# Patient Record
Sex: Female | Born: 1940 | Race: White | Hispanic: No | Smoking: Never smoker
Health system: Southern US, Community
[De-identification: ages and names within clinical notes are randomized; demographics above are authoritative.]

## PROBLEM LIST (undated history)

## (undated) DIAGNOSIS — E538 Deficiency of other specified B group vitamins: Secondary | ICD-10-CM

## (undated) DIAGNOSIS — C801 Malignant (primary) neoplasm, unspecified: Secondary | ICD-10-CM

## (undated) DIAGNOSIS — C50919 Malignant neoplasm of unspecified site of unspecified female breast: Principal | ICD-10-CM

## (undated) DIAGNOSIS — C7951 Secondary malignant neoplasm of bone: Secondary | ICD-10-CM

## (undated) HISTORY — DX: Malignant neoplasm of unspecified site of unspecified female breast: C50.919

## (undated) HISTORY — DX: Secondary malignant neoplasm of bone: C79.51

## (undated) HISTORY — DX: Deficiency of other specified B group vitamins: E53.8

---

## 2005-06-16 ENCOUNTER — Encounter: Admission: RE | Admit: 2005-06-16 | Discharge: 2005-06-16 | Payer: Self-pay | Admitting: Hematology and Oncology

## 2005-07-18 ENCOUNTER — Ambulatory Visit: Admission: RE | Admit: 2005-07-18 | Discharge: 2005-08-15 | Payer: Self-pay | Admitting: Radiation Oncology

## 2005-11-11 ENCOUNTER — Ambulatory Visit: Admission: RE | Admit: 2005-11-11 | Discharge: 2006-01-03 | Payer: Self-pay | Admitting: Radiation Oncology

## 2011-01-06 ENCOUNTER — Other Ambulatory Visit (HOSPITAL_COMMUNITY): Payer: Self-pay | Admitting: Hematology and Oncology

## 2011-01-06 DIAGNOSIS — C50919 Malignant neoplasm of unspecified site of unspecified female breast: Secondary | ICD-10-CM

## 2011-01-20 ENCOUNTER — Encounter (HOSPITAL_COMMUNITY): Payer: Self-pay

## 2011-01-20 ENCOUNTER — Other Ambulatory Visit (HOSPITAL_COMMUNITY): Payer: Self-pay | Admitting: Hematology and Oncology

## 2011-01-20 ENCOUNTER — Encounter (HOSPITAL_COMMUNITY)
Admission: RE | Admit: 2011-01-20 | Discharge: 2011-01-20 | Disposition: A | Discharge status: Home / Self Care | Payer: Medicare Other | Source: Ambulatory Visit | Attending: Hematology and Oncology | Admitting: Hematology and Oncology

## 2011-01-20 DIAGNOSIS — Z9221 Personal history of antineoplastic chemotherapy: Secondary | ICD-10-CM | POA: Insufficient documentation

## 2011-01-20 DIAGNOSIS — C7951 Secondary malignant neoplasm of bone: Secondary | ICD-10-CM | POA: Insufficient documentation

## 2011-01-20 DIAGNOSIS — Z923 Personal history of irradiation: Secondary | ICD-10-CM | POA: Insufficient documentation

## 2011-01-20 DIAGNOSIS — C7952 Secondary malignant neoplasm of bone marrow: Secondary | ICD-10-CM | POA: Insufficient documentation

## 2011-01-20 DIAGNOSIS — C50919 Malignant neoplasm of unspecified site of unspecified female breast: Secondary | ICD-10-CM

## 2011-01-20 HISTORY — DX: Malignant (primary) neoplasm, unspecified: C80.1

## 2011-01-20 MED ORDER — FLUDEOXYGLUCOSE F - 18 (FDG) INJECTION
13.0000 | Freq: Once | INTRAVENOUS | Status: AC | PRN
  Administered 2011-01-20: 13 via INTRAVENOUS

## 2011-03-16 ENCOUNTER — Other Ambulatory Visit (HOSPITAL_COMMUNITY): Payer: Self-pay | Admitting: Hematology and Oncology

## 2011-03-16 DIAGNOSIS — C50919 Malignant neoplasm of unspecified site of unspecified female breast: Secondary | ICD-10-CM

## 2011-03-18 ENCOUNTER — Ambulatory Visit (HOSPITAL_COMMUNITY)
Admission: RE | Admit: 2011-03-18 | Discharge: 2011-03-18 | Disposition: A | Discharge status: Home / Self Care | Payer: Medicare Other | Source: Ambulatory Visit | Attending: Hematology and Oncology | Admitting: Hematology and Oncology

## 2011-03-18 ENCOUNTER — Other Ambulatory Visit (HOSPITAL_COMMUNITY): Payer: Self-pay | Admitting: Hematology and Oncology

## 2011-03-18 DIAGNOSIS — C50919 Malignant neoplasm of unspecified site of unspecified female breast: Secondary | ICD-10-CM

## 2011-10-26 ENCOUNTER — Other Ambulatory Visit (HOSPITAL_COMMUNITY): Payer: Self-pay | Admitting: Hematology and Oncology

## 2011-10-26 DIAGNOSIS — IMO0002 Reserved for concepts with insufficient information to code with codable children: Secondary | ICD-10-CM

## 2011-11-01 ENCOUNTER — Ambulatory Visit (HOSPITAL_COMMUNITY)
Admission: RE | Admit: 2011-11-01 | Discharge: 2011-11-01 | Disposition: A | Discharge status: Home / Self Care | Payer: Medicare Other | Source: Ambulatory Visit | Attending: Hematology and Oncology | Admitting: Hematology and Oncology

## 2011-11-01 ENCOUNTER — Telehealth (HOSPITAL_COMMUNITY): Payer: Self-pay

## 2011-11-01 DIAGNOSIS — IMO0002 Reserved for concepts with insufficient information to code with codable children: Secondary | ICD-10-CM

## 2011-11-01 NOTE — Telephone Encounter (Signed)
Per Dr Corliss Skains have an MRI Tspine w/o contrast ordered at [facility] asap.  Pt is scheduled 11-03-11 at 1pm.  I am calling to let Debbie Reyes know.

## 2011-11-09 ENCOUNTER — Other Ambulatory Visit: Payer: Self-pay | Admitting: Physician Assistant

## 2011-11-09 ENCOUNTER — Other Ambulatory Visit (HOSPITAL_COMMUNITY): Payer: Self-pay | Admitting: Interventional Radiology

## 2011-11-09 DIAGNOSIS — IMO0002 Reserved for concepts with insufficient information to code with codable children: Secondary | ICD-10-CM

## 2011-11-11 ENCOUNTER — Ambulatory Visit (HOSPITAL_COMMUNITY)
Admission: RE | Admit: 2011-11-11 | Discharge: 2011-11-11 | Disposition: A | Discharge status: Home / Self Care | Payer: Medicare Other | Source: Ambulatory Visit | Attending: Interventional Radiology | Admitting: Interventional Radiology

## 2011-11-11 ENCOUNTER — Other Ambulatory Visit (HOSPITAL_COMMUNITY): Payer: Self-pay | Admitting: Interventional Radiology

## 2011-11-11 ENCOUNTER — Encounter (HOSPITAL_COMMUNITY): Payer: Self-pay | Admitting: Pharmacy Technician

## 2011-11-11 DIAGNOSIS — C7951 Secondary malignant neoplasm of bone: Secondary | ICD-10-CM | POA: Insufficient documentation

## 2011-11-11 DIAGNOSIS — I2510 Atherosclerotic heart disease of native coronary artery without angina pectoris: Secondary | ICD-10-CM | POA: Insufficient documentation

## 2011-11-11 DIAGNOSIS — C7952 Secondary malignant neoplasm of bone marrow: Secondary | ICD-10-CM | POA: Insufficient documentation

## 2011-11-11 DIAGNOSIS — M545 Low back pain, unspecified: Secondary | ICD-10-CM | POA: Insufficient documentation

## 2011-11-11 DIAGNOSIS — M8448XA Pathological fracture, other site, initial encounter for fracture: Secondary | ICD-10-CM | POA: Insufficient documentation

## 2011-11-11 DIAGNOSIS — E119 Type 2 diabetes mellitus without complications: Secondary | ICD-10-CM | POA: Insufficient documentation

## 2011-11-11 DIAGNOSIS — Z8673 Personal history of transient ischemic attack (TIA), and cerebral infarction without residual deficits: Secondary | ICD-10-CM | POA: Insufficient documentation

## 2011-11-11 DIAGNOSIS — J449 Chronic obstructive pulmonary disease, unspecified: Secondary | ICD-10-CM | POA: Insufficient documentation

## 2011-11-11 DIAGNOSIS — K219 Gastro-esophageal reflux disease without esophagitis: Secondary | ICD-10-CM | POA: Insufficient documentation

## 2011-11-11 DIAGNOSIS — Z853 Personal history of malignant neoplasm of breast: Secondary | ICD-10-CM | POA: Insufficient documentation

## 2011-11-11 DIAGNOSIS — IMO0002 Reserved for concepts with insufficient information to code with codable children: Secondary | ICD-10-CM

## 2011-11-11 DIAGNOSIS — J4489 Other specified chronic obstructive pulmonary disease: Secondary | ICD-10-CM | POA: Insufficient documentation

## 2011-11-11 LAB — CBC
MCH: 29.3 pg (ref 26.0–34.0)
MCV: 88.5 fL (ref 78.0–100.0)
Platelets: 273 10*3/uL (ref 150–400)
RBC: 3.82 MIL/uL — ABNORMAL LOW (ref 3.87–5.11)

## 2011-11-11 LAB — BASIC METABOLIC PANEL
CO2: 25 mEq/L (ref 19–32)
Calcium: 9.5 mg/dL (ref 8.4–10.5)
Glucose, Bld: 92 mg/dL (ref 70–99)
Sodium: 135 mEq/L (ref 135–145)

## 2011-11-11 MED ORDER — FENTANYL CITRATE 0.05 MG/ML IJ SOLN
INTRAMUSCULAR | Status: AC
  Filled 2011-11-11: qty 4

## 2011-11-11 MED ORDER — CEFAZOLIN SODIUM 1-5 GM-% IV SOLN
1.0000 g | Freq: Once | INTRAVENOUS | Status: AC
  Administered 2011-11-11: 1 g via INTRAVENOUS
  Filled 2011-11-11: qty 50

## 2011-11-11 MED ORDER — FLUMAZENIL 0.5 MG/5ML IV SOLN
INTRAVENOUS | Status: AC | PRN
  Administered 2011-11-11: .1 mg via INTRAVENOUS
  Administered 2011-11-11: 0.2 mg via INTRAVENOUS

## 2011-11-11 MED ORDER — SODIUM CHLORIDE 0.9 % IV SOLN
INTRAVENOUS | Status: DC
  Administered 2011-11-11: 07:00:00 via INTRAVENOUS

## 2011-11-11 MED ORDER — FLUMAZENIL 0.5 MG/5ML IV SOLN
INTRAVENOUS | Status: AC
  Filled 2011-11-11: qty 5

## 2011-11-11 MED ORDER — TOBRAMYCIN SULFATE 1.2 G IJ SOLR
INTRAMUSCULAR | Status: AC
  Filled 2011-11-11: qty 1.2

## 2011-11-11 MED ORDER — MIDAZOLAM HCL 2 MG/2ML IJ SOLN
INTRAMUSCULAR | Status: AC
  Filled 2011-11-11: qty 6

## 2011-11-11 MED ORDER — IOHEXOL 300 MG/ML  SOLN
50.0000 mL | Freq: Once | INTRAMUSCULAR | Status: AC | PRN
  Administered 2011-11-11: 5 mL

## 2011-11-11 MED ORDER — NALOXONE HCL 0.4 MG/ML IJ SOLN
INTRAMUSCULAR | Status: AC | PRN
  Administered 2011-11-11 (×2): 0.2 mg via INTRAVENOUS

## 2011-11-11 MED ORDER — SODIUM CHLORIDE 0.9 % IV SOLN: INTRAVENOUS | Status: AC

## 2011-11-11 MED ORDER — ONDANSETRON HCL 4 MG/2ML IJ SOLN
INTRAMUSCULAR | Status: AC
  Filled 2011-11-11: qty 2

## 2011-11-11 MED ORDER — HYDROMORPHONE HCL PF 1 MG/ML IJ SOLN
INTRAMUSCULAR | Status: AC
  Filled 2011-11-11: qty 2

## 2011-11-11 MED ORDER — ONDANSETRON HCL 4 MG/2ML IJ SOLN
INTRAMUSCULAR | Status: AC | PRN
  Administered 2011-11-11: 4 mg via INTRAVENOUS

## 2011-11-11 MED ORDER — MIDAZOLAM HCL 5 MG/5ML IJ SOLN
INTRAMUSCULAR | Status: AC | PRN
  Administered 2011-11-11 (×5): 1 mg via INTRAVENOUS

## 2011-11-11 MED ORDER — FENTANYL CITRATE 0.05 MG/ML IJ SOLN
INTRAMUSCULAR | Status: AC | PRN
  Administered 2011-11-11 (×4): 25 ug via INTRAVENOUS

## 2011-11-11 MED ORDER — HYDROMORPHONE HCL PF 1 MG/ML IJ SOLN
INTRAMUSCULAR | Status: AC | PRN
  Administered 2011-11-11 (×2): 1 mg

## 2011-11-11 MED ORDER — NALOXONE HCL 1 MG/ML IJ SOLN
INTRAMUSCULAR | Status: AC
  Filled 2011-11-11: qty 2

## 2011-11-11 NOTE — Discharge Instructions (Signed)
1. Use walker for 2 weeks 2.No stooping ,bending lifting more than 10 lbs for 2 weeks. 3. No driving for 2 weeks. 4. RTC in 2 weeks    Vertebroplasty Care After A vertebroplasty is procedure that helps relieve pain caused by breaks (fractures) in the spine. A special kind of "bone cement" is put into the spine. The cement hardens quickly. It also helps the break stay in a steady state (stabilizes).  HOME CARE  Rest in your bed (bed rest) as told by your doctor.   Do not lift things over 8 pounds (3.6 kilograms) for the next few days.   Return to your normal routine as told by your doctor.   Only take medicine as told by your doctor.   Change bandages (dressings) as told by your doctor.  GET HELP RIGHT AWAY IF:   You are bleeding from the needle site.   You have puffiness (swelling) or redness at the needle site.   You are very sore near the needle site.   Yellowish white fluid (pus) is coming out of the needle site.   You have a temperature by mouth above 102 F (38.9 C), not controlled by medicine.   You have trouble breathing or you feel short of breath.   You throw up (vomit).   You feel sick to your stomach (nauseous).   You have pain or cramping in your belly (abdomen), and it will not stop.  If you go to the Emergency Room, tell the nurse that you had this procedure done. MAKE SURE YOU:  Understand these instructions.   Will watch your condition.   Will get help right away if you are not doing well or get worse.  Document Released: 09/21/2009 Document Revised: 06/16/2011 Document Reviewed: 12/01/2010 [facility] Patient Information 2012 [employer] [place], [place].

## 2011-11-11 NOTE — H&P (Signed)
Debbie Reyes is an 71 y.o. female.   Chief Complaint: mid to low back pain HPI: Patient with remote history of breast cancer and recent finding of multiple symptomatic thoracic/lumbar compression fractures presents today for vertebroplasty/kyphoplasty/ biopsy .  Past Medical History  Diagnosis Date  . Cancer   HTN; denies DM, CAD,COPD, CVA,GERD  PSH: right breast implant, cholecystectomy, left axillary node dissection, knee surg, cataract surg, port a cath Social History: retired Film/video editor, married , lives in [place], denies alcohol/tobacco use Family History:mother died at 44 from leukemia, father died at 59 of CAD, 2 healthy siblings Allergies: No Known Allergies  Current outpatient prescriptions:aspirin EC 81 MG tablet, Take 81 mg by mouth daily., Disp: , Rfl: ;  denosumab (XGEVA) 120 MG/1.7ML SOLN, Inject 120 mg into the skin once. Pt took on 10/13/11, Disp: , Rfl: ;  HYDROcodone-acetaminophen (NORCO) 5-325 MG per tablet, Take 1 tablet by mouth every 6 (six) hours as needed. For pain, Disp: , Rfl: ;  LORazepam (ATIVAN) 1 MG tablet, Take 1 mg by mouth at bedtime as needed. For sleep, Disp: , Rfl:  megestrol (MEGACE) 40 MG tablet, Take 40 mg by mouth daily., Disp: , Rfl: ;  nadolol (CORGARD) 40 MG tablet, Take 40 mg by mouth daily., Disp: , Rfl: ;  omega-3 acid ethyl esters (LOVAZA) 1 G capsule, Take 2 g by mouth daily., Disp: , Rfl: ;  OVER THE COUNTER MEDICATION, Take 1 tablet by mouth daily. Glucosamine over the counter., Disp: , Rfl:  paclitaxel (TAXOL) 30 MG/5ML injection, Inject 30 mg into the vein once. Pt took on 10/13/11, Disp: , Rfl: ;  venlafaxine XR (EFFEXOR-XR) 75 MG 24 hr capsule, Take 75 mg by mouth daily., Disp: , Rfl:  Current facility-administered medications:0.9 %  sodium chloride infusion, , Intravenous, Continuous, Oneal Grout, MD, Last Rate: 50 mL/hr at 11/11/11 0707;  ceFAZolin (ANCEF) IVPB 1 g/50 mL premix, 1 g, Intravenous, Once, Oneal Grout, MD   Results  for orders placed during the hospital encounter of 11/11/11 (from the past 48 hour(s))  APTT     Status: Normal   Collection Time   11/11/11  7:08 AM      Component Value Range Comment   aPTT 30  24 - 37 (seconds)   BASIC METABOLIC PANEL     Status: Abnormal   Collection Time   11/11/11  7:08 AM      Component Value Range Comment   Sodium 135  135 - 145 (mEq/L)    Potassium 4.4  3.5 - 5.1 (mEq/L)    Chloride 99  96 - 112 (mEq/L)    CO2 25  19 - 32 (mEq/L)    Glucose, Bld 92  70 - 99 (mg/dL)    BUN 17  6 - 23 (mg/dL)    Creatinine, Ser 9.60  0.50 - 1.10 (mg/dL)    Calcium 9.5  8.4 - 10.5 (mg/dL)    GFR calc non Af Amer 58 (*) >90 (mL/min)    GFR calc Af Amer 67 (*) >90 (mL/min)   CBC     Status: Abnormal   Collection Time   11/11/11  7:08 AM      Component Value Range Comment   WBC 2.9 (*) 4.0 - 10.5 (K/uL)    RBC 3.82 (*) 3.87 - 5.11 (MIL/uL)    Hemoglobin 11.2 (*) 12.0 - 15.0 (g/dL)    HCT 45.4 (*) 09.8 - 46.0 (%)    MCV 88.5  78.0 -  100.0 (fL)    MCH 29.3  26.0 - 34.0 (pg)    MCHC 33.1  30.0 - 36.0 (g/dL)    RDW 15.4  00.8 - 67.6 (%)    Platelets 273  150 - 400 (K/uL)   PROTIME-INR     Status: Normal   Collection Time   11/11/11  7:08 AM      Component Value Range Comment   Prothrombin Time 14.5  11.6 - 15.2 (seconds)    INR 1.11  0.00 - 1.49     No results found.  Review of Systems  Constitutional: Negative for fever and chills.  HENT: Positive for hearing loss.   Respiratory: Positive for cough and shortness of breath.   Cardiovascular: Negative for chest pain.  Gastrointestinal: Negative for nausea, vomiting and abdominal pain.  Musculoskeletal: Positive for back pain.  Neurological: Negative for headaches.  Endo/Heme/Allergies: Does not bruise/bleed easily.    Blood pressure 123/72, pulse 83, temperature 97.4 F (36.3 C), temperature source Oral, resp. rate 18, height 5\' 11"  (1.803 m), weight 124 lb (56.246 kg), SpO2 96.00%. Physical Exam  Constitutional: She is  oriented to person, place, and time. She appears well-developed and well-nourished.  Cardiovascular: Normal rate and regular rhythm.   Respiratory: Effort normal and breath sounds normal.  GI: Soft. Bowel sounds are normal.  Musculoskeletal: Normal range of motion. She exhibits no edema.       Mild to mod paravertebral tenderness mid/low back region  Neurological: She is alert and oriented to person, place, and time.     Assessment/Plan Patient with remote history of breast cancer and multiple symptomatic thoracic/lumbar compression fractures; plan is for vertebroplasty/kyphoplasty/biopsy of most amenable fractures. Details/risks of procedure d/w pt/husband with their understanding and consent.  Paxton Kanaan,D KEVIN 11/11/2011, 8:15 AM

## 2011-11-11 NOTE — Procedures (Signed)
S/P 4 vessel arteriogram RT CFA approach. Preliminary findings 1. No aneurysms,AVM,DAVF or dissections seen 2. No stenosis . 3. Venous outflow WNLs

## 2011-11-11 NOTE — Procedures (Signed)
S/P VP at T8 with biopsy,and KP with biopsy at T11 and L1 for painful pathological fracttures.

## 2011-11-14 ENCOUNTER — Telehealth (HOSPITAL_COMMUNITY): Payer: Self-pay

## 2011-11-14 NOTE — Telephone Encounter (Signed)
Spoke with Mrs. Dunagan to let her know of her f/u appt

## 2011-11-18 ENCOUNTER — Other Ambulatory Visit (HOSPITAL_COMMUNITY): Payer: Self-pay | Admitting: Interventional Radiology

## 2011-11-18 DIAGNOSIS — IMO0002 Reserved for concepts with insufficient information to code with codable children: Secondary | ICD-10-CM

## 2011-11-21 ENCOUNTER — Encounter: Payer: Medicare Other | Admitting: Hematology and Oncology

## 2011-11-21 DIAGNOSIS — C50919 Malignant neoplasm of unspecified site of unspecified female breast: Secondary | ICD-10-CM

## 2011-11-21 DIAGNOSIS — C7951 Secondary malignant neoplasm of bone: Secondary | ICD-10-CM

## 2011-11-21 DIAGNOSIS — E785 Hyperlipidemia, unspecified: Secondary | ICD-10-CM

## 2011-11-21 DIAGNOSIS — C7952 Secondary malignant neoplasm of bone marrow: Secondary | ICD-10-CM

## 2011-11-21 DIAGNOSIS — I10 Essential (primary) hypertension: Secondary | ICD-10-CM

## 2011-11-25 ENCOUNTER — Ambulatory Visit (HOSPITAL_COMMUNITY)
Admission: RE | Admit: 2011-11-25 | Discharge: 2011-11-25 | Disposition: A | Discharge status: Home / Self Care | Payer: Medicare Other | Source: Ambulatory Visit | Attending: Interventional Radiology | Admitting: Interventional Radiology

## 2011-11-25 DIAGNOSIS — IMO0002 Reserved for concepts with insufficient information to code with codable children: Secondary | ICD-10-CM

## 2011-12-29 ENCOUNTER — Encounter: Payer: Medicare Other | Admitting: Hematology and Oncology

## 2011-12-29 DIAGNOSIS — C7951 Secondary malignant neoplasm of bone: Secondary | ICD-10-CM

## 2011-12-29 DIAGNOSIS — C7952 Secondary malignant neoplasm of bone marrow: Secondary | ICD-10-CM

## 2011-12-29 DIAGNOSIS — C50919 Malignant neoplasm of unspecified site of unspecified female breast: Secondary | ICD-10-CM

## 2012-01-02 ENCOUNTER — Encounter: Payer: Medicare Other | Admitting: Hematology and Oncology

## 2012-01-02 DIAGNOSIS — I10 Essential (primary) hypertension: Secondary | ICD-10-CM

## 2012-01-02 DIAGNOSIS — C7952 Secondary malignant neoplasm of bone marrow: Secondary | ICD-10-CM

## 2012-01-02 DIAGNOSIS — K1230 Oral mucositis (ulcerative), unspecified: Secondary | ICD-10-CM

## 2012-01-02 DIAGNOSIS — C50919 Malignant neoplasm of unspecified site of unspecified female breast: Secondary | ICD-10-CM

## 2012-01-02 DIAGNOSIS — C7951 Secondary malignant neoplasm of bone: Secondary | ICD-10-CM

## 2012-01-02 DIAGNOSIS — K121 Other forms of stomatitis: Secondary | ICD-10-CM

## 2012-01-09 ENCOUNTER — Encounter: Payer: Medicare Other | Admitting: Hematology and Oncology

## 2012-01-09 DIAGNOSIS — C50919 Malignant neoplasm of unspecified site of unspecified female breast: Secondary | ICD-10-CM

## 2012-01-09 DIAGNOSIS — Z170 Estrogen receptor positive status [ER+]: Secondary | ICD-10-CM

## 2012-01-09 DIAGNOSIS — E785 Hyperlipidemia, unspecified: Secondary | ICD-10-CM

## 2012-01-09 DIAGNOSIS — I10 Essential (primary) hypertension: Secondary | ICD-10-CM

## 2012-02-06 DIAGNOSIS — C7952 Secondary malignant neoplasm of bone marrow: Secondary | ICD-10-CM

## 2012-02-06 DIAGNOSIS — C50919 Malignant neoplasm of unspecified site of unspecified female breast: Secondary | ICD-10-CM

## 2012-02-06 DIAGNOSIS — Z452 Encounter for adjustment and management of vascular access device: Secondary | ICD-10-CM

## 2012-02-06 DIAGNOSIS — C7951 Secondary malignant neoplasm of bone: Secondary | ICD-10-CM

## 2012-02-09 ENCOUNTER — Encounter: Payer: Medicare Other | Admitting: Hematology and Oncology

## 2012-02-09 DIAGNOSIS — C50919 Malignant neoplasm of unspecified site of unspecified female breast: Secondary | ICD-10-CM

## 2012-02-09 DIAGNOSIS — C7951 Secondary malignant neoplasm of bone: Secondary | ICD-10-CM

## 2012-02-09 DIAGNOSIS — C7952 Secondary malignant neoplasm of bone marrow: Secondary | ICD-10-CM

## 2012-03-08 ENCOUNTER — Encounter: Payer: Medicare Other | Admitting: Hematology and Oncology

## 2012-03-08 DIAGNOSIS — C7952 Secondary malignant neoplasm of bone marrow: Secondary | ICD-10-CM

## 2012-03-08 DIAGNOSIS — C7951 Secondary malignant neoplasm of bone: Secondary | ICD-10-CM

## 2012-03-08 DIAGNOSIS — C50919 Malignant neoplasm of unspecified site of unspecified female breast: Secondary | ICD-10-CM

## 2012-03-13 ENCOUNTER — Encounter: Payer: Medicare Other | Admitting: Hematology and Oncology

## 2012-03-13 DIAGNOSIS — C50919 Malignant neoplasm of unspecified site of unspecified female breast: Secondary | ICD-10-CM

## 2012-04-05 ENCOUNTER — Encounter: Payer: Medicare Other | Admitting: Hematology and Oncology

## 2012-04-05 DIAGNOSIS — C7952 Secondary malignant neoplasm of bone marrow: Secondary | ICD-10-CM

## 2012-04-05 DIAGNOSIS — C50919 Malignant neoplasm of unspecified site of unspecified female breast: Secondary | ICD-10-CM

## 2012-04-05 DIAGNOSIS — C7951 Secondary malignant neoplasm of bone: Secondary | ICD-10-CM

## 2012-04-05 DIAGNOSIS — I10 Essential (primary) hypertension: Secondary | ICD-10-CM

## 2012-04-27 DIAGNOSIS — C7952 Secondary malignant neoplasm of bone marrow: Secondary | ICD-10-CM

## 2012-04-27 DIAGNOSIS — C50919 Malignant neoplasm of unspecified site of unspecified female breast: Secondary | ICD-10-CM

## 2012-04-27 DIAGNOSIS — C7951 Secondary malignant neoplasm of bone: Secondary | ICD-10-CM

## 2012-04-27 DIAGNOSIS — I10 Essential (primary) hypertension: Secondary | ICD-10-CM

## 2012-05-11 DIAGNOSIS — C7951 Secondary malignant neoplasm of bone: Secondary | ICD-10-CM

## 2012-05-11 DIAGNOSIS — C7952 Secondary malignant neoplasm of bone marrow: Secondary | ICD-10-CM

## 2012-05-11 DIAGNOSIS — C50919 Malignant neoplasm of unspecified site of unspecified female breast: Secondary | ICD-10-CM

## 2012-06-27 DIAGNOSIS — C7952 Secondary malignant neoplasm of bone marrow: Secondary | ICD-10-CM

## 2012-06-27 DIAGNOSIS — C7951 Secondary malignant neoplasm of bone: Secondary | ICD-10-CM

## 2012-06-27 DIAGNOSIS — C50919 Malignant neoplasm of unspecified site of unspecified female breast: Secondary | ICD-10-CM

## 2012-08-09 DIAGNOSIS — C7951 Secondary malignant neoplasm of bone: Secondary | ICD-10-CM

## 2012-08-09 DIAGNOSIS — C7952 Secondary malignant neoplasm of bone marrow: Secondary | ICD-10-CM

## 2012-08-09 DIAGNOSIS — C50919 Malignant neoplasm of unspecified site of unspecified female breast: Secondary | ICD-10-CM

## 2012-08-09 DIAGNOSIS — I10 Essential (primary) hypertension: Secondary | ICD-10-CM

## 2012-09-06 ENCOUNTER — Encounter: Payer: Medicare Other | Admitting: Internal Medicine

## 2012-09-06 DIAGNOSIS — D509 Iron deficiency anemia, unspecified: Secondary | ICD-10-CM

## 2012-09-06 DIAGNOSIS — C7951 Secondary malignant neoplasm of bone: Secondary | ICD-10-CM

## 2012-09-06 DIAGNOSIS — F411 Generalized anxiety disorder: Secondary | ICD-10-CM

## 2012-09-06 DIAGNOSIS — C50919 Malignant neoplasm of unspecified site of unspecified female breast: Secondary | ICD-10-CM

## 2012-09-06 DIAGNOSIS — C7952 Secondary malignant neoplasm of bone marrow: Secondary | ICD-10-CM

## 2012-10-04 DIAGNOSIS — C7952 Secondary malignant neoplasm of bone marrow: Secondary | ICD-10-CM

## 2012-10-04 DIAGNOSIS — C50919 Malignant neoplasm of unspecified site of unspecified female breast: Secondary | ICD-10-CM

## 2012-10-04 DIAGNOSIS — C7951 Secondary malignant neoplasm of bone: Secondary | ICD-10-CM

## 2012-10-19 DIAGNOSIS — C7951 Secondary malignant neoplasm of bone: Secondary | ICD-10-CM

## 2012-10-19 DIAGNOSIS — C7952 Secondary malignant neoplasm of bone marrow: Secondary | ICD-10-CM

## 2012-10-19 DIAGNOSIS — C50919 Malignant neoplasm of unspecified site of unspecified female breast: Secondary | ICD-10-CM

## 2012-12-06 DIAGNOSIS — C7951 Secondary malignant neoplasm of bone: Secondary | ICD-10-CM

## 2012-12-06 DIAGNOSIS — C50919 Malignant neoplasm of unspecified site of unspecified female breast: Secondary | ICD-10-CM

## 2012-12-06 DIAGNOSIS — C7952 Secondary malignant neoplasm of bone marrow: Secondary | ICD-10-CM

## 2012-12-06 DIAGNOSIS — C787 Secondary malignant neoplasm of liver and intrahepatic bile duct: Secondary | ICD-10-CM

## 2012-12-26 DIAGNOSIS — Z5111 Encounter for antineoplastic chemotherapy: Secondary | ICD-10-CM

## 2012-12-27 DIAGNOSIS — C50919 Malignant neoplasm of unspecified site of unspecified female breast: Secondary | ICD-10-CM

## 2012-12-27 DIAGNOSIS — C787 Secondary malignant neoplasm of liver and intrahepatic bile duct: Secondary | ICD-10-CM

## 2013-01-02 DIAGNOSIS — C7951 Secondary malignant neoplasm of bone: Secondary | ICD-10-CM

## 2013-01-02 DIAGNOSIS — C7952 Secondary malignant neoplasm of bone marrow: Secondary | ICD-10-CM

## 2013-01-02 DIAGNOSIS — C50919 Malignant neoplasm of unspecified site of unspecified female breast: Secondary | ICD-10-CM

## 2013-02-01 DIAGNOSIS — Z5111 Encounter for antineoplastic chemotherapy: Secondary | ICD-10-CM

## 2013-02-04 DIAGNOSIS — C7951 Secondary malignant neoplasm of bone: Secondary | ICD-10-CM

## 2013-02-04 DIAGNOSIS — C787 Secondary malignant neoplasm of liver and intrahepatic bile duct: Secondary | ICD-10-CM

## 2013-02-04 DIAGNOSIS — C7952 Secondary malignant neoplasm of bone marrow: Secondary | ICD-10-CM

## 2013-02-04 DIAGNOSIS — C50919 Malignant neoplasm of unspecified site of unspecified female breast: Secondary | ICD-10-CM

## 2013-03-07 DIAGNOSIS — C7951 Secondary malignant neoplasm of bone: Secondary | ICD-10-CM

## 2013-03-07 DIAGNOSIS — C50919 Malignant neoplasm of unspecified site of unspecified female breast: Secondary | ICD-10-CM

## 2013-03-07 DIAGNOSIS — C7952 Secondary malignant neoplasm of bone marrow: Secondary | ICD-10-CM

## 2013-04-08 ENCOUNTER — Other Ambulatory Visit (HOSPITAL_COMMUNITY): Payer: Self-pay | Admitting: Hematology and Oncology

## 2013-04-08 DIAGNOSIS — C7952 Secondary malignant neoplasm of bone marrow: Secondary | ICD-10-CM

## 2013-04-08 DIAGNOSIS — C50912 Malignant neoplasm of unspecified site of left female breast: Secondary | ICD-10-CM

## 2013-04-08 DIAGNOSIS — C7951 Secondary malignant neoplasm of bone: Secondary | ICD-10-CM

## 2013-04-08 DIAGNOSIS — C50919 Malignant neoplasm of unspecified site of unspecified female breast: Secondary | ICD-10-CM

## 2013-04-08 DIAGNOSIS — J90 Pleural effusion, not elsewhere classified: Secondary | ICD-10-CM

## 2013-04-22 ENCOUNTER — Encounter (HOSPITAL_COMMUNITY)
Admission: RE | Admit: 2013-04-22 | Discharge: 2013-04-22 | Disposition: A | Discharge status: Home / Self Care | Payer: Medicare Other | Source: Ambulatory Visit | Attending: Hematology and Oncology | Admitting: Hematology and Oncology

## 2013-04-22 ENCOUNTER — Other Ambulatory Visit: Payer: Self-pay | Admitting: Anesthesiology

## 2013-04-22 DIAGNOSIS — C50919 Malignant neoplasm of unspecified site of unspecified female breast: Secondary | ICD-10-CM | POA: Insufficient documentation

## 2013-04-22 DIAGNOSIS — I2510 Atherosclerotic heart disease of native coronary artery without angina pectoris: Secondary | ICD-10-CM | POA: Insufficient documentation

## 2013-04-22 DIAGNOSIS — C50912 Malignant neoplasm of unspecified site of left female breast: Secondary | ICD-10-CM

## 2013-04-22 DIAGNOSIS — R911 Solitary pulmonary nodule: Secondary | ICD-10-CM | POA: Insufficient documentation

## 2013-04-22 DIAGNOSIS — C787 Secondary malignant neoplasm of liver and intrahepatic bile duct: Secondary | ICD-10-CM | POA: Insufficient documentation

## 2013-04-22 DIAGNOSIS — J90 Pleural effusion, not elsewhere classified: Secondary | ICD-10-CM | POA: Insufficient documentation

## 2013-04-22 DIAGNOSIS — C7951 Secondary malignant neoplasm of bone: Secondary | ICD-10-CM | POA: Insufficient documentation

## 2013-04-22 LAB — GLUCOSE, CAPILLARY: Glucose-Capillary: 90 mg/dL (ref 70–99)

## 2013-04-22 MED ORDER — FLUDEOXYGLUCOSE F - 18 (FDG) INJECTION
19.5000 | Freq: Once | INTRAVENOUS | Status: AC | PRN
  Administered 2013-04-22: 19.5 via INTRAVENOUS

## 2013-05-08 DIAGNOSIS — C7952 Secondary malignant neoplasm of bone marrow: Secondary | ICD-10-CM

## 2013-05-08 DIAGNOSIS — C50919 Malignant neoplasm of unspecified site of unspecified female breast: Secondary | ICD-10-CM

## 2013-05-08 DIAGNOSIS — C7951 Secondary malignant neoplasm of bone: Secondary | ICD-10-CM

## 2013-05-22 DIAGNOSIS — C787 Secondary malignant neoplasm of liver and intrahepatic bile duct: Secondary | ICD-10-CM

## 2013-05-22 DIAGNOSIS — C7951 Secondary malignant neoplasm of bone: Secondary | ICD-10-CM

## 2013-05-22 DIAGNOSIS — C50919 Malignant neoplasm of unspecified site of unspecified female breast: Secondary | ICD-10-CM

## 2013-05-22 DIAGNOSIS — C7952 Secondary malignant neoplasm of bone marrow: Secondary | ICD-10-CM

## 2013-05-22 DIAGNOSIS — E559 Vitamin D deficiency, unspecified: Secondary | ICD-10-CM

## 2013-05-22 DIAGNOSIS — C7800 Secondary malignant neoplasm of unspecified lung: Secondary | ICD-10-CM

## 2013-06-11 DIAGNOSIS — Z5111 Encounter for antineoplastic chemotherapy: Secondary | ICD-10-CM

## 2013-06-11 DIAGNOSIS — C7952 Secondary malignant neoplasm of bone marrow: Secondary | ICD-10-CM

## 2013-06-11 DIAGNOSIS — C50919 Malignant neoplasm of unspecified site of unspecified female breast: Secondary | ICD-10-CM

## 2013-06-11 DIAGNOSIS — C7951 Secondary malignant neoplasm of bone: Secondary | ICD-10-CM

## 2013-06-17 DIAGNOSIS — C7952 Secondary malignant neoplasm of bone marrow: Secondary | ICD-10-CM

## 2013-06-17 DIAGNOSIS — C787 Secondary malignant neoplasm of liver and intrahepatic bile duct: Secondary | ICD-10-CM

## 2013-06-17 DIAGNOSIS — C50919 Malignant neoplasm of unspecified site of unspecified female breast: Secondary | ICD-10-CM

## 2013-06-17 DIAGNOSIS — C7800 Secondary malignant neoplasm of unspecified lung: Secondary | ICD-10-CM

## 2013-06-17 DIAGNOSIS — Z5111 Encounter for antineoplastic chemotherapy: Secondary | ICD-10-CM

## 2013-06-17 DIAGNOSIS — R5383 Other fatigue: Secondary | ICD-10-CM

## 2013-06-17 DIAGNOSIS — R5381 Other malaise: Secondary | ICD-10-CM

## 2013-06-17 DIAGNOSIS — C7951 Secondary malignant neoplasm of bone: Secondary | ICD-10-CM

## 2014-01-23 ENCOUNTER — Other Ambulatory Visit (HOSPITAL_COMMUNITY): Payer: Self-pay | Admitting: Oncology

## 2014-01-23 DIAGNOSIS — C7951 Secondary malignant neoplasm of bone: Secondary | ICD-10-CM

## 2014-01-23 DIAGNOSIS — C7952 Secondary malignant neoplasm of bone marrow: Secondary | ICD-10-CM

## 2014-01-23 DIAGNOSIS — C50919 Malignant neoplasm of unspecified site of unspecified female breast: Secondary | ICD-10-CM

## 2014-01-31 ENCOUNTER — Encounter (HOSPITAL_COMMUNITY)
Admission: RE | Admit: 2014-01-31 | Discharge: 2014-01-31 | Disposition: A | Discharge status: Home / Self Care | Payer: Medicare Other | Source: Ambulatory Visit | Attending: Oncology | Admitting: Oncology

## 2014-01-31 ENCOUNTER — Encounter (HOSPITAL_COMMUNITY): Payer: Self-pay

## 2014-01-31 DIAGNOSIS — C50919 Malignant neoplasm of unspecified site of unspecified female breast: Secondary | ICD-10-CM | POA: Diagnosis present

## 2014-01-31 DIAGNOSIS — C7952 Secondary malignant neoplasm of bone marrow: Secondary | ICD-10-CM | POA: Diagnosis present

## 2014-01-31 DIAGNOSIS — C7951 Secondary malignant neoplasm of bone: Secondary | ICD-10-CM | POA: Insufficient documentation

## 2014-01-31 LAB — GLUCOSE, CAPILLARY: GLUCOSE-CAPILLARY: 107 mg/dL — AB (ref 70–99)

## 2014-01-31 MED ORDER — FLUDEOXYGLUCOSE F - 18 (FDG) INJECTION
5.8000 | Freq: Once | INTRAVENOUS | Status: AC | PRN
  Administered 2014-01-31: 5.8 via INTRAVENOUS

## 2014-01-31 MED ORDER — FLUDEOXYGLUCOSE F - 18 (FDG) INJECTION: 5.8000 | Freq: Once | INTRAVENOUS | Status: AC | PRN

## 2014-05-28 ENCOUNTER — Other Ambulatory Visit (HOSPITAL_COMMUNITY): Payer: Self-pay | Admitting: Oncology

## 2014-05-28 DIAGNOSIS — C50919 Malignant neoplasm of unspecified site of unspecified female breast: Secondary | ICD-10-CM

## 2014-06-11 ENCOUNTER — Ambulatory Visit (HOSPITAL_COMMUNITY)
Admission: RE | Admit: 2014-06-11 | Discharge: 2014-06-11 | Disposition: A | Discharge status: Home / Self Care | Payer: Medicare Other | Source: Ambulatory Visit | Attending: Oncology | Admitting: Oncology

## 2014-06-11 DIAGNOSIS — J90 Pleural effusion, not elsewhere classified: Secondary | ICD-10-CM | POA: Insufficient documentation

## 2014-06-11 DIAGNOSIS — I2510 Atherosclerotic heart disease of native coronary artery without angina pectoris: Secondary | ICD-10-CM | POA: Diagnosis not present

## 2014-06-11 DIAGNOSIS — Z923 Personal history of irradiation: Secondary | ICD-10-CM | POA: Insufficient documentation

## 2014-06-11 DIAGNOSIS — I700 Atherosclerosis of aorta: Secondary | ICD-10-CM | POA: Insufficient documentation

## 2014-06-11 DIAGNOSIS — R599 Enlarged lymph nodes, unspecified: Secondary | ICD-10-CM | POA: Diagnosis not present

## 2014-06-11 DIAGNOSIS — Z9049 Acquired absence of other specified parts of digestive tract: Secondary | ICD-10-CM | POA: Diagnosis not present

## 2014-06-11 DIAGNOSIS — C50919 Malignant neoplasm of unspecified site of unspecified female breast: Secondary | ICD-10-CM | POA: Insufficient documentation

## 2014-06-11 DIAGNOSIS — C7951 Secondary malignant neoplasm of bone: Secondary | ICD-10-CM | POA: Insufficient documentation

## 2014-06-11 DIAGNOSIS — C787 Secondary malignant neoplasm of liver and intrahepatic bile duct: Secondary | ICD-10-CM | POA: Diagnosis not present

## 2014-06-11 DIAGNOSIS — K5710 Diverticulosis of small intestine without perforation or abscess without bleeding: Secondary | ICD-10-CM | POA: Insufficient documentation

## 2014-06-11 DIAGNOSIS — Z9011 Acquired absence of right breast and nipple: Secondary | ICD-10-CM | POA: Diagnosis not present

## 2014-06-11 DIAGNOSIS — C7972 Secondary malignant neoplasm of left adrenal gland: Secondary | ICD-10-CM | POA: Insufficient documentation

## 2014-06-11 LAB — GLUCOSE, CAPILLARY: GLUCOSE-CAPILLARY: 98 mg/dL (ref 70–99)

## 2014-06-11 MED ORDER — FLUDEOXYGLUCOSE F - 18 (FDG) INJECTION
5.7300 | Freq: Once | INTRAVENOUS | Status: AC | PRN
  Administered 2014-06-11: 5.73 via INTRAVENOUS

## 2014-07-23 DIAGNOSIS — C50919 Malignant neoplasm of unspecified site of unspecified female breast: Secondary | ICD-10-CM | POA: Diagnosis not present

## 2014-07-23 DIAGNOSIS — C7951 Secondary malignant neoplasm of bone: Secondary | ICD-10-CM | POA: Diagnosis not present

## 2014-08-20 DIAGNOSIS — C7951 Secondary malignant neoplasm of bone: Secondary | ICD-10-CM | POA: Diagnosis not present

## 2014-08-20 DIAGNOSIS — C50919 Malignant neoplasm of unspecified site of unspecified female breast: Secondary | ICD-10-CM | POA: Diagnosis not present

## 2014-08-20 DIAGNOSIS — Z95828 Presence of other vascular implants and grafts: Secondary | ICD-10-CM | POA: Diagnosis not present

## 2014-09-09 DIAGNOSIS — C50919 Malignant neoplasm of unspecified site of unspecified female breast: Secondary | ICD-10-CM | POA: Diagnosis not present

## 2014-09-09 DIAGNOSIS — Z95828 Presence of other vascular implants and grafts: Secondary | ICD-10-CM | POA: Diagnosis not present

## 2014-09-09 DIAGNOSIS — F329 Major depressive disorder, single episode, unspecified: Secondary | ICD-10-CM | POA: Diagnosis not present

## 2014-09-17 DIAGNOSIS — C50919 Malignant neoplasm of unspecified site of unspecified female breast: Secondary | ICD-10-CM | POA: Diagnosis not present

## 2014-09-17 DIAGNOSIS — C50912 Malignant neoplasm of unspecified site of left female breast: Secondary | ICD-10-CM | POA: Diagnosis not present

## 2014-09-17 DIAGNOSIS — C799 Secondary malignant neoplasm of unspecified site: Secondary | ICD-10-CM | POA: Diagnosis not present

## 2014-09-17 DIAGNOSIS — M7989 Other specified soft tissue disorders: Secondary | ICD-10-CM | POA: Diagnosis not present

## 2014-09-22 DIAGNOSIS — I890 Lymphedema, not elsewhere classified: Secondary | ICD-10-CM | POA: Diagnosis not present

## 2014-09-22 DIAGNOSIS — C7951 Secondary malignant neoplasm of bone: Secondary | ICD-10-CM | POA: Diagnosis not present

## 2014-09-22 DIAGNOSIS — C50912 Malignant neoplasm of unspecified site of left female breast: Secondary | ICD-10-CM | POA: Diagnosis not present

## 2014-09-22 DIAGNOSIS — M7989 Other specified soft tissue disorders: Secondary | ICD-10-CM | POA: Diagnosis not present

## 2014-09-22 DIAGNOSIS — F329 Major depressive disorder, single episode, unspecified: Secondary | ICD-10-CM | POA: Diagnosis not present

## 2014-09-23 DIAGNOSIS — I890 Lymphedema, not elsewhere classified: Secondary | ICD-10-CM | POA: Diagnosis not present

## 2014-09-24 DIAGNOSIS — C50912 Malignant neoplasm of unspecified site of left female breast: Secondary | ICD-10-CM | POA: Diagnosis not present

## 2014-09-24 DIAGNOSIS — M7989 Other specified soft tissue disorders: Secondary | ICD-10-CM | POA: Diagnosis not present

## 2014-09-24 DIAGNOSIS — I890 Lymphedema, not elsewhere classified: Secondary | ICD-10-CM | POA: Diagnosis not present

## 2014-09-26 DIAGNOSIS — I890 Lymphedema, not elsewhere classified: Secondary | ICD-10-CM | POA: Diagnosis not present

## 2014-09-29 DIAGNOSIS — I890 Lymphedema, not elsewhere classified: Secondary | ICD-10-CM | POA: Diagnosis not present

## 2014-10-01 DIAGNOSIS — I890 Lymphedema, not elsewhere classified: Secondary | ICD-10-CM | POA: Diagnosis not present

## 2014-10-02 DIAGNOSIS — I890 Lymphedema, not elsewhere classified: Secondary | ICD-10-CM | POA: Diagnosis not present

## 2014-10-06 DIAGNOSIS — I890 Lymphedema, not elsewhere classified: Secondary | ICD-10-CM | POA: Diagnosis not present

## 2014-10-08 DIAGNOSIS — I890 Lymphedema, not elsewhere classified: Secondary | ICD-10-CM | POA: Diagnosis not present

## 2014-10-10 DIAGNOSIS — I890 Lymphedema, not elsewhere classified: Secondary | ICD-10-CM | POA: Diagnosis not present

## 2014-10-14 DIAGNOSIS — I890 Lymphedema, not elsewhere classified: Secondary | ICD-10-CM | POA: Diagnosis not present

## 2014-10-15 DIAGNOSIS — I890 Lymphedema, not elsewhere classified: Secondary | ICD-10-CM | POA: Diagnosis not present

## 2014-10-20 DIAGNOSIS — I890 Lymphedema, not elsewhere classified: Secondary | ICD-10-CM | POA: Diagnosis not present

## 2014-10-22 DIAGNOSIS — I890 Lymphedema, not elsewhere classified: Secondary | ICD-10-CM | POA: Diagnosis not present

## 2014-10-23 DIAGNOSIS — I890 Lymphedema, not elsewhere classified: Secondary | ICD-10-CM | POA: Diagnosis not present

## 2014-10-27 DIAGNOSIS — I890 Lymphedema, not elsewhere classified: Secondary | ICD-10-CM | POA: Diagnosis not present

## 2014-10-29 DIAGNOSIS — I890 Lymphedema, not elsewhere classified: Secondary | ICD-10-CM | POA: Diagnosis not present

## 2014-10-31 DIAGNOSIS — I890 Lymphedema, not elsewhere classified: Secondary | ICD-10-CM | POA: Diagnosis not present

## 2014-11-03 DIAGNOSIS — C7951 Secondary malignant neoplasm of bone: Secondary | ICD-10-CM | POA: Diagnosis not present

## 2014-11-03 DIAGNOSIS — C50912 Malignant neoplasm of unspecified site of left female breast: Secondary | ICD-10-CM | POA: Diagnosis not present

## 2014-11-03 DIAGNOSIS — I890 Lymphedema, not elsewhere classified: Secondary | ICD-10-CM | POA: Diagnosis not present

## 2014-11-03 DIAGNOSIS — C50919 Malignant neoplasm of unspecified site of unspecified female breast: Secondary | ICD-10-CM | POA: Diagnosis not present

## 2014-11-05 DIAGNOSIS — I890 Lymphedema, not elsewhere classified: Secondary | ICD-10-CM | POA: Diagnosis not present

## 2014-11-07 DIAGNOSIS — I890 Lymphedema, not elsewhere classified: Secondary | ICD-10-CM | POA: Diagnosis not present

## 2014-11-11 DIAGNOSIS — I890 Lymphedema, not elsewhere classified: Secondary | ICD-10-CM | POA: Diagnosis not present

## 2014-11-17 DIAGNOSIS — C50912 Malignant neoplasm of unspecified site of left female breast: Secondary | ICD-10-CM | POA: Diagnosis not present

## 2014-11-17 DIAGNOSIS — Z95828 Presence of other vascular implants and grafts: Secondary | ICD-10-CM | POA: Diagnosis not present

## 2014-11-17 DIAGNOSIS — M7989 Other specified soft tissue disorders: Secondary | ICD-10-CM | POA: Diagnosis not present

## 2014-11-17 DIAGNOSIS — F329 Major depressive disorder, single episode, unspecified: Secondary | ICD-10-CM | POA: Diagnosis not present

## 2014-12-15 DIAGNOSIS — C7951 Secondary malignant neoplasm of bone: Secondary | ICD-10-CM | POA: Diagnosis not present

## 2014-12-15 DIAGNOSIS — C50919 Malignant neoplasm of unspecified site of unspecified female breast: Secondary | ICD-10-CM | POA: Diagnosis not present

## 2014-12-26 DIAGNOSIS — R5381 Other malaise: Secondary | ICD-10-CM | POA: Diagnosis not present

## 2014-12-26 DIAGNOSIS — W57XXXA Bitten or stung by nonvenomous insect and other nonvenomous arthropods, initial encounter: Secondary | ICD-10-CM | POA: Diagnosis not present

## 2014-12-26 DIAGNOSIS — R5383 Other fatigue: Secondary | ICD-10-CM | POA: Diagnosis not present

## 2015-01-14 DIAGNOSIS — J209 Acute bronchitis, unspecified: Secondary | ICD-10-CM | POA: Diagnosis not present

## 2015-01-14 DIAGNOSIS — C50919 Malignant neoplasm of unspecified site of unspecified female breast: Secondary | ICD-10-CM | POA: Diagnosis not present

## 2015-01-14 DIAGNOSIS — C7951 Secondary malignant neoplasm of bone: Secondary | ICD-10-CM | POA: Diagnosis not present

## 2015-01-14 DIAGNOSIS — C799 Secondary malignant neoplasm of unspecified site: Secondary | ICD-10-CM | POA: Diagnosis not present

## 2015-02-11 DIAGNOSIS — C50919 Malignant neoplasm of unspecified site of unspecified female breast: Secondary | ICD-10-CM | POA: Diagnosis not present

## 2015-02-11 DIAGNOSIS — C799 Secondary malignant neoplasm of unspecified site: Secondary | ICD-10-CM | POA: Diagnosis not present

## 2015-02-11 DIAGNOSIS — R159 Full incontinence of feces: Secondary | ICD-10-CM | POA: Diagnosis not present

## 2015-02-11 DIAGNOSIS — C7951 Secondary malignant neoplasm of bone: Secondary | ICD-10-CM | POA: Diagnosis not present

## 2015-02-11 DIAGNOSIS — R05 Cough: Secondary | ICD-10-CM | POA: Diagnosis not present

## 2015-02-12 DIAGNOSIS — R05 Cough: Secondary | ICD-10-CM | POA: Diagnosis not present

## 2015-03-11 DIAGNOSIS — C7951 Secondary malignant neoplasm of bone: Secondary | ICD-10-CM | POA: Diagnosis not present

## 2015-03-11 DIAGNOSIS — C50919 Malignant neoplasm of unspecified site of unspecified female breast: Secondary | ICD-10-CM | POA: Diagnosis not present

## 2015-03-17 DIAGNOSIS — R918 Other nonspecific abnormal finding of lung field: Secondary | ICD-10-CM | POA: Diagnosis not present

## 2015-03-17 DIAGNOSIS — Z95828 Presence of other vascular implants and grafts: Secondary | ICD-10-CM | POA: Diagnosis not present

## 2015-03-17 DIAGNOSIS — C50919 Malignant neoplasm of unspecified site of unspecified female breast: Secondary | ICD-10-CM | POA: Diagnosis not present

## 2015-03-17 DIAGNOSIS — J90 Pleural effusion, not elsewhere classified: Secondary | ICD-10-CM | POA: Diagnosis not present

## 2015-03-17 DIAGNOSIS — R0602 Shortness of breath: Secondary | ICD-10-CM | POA: Diagnosis not present

## 2015-03-20 ENCOUNTER — Encounter: Payer: Self-pay | Admitting: Oncology

## 2015-03-20 DIAGNOSIS — R05 Cough: Secondary | ICD-10-CM | POA: Diagnosis not present

## 2015-03-20 DIAGNOSIS — J90 Pleural effusion, not elsewhere classified: Secondary | ICD-10-CM | POA: Diagnosis not present

## 2015-03-20 DIAGNOSIS — R0602 Shortness of breath: Secondary | ICD-10-CM | POA: Diagnosis not present

## 2015-03-25 DIAGNOSIS — C50919 Malignant neoplasm of unspecified site of unspecified female breast: Secondary | ICD-10-CM | POA: Diagnosis not present

## 2015-03-25 DIAGNOSIS — J90 Pleural effusion, not elsewhere classified: Secondary | ICD-10-CM | POA: Diagnosis not present

## 2015-03-25 DIAGNOSIS — R0602 Shortness of breath: Secondary | ICD-10-CM | POA: Diagnosis not present

## 2015-04-08 DIAGNOSIS — C50919 Malignant neoplasm of unspecified site of unspecified female breast: Secondary | ICD-10-CM | POA: Diagnosis not present

## 2015-04-08 DIAGNOSIS — Z95828 Presence of other vascular implants and grafts: Secondary | ICD-10-CM | POA: Diagnosis not present

## 2015-04-08 DIAGNOSIS — C7951 Secondary malignant neoplasm of bone: Secondary | ICD-10-CM | POA: Diagnosis not present

## 2015-04-15 DIAGNOSIS — C50919 Malignant neoplasm of unspecified site of unspecified female breast: Secondary | ICD-10-CM | POA: Diagnosis not present

## 2015-04-15 DIAGNOSIS — Z95828 Presence of other vascular implants and grafts: Secondary | ICD-10-CM | POA: Diagnosis not present

## 2015-04-15 DIAGNOSIS — R0602 Shortness of breath: Secondary | ICD-10-CM | POA: Diagnosis not present

## 2015-04-15 DIAGNOSIS — J90 Pleural effusion, not elsewhere classified: Secondary | ICD-10-CM | POA: Diagnosis not present

## 2015-04-23 ENCOUNTER — Encounter: Payer: Self-pay | Admitting: Oncology

## 2015-04-23 DIAGNOSIS — J910 Malignant pleural effusion: Secondary | ICD-10-CM | POA: Diagnosis not present

## 2015-04-23 DIAGNOSIS — Z853 Personal history of malignant neoplasm of breast: Secondary | ICD-10-CM | POA: Diagnosis not present

## 2015-04-23 DIAGNOSIS — R0602 Shortness of breath: Secondary | ICD-10-CM | POA: Diagnosis not present

## 2015-04-23 DIAGNOSIS — Z95828 Presence of other vascular implants and grafts: Secondary | ICD-10-CM | POA: Diagnosis not present

## 2015-04-23 DIAGNOSIS — C50919 Malignant neoplasm of unspecified site of unspecified female breast: Secondary | ICD-10-CM | POA: Diagnosis not present

## 2015-04-23 DIAGNOSIS — J90 Pleural effusion, not elsewhere classified: Secondary | ICD-10-CM | POA: Diagnosis not present

## 2015-04-27 ENCOUNTER — Other Ambulatory Visit (HOSPITAL_COMMUNITY): Payer: Self-pay | Admitting: Oncology

## 2015-04-27 DIAGNOSIS — Z95828 Presence of other vascular implants and grafts: Secondary | ICD-10-CM | POA: Diagnosis not present

## 2015-04-27 DIAGNOSIS — C50919 Malignant neoplasm of unspecified site of unspecified female breast: Secondary | ICD-10-CM | POA: Diagnosis not present

## 2015-04-27 DIAGNOSIS — R948 Abnormal results of function studies of other organs and systems: Secondary | ICD-10-CM | POA: Diagnosis not present

## 2015-05-04 ENCOUNTER — Ambulatory Visit (HOSPITAL_COMMUNITY)
Admission: RE | Admit: 2015-05-04 | Discharge: 2015-05-04 | Disposition: A | Discharge status: Home / Self Care | Payer: Medicare Other | Source: Ambulatory Visit | Attending: Oncology | Admitting: Oncology

## 2015-05-04 DIAGNOSIS — J90 Pleural effusion, not elsewhere classified: Secondary | ICD-10-CM | POA: Diagnosis not present

## 2015-05-04 DIAGNOSIS — C7951 Secondary malignant neoplasm of bone: Secondary | ICD-10-CM | POA: Diagnosis not present

## 2015-05-04 DIAGNOSIS — R590 Localized enlarged lymph nodes: Secondary | ICD-10-CM | POA: Insufficient documentation

## 2015-05-04 DIAGNOSIS — C50919 Malignant neoplasm of unspecified site of unspecified female breast: Secondary | ICD-10-CM | POA: Diagnosis not present

## 2015-05-04 DIAGNOSIS — Z79899 Other long term (current) drug therapy: Secondary | ICD-10-CM | POA: Diagnosis not present

## 2015-05-04 LAB — GLUCOSE, CAPILLARY: GLUCOSE-CAPILLARY: 97 mg/dL (ref 65–99)

## 2015-05-04 MED ORDER — FLUDEOXYGLUCOSE F - 18 (FDG) INJECTION
5.8000 | Freq: Once | INTRAVENOUS | Status: DC | PRN
  Administered 2015-05-04: 5.8 via INTRAVENOUS
  Filled 2015-05-04: qty 5.8

## 2015-05-05 DIAGNOSIS — C50919 Malignant neoplasm of unspecified site of unspecified female breast: Secondary | ICD-10-CM | POA: Diagnosis not present

## 2015-05-05 DIAGNOSIS — C7951 Secondary malignant neoplasm of bone: Secondary | ICD-10-CM | POA: Diagnosis not present

## 2015-05-06 DIAGNOSIS — J90 Pleural effusion, not elsewhere classified: Secondary | ICD-10-CM | POA: Diagnosis not present

## 2015-05-06 DIAGNOSIS — C7951 Secondary malignant neoplasm of bone: Secondary | ICD-10-CM | POA: Diagnosis not present

## 2015-05-06 DIAGNOSIS — L989 Disorder of the skin and subcutaneous tissue, unspecified: Secondary | ICD-10-CM | POA: Diagnosis not present

## 2015-05-06 DIAGNOSIS — Z95828 Presence of other vascular implants and grafts: Secondary | ICD-10-CM | POA: Diagnosis not present

## 2015-05-06 DIAGNOSIS — R937 Abnormal findings on diagnostic imaging of other parts of musculoskeletal system: Secondary | ICD-10-CM | POA: Diagnosis not present

## 2015-05-06 DIAGNOSIS — Z0389 Encounter for observation for other suspected diseases and conditions ruled out: Secondary | ICD-10-CM | POA: Diagnosis not present

## 2015-05-06 DIAGNOSIS — C50919 Malignant neoplasm of unspecified site of unspecified female breast: Secondary | ICD-10-CM | POA: Diagnosis not present

## 2015-05-07 DIAGNOSIS — C50919 Malignant neoplasm of unspecified site of unspecified female breast: Secondary | ICD-10-CM | POA: Diagnosis not present

## 2015-05-07 DIAGNOSIS — C7951 Secondary malignant neoplasm of bone: Secondary | ICD-10-CM | POA: Diagnosis not present

## 2015-05-07 DIAGNOSIS — Z23 Encounter for immunization: Secondary | ICD-10-CM | POA: Diagnosis not present

## 2015-05-11 DIAGNOSIS — L821 Other seborrheic keratosis: Secondary | ICD-10-CM | POA: Diagnosis not present

## 2015-05-11 DIAGNOSIS — L820 Inflamed seborrheic keratosis: Secondary | ICD-10-CM | POA: Diagnosis not present

## 2015-05-11 DIAGNOSIS — L814 Other melanin hyperpigmentation: Secondary | ICD-10-CM | POA: Diagnosis not present

## 2015-05-11 DIAGNOSIS — D485 Neoplasm of uncertain behavior of skin: Secondary | ICD-10-CM | POA: Diagnosis not present

## 2015-05-13 DIAGNOSIS — Z95828 Presence of other vascular implants and grafts: Secondary | ICD-10-CM | POA: Diagnosis not present

## 2015-05-13 DIAGNOSIS — C50919 Malignant neoplasm of unspecified site of unspecified female breast: Secondary | ICD-10-CM | POA: Diagnosis not present

## 2015-05-13 DIAGNOSIS — Z9882 Breast implant status: Secondary | ICD-10-CM | POA: Diagnosis not present

## 2015-05-13 DIAGNOSIS — J90 Pleural effusion, not elsewhere classified: Secondary | ICD-10-CM | POA: Diagnosis not present

## 2015-06-03 DIAGNOSIS — C7981 Secondary malignant neoplasm of breast: Secondary | ICD-10-CM | POA: Diagnosis not present

## 2015-06-03 DIAGNOSIS — C7951 Secondary malignant neoplasm of bone: Secondary | ICD-10-CM | POA: Diagnosis not present

## 2015-06-03 DIAGNOSIS — J90 Pleural effusion, not elsewhere classified: Secondary | ICD-10-CM | POA: Diagnosis not present

## 2015-06-03 DIAGNOSIS — C50919 Malignant neoplasm of unspecified site of unspecified female breast: Secondary | ICD-10-CM | POA: Diagnosis not present

## 2015-06-03 DIAGNOSIS — Z95828 Presence of other vascular implants and grafts: Secondary | ICD-10-CM | POA: Diagnosis not present

## 2015-06-03 DIAGNOSIS — F329 Major depressive disorder, single episode, unspecified: Secondary | ICD-10-CM | POA: Diagnosis not present

## 2015-06-29 DIAGNOSIS — Z95828 Presence of other vascular implants and grafts: Secondary | ICD-10-CM | POA: Diagnosis not present

## 2015-06-29 DIAGNOSIS — C7951 Secondary malignant neoplasm of bone: Secondary | ICD-10-CM | POA: Diagnosis not present

## 2015-06-29 DIAGNOSIS — C50919 Malignant neoplasm of unspecified site of unspecified female breast: Secondary | ICD-10-CM | POA: Diagnosis not present

## 2015-06-29 DIAGNOSIS — C50912 Malignant neoplasm of unspecified site of left female breast: Secondary | ICD-10-CM | POA: Diagnosis not present

## 2015-06-29 DIAGNOSIS — R948 Abnormal results of function studies of other organs and systems: Secondary | ICD-10-CM | POA: Diagnosis not present

## 2015-07-22 DIAGNOSIS — Z95828 Presence of other vascular implants and grafts: Secondary | ICD-10-CM | POA: Diagnosis not present

## 2015-07-22 DIAGNOSIS — C50919 Malignant neoplasm of unspecified site of unspecified female breast: Secondary | ICD-10-CM | POA: Diagnosis not present

## 2015-07-27 ENCOUNTER — Other Ambulatory Visit (HOSPITAL_COMMUNITY): Payer: Self-pay | Admitting: Oncology

## 2015-07-27 DIAGNOSIS — C50912 Malignant neoplasm of unspecified site of left female breast: Secondary | ICD-10-CM | POA: Diagnosis not present

## 2015-07-27 DIAGNOSIS — C7951 Secondary malignant neoplasm of bone: Secondary | ICD-10-CM | POA: Diagnosis not present

## 2015-07-27 DIAGNOSIS — C50919 Malignant neoplasm of unspecified site of unspecified female breast: Secondary | ICD-10-CM

## 2015-08-14 ENCOUNTER — Encounter (HOSPITAL_COMMUNITY)
Admission: RE | Admit: 2015-08-14 | Discharge: 2015-08-14 | Disposition: A | Discharge status: Home / Self Care | Payer: Medicare Other | Source: Ambulatory Visit | Attending: Oncology | Admitting: Oncology

## 2015-08-14 DIAGNOSIS — C50919 Malignant neoplasm of unspecified site of unspecified female breast: Secondary | ICD-10-CM | POA: Insufficient documentation

## 2015-08-14 LAB — GLUCOSE, CAPILLARY: Glucose-Capillary: 49 mg/dL — ABNORMAL LOW (ref 65–99)

## 2015-08-14 MED ORDER — FLUDEOXYGLUCOSE F - 18 (FDG) INJECTION
5.8300 | Freq: Once | INTRAVENOUS | Status: AC | PRN
  Administered 2015-08-14: 5.83 via INTRAVENOUS

## 2015-08-17 DIAGNOSIS — R948 Abnormal results of function studies of other organs and systems: Secondary | ICD-10-CM | POA: Diagnosis not present

## 2015-08-17 DIAGNOSIS — J90 Pleural effusion, not elsewhere classified: Secondary | ICD-10-CM | POA: Diagnosis not present

## 2015-08-17 DIAGNOSIS — C50912 Malignant neoplasm of unspecified site of left female breast: Secondary | ICD-10-CM | POA: Diagnosis not present

## 2015-08-17 DIAGNOSIS — C7951 Secondary malignant neoplasm of bone: Secondary | ICD-10-CM | POA: Diagnosis not present

## 2015-08-21 DIAGNOSIS — R948 Abnormal results of function studies of other organs and systems: Secondary | ICD-10-CM | POA: Diagnosis not present

## 2015-08-21 DIAGNOSIS — C7951 Secondary malignant neoplasm of bone: Secondary | ICD-10-CM | POA: Diagnosis not present

## 2015-08-21 DIAGNOSIS — C50912 Malignant neoplasm of unspecified site of left female breast: Secondary | ICD-10-CM | POA: Diagnosis not present

## 2015-08-21 DIAGNOSIS — R590 Localized enlarged lymph nodes: Secondary | ICD-10-CM | POA: Diagnosis not present

## 2015-08-21 DIAGNOSIS — J910 Malignant pleural effusion: Secondary | ICD-10-CM | POA: Diagnosis not present

## 2015-08-21 DIAGNOSIS — K769 Liver disease, unspecified: Secondary | ICD-10-CM | POA: Diagnosis not present

## 2015-08-21 DIAGNOSIS — Z853 Personal history of malignant neoplasm of breast: Secondary | ICD-10-CM | POA: Diagnosis not present

## 2015-08-26 DIAGNOSIS — C50912 Malignant neoplasm of unspecified site of left female breast: Secondary | ICD-10-CM | POA: Diagnosis not present

## 2015-08-26 DIAGNOSIS — C50919 Malignant neoplasm of unspecified site of unspecified female breast: Secondary | ICD-10-CM | POA: Diagnosis not present

## 2015-08-26 DIAGNOSIS — C7951 Secondary malignant neoplasm of bone: Secondary | ICD-10-CM | POA: Diagnosis not present

## 2015-09-01 DIAGNOSIS — C50919 Malignant neoplasm of unspecified site of unspecified female breast: Secondary | ICD-10-CM | POA: Diagnosis not present

## 2015-09-01 DIAGNOSIS — C7951 Secondary malignant neoplasm of bone: Secondary | ICD-10-CM | POA: Diagnosis not present

## 2015-09-01 DIAGNOSIS — Z95828 Presence of other vascular implants and grafts: Secondary | ICD-10-CM | POA: Diagnosis not present

## 2015-09-08 DIAGNOSIS — C50919 Malignant neoplasm of unspecified site of unspecified female breast: Secondary | ICD-10-CM | POA: Diagnosis not present

## 2015-09-08 DIAGNOSIS — Z95828 Presence of other vascular implants and grafts: Secondary | ICD-10-CM | POA: Diagnosis not present

## 2015-09-14 DIAGNOSIS — Z5111 Encounter for antineoplastic chemotherapy: Secondary | ICD-10-CM | POA: Diagnosis not present

## 2015-09-14 DIAGNOSIS — C50919 Malignant neoplasm of unspecified site of unspecified female breast: Secondary | ICD-10-CM | POA: Diagnosis not present

## 2015-09-14 DIAGNOSIS — C7951 Secondary malignant neoplasm of bone: Secondary | ICD-10-CM | POA: Diagnosis not present

## 2015-09-22 DIAGNOSIS — C7951 Secondary malignant neoplasm of bone: Secondary | ICD-10-CM | POA: Diagnosis not present

## 2015-09-22 DIAGNOSIS — C50919 Malignant neoplasm of unspecified site of unspecified female breast: Secondary | ICD-10-CM | POA: Diagnosis not present

## 2015-09-23 DIAGNOSIS — C50919 Malignant neoplasm of unspecified site of unspecified female breast: Secondary | ICD-10-CM | POA: Diagnosis not present

## 2015-09-23 DIAGNOSIS — C7951 Secondary malignant neoplasm of bone: Secondary | ICD-10-CM | POA: Diagnosis not present

## 2015-09-29 DIAGNOSIS — C7951 Secondary malignant neoplasm of bone: Secondary | ICD-10-CM | POA: Diagnosis not present

## 2015-09-29 DIAGNOSIS — C50019 Malignant neoplasm of nipple and areola, unspecified female breast: Secondary | ICD-10-CM | POA: Diagnosis not present

## 2015-09-29 DIAGNOSIS — C50919 Malignant neoplasm of unspecified site of unspecified female breast: Secondary | ICD-10-CM | POA: Diagnosis not present

## 2015-09-30 DIAGNOSIS — C7951 Secondary malignant neoplasm of bone: Secondary | ICD-10-CM | POA: Diagnosis not present

## 2015-09-30 DIAGNOSIS — I10 Essential (primary) hypertension: Secondary | ICD-10-CM | POA: Diagnosis not present

## 2015-09-30 DIAGNOSIS — C50919 Malignant neoplasm of unspecified site of unspecified female breast: Secondary | ICD-10-CM | POA: Diagnosis not present

## 2015-09-30 DIAGNOSIS — F4321 Adjustment disorder with depressed mood: Secondary | ICD-10-CM | POA: Diagnosis not present

## 2015-10-06 DIAGNOSIS — C50919 Malignant neoplasm of unspecified site of unspecified female breast: Secondary | ICD-10-CM | POA: Diagnosis not present

## 2015-10-06 DIAGNOSIS — C7951 Secondary malignant neoplasm of bone: Secondary | ICD-10-CM | POA: Diagnosis not present

## 2015-10-06 DIAGNOSIS — Z95828 Presence of other vascular implants and grafts: Secondary | ICD-10-CM | POA: Diagnosis not present

## 2015-10-21 DIAGNOSIS — R5383 Other fatigue: Secondary | ICD-10-CM | POA: Diagnosis not present

## 2015-10-21 DIAGNOSIS — F329 Major depressive disorder, single episode, unspecified: Secondary | ICD-10-CM | POA: Diagnosis not present

## 2015-10-21 DIAGNOSIS — C50919 Malignant neoplasm of unspecified site of unspecified female breast: Secondary | ICD-10-CM | POA: Diagnosis not present

## 2015-10-21 DIAGNOSIS — C7951 Secondary malignant neoplasm of bone: Secondary | ICD-10-CM | POA: Diagnosis not present

## 2015-10-27 DIAGNOSIS — C50919 Malignant neoplasm of unspecified site of unspecified female breast: Secondary | ICD-10-CM | POA: Diagnosis not present

## 2015-10-27 DIAGNOSIS — Z5111 Encounter for antineoplastic chemotherapy: Secondary | ICD-10-CM | POA: Diagnosis not present

## 2015-10-27 DIAGNOSIS — C7951 Secondary malignant neoplasm of bone: Secondary | ICD-10-CM | POA: Diagnosis not present

## 2015-11-02 DIAGNOSIS — C7951 Secondary malignant neoplasm of bone: Secondary | ICD-10-CM | POA: Diagnosis not present

## 2015-11-02 DIAGNOSIS — C50919 Malignant neoplasm of unspecified site of unspecified female breast: Secondary | ICD-10-CM | POA: Diagnosis not present

## 2015-11-10 DIAGNOSIS — R0602 Shortness of breath: Secondary | ICD-10-CM | POA: Diagnosis not present

## 2015-11-10 DIAGNOSIS — D6481 Anemia due to antineoplastic chemotherapy: Secondary | ICD-10-CM | POA: Diagnosis not present

## 2015-11-10 DIAGNOSIS — Z170 Estrogen receptor positive status [ER+]: Secondary | ICD-10-CM | POA: Diagnosis not present

## 2015-11-10 DIAGNOSIS — C7951 Secondary malignant neoplasm of bone: Secondary | ICD-10-CM | POA: Diagnosis not present

## 2015-11-10 DIAGNOSIS — D649 Anemia, unspecified: Secondary | ICD-10-CM | POA: Diagnosis not present

## 2015-11-10 DIAGNOSIS — C50919 Malignant neoplasm of unspecified site of unspecified female breast: Secondary | ICD-10-CM | POA: Diagnosis not present

## 2015-11-10 DIAGNOSIS — Z95828 Presence of other vascular implants and grafts: Secondary | ICD-10-CM | POA: Diagnosis not present

## 2015-11-10 DIAGNOSIS — D701 Agranulocytosis secondary to cancer chemotherapy: Secondary | ICD-10-CM | POA: Diagnosis not present

## 2015-11-10 DIAGNOSIS — C50912 Malignant neoplasm of unspecified site of left female breast: Secondary | ICD-10-CM | POA: Diagnosis not present

## 2015-11-10 DIAGNOSIS — R531 Weakness: Secondary | ICD-10-CM | POA: Diagnosis not present

## 2015-11-10 DIAGNOSIS — T451X5A Adverse effect of antineoplastic and immunosuppressive drugs, initial encounter: Secondary | ICD-10-CM | POA: Diagnosis not present

## 2015-11-11 DIAGNOSIS — R0602 Shortness of breath: Secondary | ICD-10-CM | POA: Diagnosis not present

## 2015-11-11 DIAGNOSIS — C50919 Malignant neoplasm of unspecified site of unspecified female breast: Secondary | ICD-10-CM | POA: Diagnosis not present

## 2015-11-11 DIAGNOSIS — R531 Weakness: Secondary | ICD-10-CM | POA: Diagnosis not present

## 2015-11-11 DIAGNOSIS — D649 Anemia, unspecified: Secondary | ICD-10-CM | POA: Diagnosis not present

## 2015-11-16 DIAGNOSIS — T451X5A Adverse effect of antineoplastic and immunosuppressive drugs, initial encounter: Secondary | ICD-10-CM | POA: Diagnosis not present

## 2015-11-16 DIAGNOSIS — Z95828 Presence of other vascular implants and grafts: Secondary | ICD-10-CM | POA: Diagnosis not present

## 2015-11-16 DIAGNOSIS — D6481 Anemia due to antineoplastic chemotherapy: Secondary | ICD-10-CM | POA: Diagnosis not present

## 2015-11-16 DIAGNOSIS — C50919 Malignant neoplasm of unspecified site of unspecified female breast: Secondary | ICD-10-CM | POA: Diagnosis not present

## 2015-11-17 DIAGNOSIS — L03032 Cellulitis of left toe: Secondary | ICD-10-CM | POA: Diagnosis not present

## 2015-11-17 DIAGNOSIS — L600 Ingrowing nail: Secondary | ICD-10-CM | POA: Diagnosis not present

## 2015-11-17 DIAGNOSIS — L03031 Cellulitis of right toe: Secondary | ICD-10-CM | POA: Diagnosis not present

## 2015-11-17 DIAGNOSIS — B351 Tinea unguium: Secondary | ICD-10-CM | POA: Diagnosis not present

## 2015-11-23 ENCOUNTER — Other Ambulatory Visit: Payer: Self-pay | Admitting: Oncology

## 2015-11-23 ENCOUNTER — Other Ambulatory Visit (HOSPITAL_COMMUNITY): Payer: Self-pay | Admitting: Oncology

## 2015-11-23 DIAGNOSIS — C50919 Malignant neoplasm of unspecified site of unspecified female breast: Secondary | ICD-10-CM

## 2015-11-23 DIAGNOSIS — C7951 Secondary malignant neoplasm of bone: Secondary | ICD-10-CM

## 2015-11-24 DIAGNOSIS — C7951 Secondary malignant neoplasm of bone: Secondary | ICD-10-CM | POA: Diagnosis not present

## 2015-11-24 DIAGNOSIS — C50919 Malignant neoplasm of unspecified site of unspecified female breast: Secondary | ICD-10-CM | POA: Diagnosis not present

## 2015-11-24 DIAGNOSIS — C50912 Malignant neoplasm of unspecified site of left female breast: Secondary | ICD-10-CM | POA: Diagnosis not present

## 2015-11-24 DIAGNOSIS — Z5111 Encounter for antineoplastic chemotherapy: Secondary | ICD-10-CM | POA: Diagnosis not present

## 2015-12-01 DIAGNOSIS — C7951 Secondary malignant neoplasm of bone: Secondary | ICD-10-CM | POA: Diagnosis not present

## 2015-12-01 DIAGNOSIS — C50919 Malignant neoplasm of unspecified site of unspecified female breast: Secondary | ICD-10-CM | POA: Diagnosis not present

## 2015-12-08 DIAGNOSIS — C7951 Secondary malignant neoplasm of bone: Secondary | ICD-10-CM | POA: Diagnosis not present

## 2015-12-08 DIAGNOSIS — C50919 Malignant neoplasm of unspecified site of unspecified female breast: Secondary | ICD-10-CM | POA: Diagnosis not present

## 2015-12-15 ENCOUNTER — Encounter (HOSPITAL_COMMUNITY)
Admission: RE | Admit: 2015-12-15 | Discharge: 2015-12-15 | Disposition: A | Discharge status: Home / Self Care | Payer: Medicare Other | Source: Ambulatory Visit | Attending: Oncology | Admitting: Oncology

## 2015-12-15 DIAGNOSIS — C7951 Secondary malignant neoplasm of bone: Secondary | ICD-10-CM

## 2015-12-15 DIAGNOSIS — C50919 Malignant neoplasm of unspecified site of unspecified female breast: Secondary | ICD-10-CM | POA: Diagnosis not present

## 2015-12-15 LAB — GLUCOSE, CAPILLARY: GLUCOSE-CAPILLARY: 106 mg/dL — AB (ref 65–99)

## 2015-12-16 DIAGNOSIS — C7951 Secondary malignant neoplasm of bone: Secondary | ICD-10-CM | POA: Diagnosis not present

## 2015-12-16 DIAGNOSIS — T451X5A Adverse effect of antineoplastic and immunosuppressive drugs, initial encounter: Secondary | ICD-10-CM | POA: Diagnosis not present

## 2015-12-16 DIAGNOSIS — D701 Agranulocytosis secondary to cancer chemotherapy: Secondary | ICD-10-CM | POA: Diagnosis not present

## 2015-12-16 DIAGNOSIS — C50919 Malignant neoplasm of unspecified site of unspecified female breast: Secondary | ICD-10-CM | POA: Diagnosis not present

## 2015-12-16 DIAGNOSIS — D6481 Anemia due to antineoplastic chemotherapy: Secondary | ICD-10-CM | POA: Diagnosis not present

## 2015-12-16 DIAGNOSIS — I890 Lymphedema, not elsewhere classified: Secondary | ICD-10-CM | POA: Diagnosis not present

## 2015-12-16 DIAGNOSIS — R948 Abnormal results of function studies of other organs and systems: Secondary | ICD-10-CM | POA: Diagnosis not present

## 2015-12-22 DIAGNOSIS — C7951 Secondary malignant neoplasm of bone: Secondary | ICD-10-CM | POA: Diagnosis not present

## 2015-12-22 DIAGNOSIS — Z5111 Encounter for antineoplastic chemotherapy: Secondary | ICD-10-CM | POA: Diagnosis not present

## 2015-12-22 DIAGNOSIS — C50919 Malignant neoplasm of unspecified site of unspecified female breast: Secondary | ICD-10-CM | POA: Diagnosis not present

## 2015-12-24 DIAGNOSIS — H538 Other visual disturbances: Secondary | ICD-10-CM | POA: Diagnosis not present

## 2015-12-24 DIAGNOSIS — Z961 Presence of intraocular lens: Secondary | ICD-10-CM | POA: Diagnosis not present

## 2015-12-29 ENCOUNTER — Other Ambulatory Visit (HOSPITAL_COMMUNITY): Payer: Self-pay | Admitting: Oncology

## 2015-12-29 ENCOUNTER — Encounter (HOSPITAL_COMMUNITY): Payer: Self-pay | Admitting: Oncology

## 2015-12-29 DIAGNOSIS — C50919 Malignant neoplasm of unspecified site of unspecified female breast: Secondary | ICD-10-CM

## 2015-12-29 DIAGNOSIS — C7951 Secondary malignant neoplasm of bone: Secondary | ICD-10-CM

## 2015-12-29 DIAGNOSIS — C50912 Malignant neoplasm of unspecified site of left female breast: Secondary | ICD-10-CM | POA: Diagnosis not present

## 2015-12-29 HISTORY — DX: Secondary malignant neoplasm of bone: C79.51

## 2015-12-29 HISTORY — DX: Malignant neoplasm of unspecified site of unspecified female breast: C50.919

## 2015-12-29 MED ORDER — PALBOCICLIB 100 MG PO CAPS: 100.0000 mg | ORAL_CAPSULE | Freq: Every day | ORAL | Status: DC

## 2015-12-31 ENCOUNTER — Other Ambulatory Visit (HOSPITAL_COMMUNITY): Payer: Self-pay | Admitting: Oncology

## 2015-12-31 DIAGNOSIS — C50919 Malignant neoplasm of unspecified site of unspecified female breast: Secondary | ICD-10-CM

## 2016-01-04 ENCOUNTER — Telehealth (HOSPITAL_COMMUNITY): Payer: Self-pay | Admitting: Oncology

## 2016-01-04 NOTE — Telephone Encounter (Signed)
PC TO WHAT SP RX PT USED FOR [employer]. SHE STATED SHE USES BIOLOGICS AND SHE ONLY HAS 2 PILLS LEFT SO SHE CALLED [facility] AND  REQUESTED REFILL FROM [facility] [phone]

## 2016-01-18 ENCOUNTER — Encounter (HOSPITAL_COMMUNITY): Payer: Medicare Other | Attending: Hematology & Oncology

## 2016-01-18 ENCOUNTER — Other Ambulatory Visit (HOSPITAL_COMMUNITY): Payer: Medicare Other

## 2016-01-18 VITALS — BP 152/87 | HR 86 | Temp 97.8°F | Resp 18

## 2016-01-18 DIAGNOSIS — C50919 Malignant neoplasm of unspecified site of unspecified female breast: Secondary | ICD-10-CM | POA: Insufficient documentation

## 2016-01-18 DIAGNOSIS — C799 Secondary malignant neoplasm of unspecified site: Secondary | ICD-10-CM | POA: Insufficient documentation

## 2016-01-18 DIAGNOSIS — Z452 Encounter for adjustment and management of vascular access device: Secondary | ICD-10-CM

## 2016-01-18 DIAGNOSIS — Z95828 Presence of other vascular implants and grafts: Secondary | ICD-10-CM

## 2016-01-18 LAB — COMPREHENSIVE METABOLIC PANEL
ALBUMIN: 3.8 g/dL (ref 3.5–5.0)
ALT: 10 U/L — AB (ref 14–54)
ANION GAP: 8 (ref 5–15)
AST: 23 U/L (ref 15–41)
Alkaline Phosphatase: 52 U/L (ref 38–126)
BUN: 16 mg/dL (ref 6–20)
CHLORIDE: 100 mmol/L — AB (ref 101–111)
CO2: 26 mmol/L (ref 22–32)
Calcium: 8.8 mg/dL — ABNORMAL LOW (ref 8.9–10.3)
Creatinine, Ser: 1.06 mg/dL — ABNORMAL HIGH (ref 0.44–1.00)
GFR calc non Af Amer: 50 mL/min — ABNORMAL LOW (ref >60)
GFR, EST AFRICAN AMERICAN: 58 mL/min — AB (ref >60)
GLUCOSE: 106 mg/dL — AB (ref 65–99)
Potassium: 4.2 mmol/L (ref 3.5–5.1)
SODIUM: 134 mmol/L — AB (ref 135–145)
Total Bilirubin: 0.5 mg/dL (ref 0.3–1.2)
Total Protein: 6.9 g/dL (ref 6.5–8.1)

## 2016-01-18 LAB — CBC WITH DIFFERENTIAL/PLATELET
Basophils Absolute: 0.1 10*3/uL (ref 0.0–0.1)
Basophils Relative: 3 %
Eosinophils Absolute: 0 10*3/uL (ref 0.0–0.7)
Eosinophils Relative: 1 %
HEMATOCRIT: 28.6 % — AB (ref 36.0–46.0)
HEMOGLOBIN: 9.5 g/dL — AB (ref 12.0–15.0)
LYMPHS ABS: 0.6 10*3/uL — AB (ref 0.7–4.0)
Lymphocytes Relative: 19 %
MCH: 34.8 pg — AB (ref 26.0–34.0)
MCHC: 33.2 g/dL (ref 30.0–36.0)
MCV: 104.8 fL — AB (ref 78.0–100.0)
MONO ABS: 0.6 10*3/uL (ref 0.1–1.0)
MONOS PCT: 20 %
NEUTROS ABS: 1.8 10*3/uL (ref 1.7–7.7)
NEUTROS PCT: 57 %
Platelets: 206 10*3/uL (ref 150–400)
RBC: 2.73 MIL/uL — ABNORMAL LOW (ref 3.87–5.11)
RDW: 19.7 % — AB (ref 11.5–15.5)
WBC: 3.2 10*3/uL — ABNORMAL LOW (ref 4.0–10.5)

## 2016-01-18 MED ORDER — SODIUM CHLORIDE 0.9% FLUSH
10.0000 mL | Freq: Once | INTRAVENOUS | Status: AC
  Administered 2016-01-18: 10 mL via INTRAVENOUS

## 2016-01-18 MED ORDER — HEPARIN SOD (PORK) LOCK FLUSH 100 UNIT/ML IV SOLN
500.0000 [IU] | Freq: Once | INTRAVENOUS | Status: AC
  Administered 2016-01-18: 500 [IU] via INTRAVENOUS
  Filled 2016-01-18: qty 5

## 2016-01-18 NOTE — Patient Instructions (Signed)
[place] at [facility] Discharge Instructions  RECOMMENDATIONS MADE BY THE CONSULTANT AND ANY TEST RESULTS WILL BE SENT TO YOUR REFERRING PHYSICIAN.  Port flush with lab work today as scheduled. Return as scheduled.  Thank you for choosing [place] at [facility] to provide your oncology and hematology care.  To afford each patient quality time with our provider, please arrive at least 15 minutes before your scheduled appointment time.   Beginning January 23rd 2017 lab work for the [facility] will be done in the  Main lab at [facility] on 1st floor. If you have a lab appointment with the [facility] please come in thru the  Main Entrance and check in at the main information desk  You need to re-schedule your appointment should you arrive 10 or more minutes late.  We strive to give you quality time with our providers, and arriving late affects you and other patients whose appointments are after yours.  Also, if you no show three or more times for appointments you may be dismissed from the clinic at the providers discretion.     Again, thank you for choosing [facility].  Our hope is that these requests will decrease the amount of time that you wait before being seen by our physicians.       _____________________________________________________________  Should you have questions after your visit to [facility], please contact our office at [phone] between the hours of 8:30 a.m. and 4:30 p.m.  Voicemails left after 4:30 p.m. will not be returned until the following business day.  For prescription refill requests, have your pharmacy contact our office.         Resources For Cancer Patients and their Caregivers ? American Cancer Society: Can assist with transportation, wigs, general needs, runs Look Good Feel Better.        925-031-8208 ? Cancer Care: Provides financial assistance, online support  groups, medication/co-pay assistance.  1-800-813-HOPE (607) 195-4189) ? [place] Assists [place] cancer patients and their families through emotional , educational and financial support.  ([phone] 179-0486 ? [place] DSS Where to apply for food stamps, Medicaid and utility assistance. (229)089-6415 ? RCATS: Transportation to medical appointments. (914)661-7018 ? Social Security Administration: May apply for disability if have a Stage IV cancer. 934-217-5513 321-851-6133 ? [employer], Disability and Transit Services: Assists with nutrition, care and transit needs. [place] Support Programs: @10RELATIVEDAYS @ > Cancer Support Group  2nd Tuesday of the month 1pm-2pm, Journey Room  > Creative Journey  3rd Tuesday of the month 1130am-1pm, Journey Room  > Look Good Feel Better  1st Wednesday of the month 10am-12 noon, Journey Room (Call [place] to register [phone])

## 2016-01-18 NOTE — Progress Notes (Signed)
Debbie Reyes presented for Portacath access and flush. Proper placement of portacath confirmed by CXR. Portacath located right chest wall accessed with  H 20 needle. Good blood return present. Portacath flushed with 70ml NS and 500U/45ml Heparin and needle removed intact. Procedure without incident. Patient tolerated procedure well.

## 2016-01-19 ENCOUNTER — Other Ambulatory Visit (HOSPITAL_COMMUNITY): Payer: Medicare Other

## 2016-01-19 ENCOUNTER — Ambulatory Visit (HOSPITAL_COMMUNITY): Payer: Medicare Other

## 2016-01-19 ENCOUNTER — Encounter (HOSPITAL_BASED_OUTPATIENT_CLINIC_OR_DEPARTMENT_OTHER): Payer: Medicare Other

## 2016-01-19 VITALS — BP 135/75 | HR 87 | Temp 97.6°F | Resp 16

## 2016-01-19 DIAGNOSIS — Z5111 Encounter for antineoplastic chemotherapy: Secondary | ICD-10-CM

## 2016-01-19 DIAGNOSIS — C50919 Malignant neoplasm of unspecified site of unspecified female breast: Secondary | ICD-10-CM | POA: Diagnosis not present

## 2016-01-19 DIAGNOSIS — C7951 Secondary malignant neoplasm of bone: Secondary | ICD-10-CM

## 2016-01-19 LAB — CANCER ANTIGEN 27.29: CA 27.29: 392.4 U/mL — AB (ref 0.0–38.6)

## 2016-01-19 LAB — CANCER ANTIGEN 15-3: CAN 15 3: 451.9 U/mL — AB (ref 0.0–25.0)

## 2016-01-19 MED ORDER — DENOSUMAB 120 MG/1.7ML ~~LOC~~ SOLN
120.0000 mg | Freq: Once | SUBCUTANEOUS | Status: AC
  Administered 2016-01-19: 120 mg via SUBCUTANEOUS
  Filled 2016-01-19: qty 1.7

## 2016-01-19 MED ORDER — FULVESTRANT 250 MG/5ML IM SOLN
500.0000 mg | INTRAMUSCULAR | Status: DC
  Administered 2016-01-19: 500 mg via INTRAMUSCULAR
  Filled 2016-01-19: qty 10

## 2016-01-19 NOTE — Patient Instructions (Addendum)
[place] at [facility] Discharge Instructions  RECOMMENDATIONS MADE BY THE CONSULTANT AND ANY TEST RESULTS WILL BE SENT TO YOUR REFERRING PHYSICIAN.  Recommend calcium 1200 to 1500 mg daily and vitamin D 1000 i.u. Daily. Today you received an Xgeva injection and a Fasolex injection.  Thank you for choosing [place] at [facility] to provide your oncology and hematology care.  To afford each patient quality time with our provider, please arrive at least 15 minutes before your scheduled appointment time.   Beginning January 23rd 2017 lab work for the [facility] will be done in the  Main lab at [facility] on 1st floor. If you have a lab appointment with the [facility] please come in thru the  Main Entrance and check in at the main information desk  You need to re-schedule your appointment should you arrive 10 or more minutes late.  We strive to give you quality time with our providers, and arriving late affects you and other patients whose appointments are after yours.  Also, if you no show three or more times for appointments you may be dismissed from the clinic at the providers discretion.     Again, thank you for choosing [facility].  Our hope is that these requests will decrease the amount of time that you wait before being seen by our physicians.       _____________________________________________________________  Should you have questions after your visit to [facility], please contact our office at [phone] between the hours of 8:30 a.m. and 4:30 p.m.  Voicemails left after 4:30 p.m. will not be returned until the following business day.  For prescription refill requests, have your pharmacy contact our office.         Resources For Cancer Patients and their Caregivers ? American Cancer Society: Can assist with transportation, wigs, general needs, runs Look Good Feel Better.         9713631600 ? Cancer Care: Provides financial assistance, online support groups, medication/co-pay assistance.  1-800-813-HOPE 304 035 4280) ? [place] Assists [place] cancer patients and their families through emotional , educational and financial support.  640-397-4880 ? [place] DSS Where to apply for food stamps, Medicaid and utility assistance. (316)134-4439 ? RCATS: Transportation to medical appointments. 989-618-0631 ? Social Security Administration: May apply for disability if have a Stage IV cancer. [phone])508-5982 (747) 187-5999 ? [employer], Disability and Transit Services: Assists with nutrition, care and transit needs. [place] Support Programs: @10RELATIVEDAYS @ > Cancer Support Group  2nd Tuesday of the month 1pm-2pm, Journey Room  > Creative Journey  3rd Tuesday of the month 1130am-1pm, Journey Room  > Look Good Feel Better  1st Wednesday of the month 10am-12 noon, Journey Room (Call [place] to register [phone])

## 2016-01-19 NOTE — Progress Notes (Signed)
Debbie Reyes presents today for injection per MD orders. Xgeva 120 mg administered SQ in right Upper Arm. Administration without incident. Patient tolerated well.  Debbie Reyes presents today for injection per MD orders. Faslodex 250 mg/58ml administered IM in left and right upper outer Gluteal. Administration without incident. Patient tolerated well.

## 2016-01-26 ENCOUNTER — Telehealth (HOSPITAL_COMMUNITY): Payer: Self-pay

## 2016-01-26 ENCOUNTER — Other Ambulatory Visit (HOSPITAL_COMMUNITY): Payer: Self-pay

## 2016-01-26 DIAGNOSIS — C50919 Malignant neoplasm of unspecified site of unspecified female breast: Secondary | ICD-10-CM

## 2016-01-26 NOTE — Telephone Encounter (Signed)
Pt called concerned that her labs are not weekly while she is taking her [facility] which is what Dr. Tressie Stalker did at the [facility] clinic. Spoke with Dr. Whitney Muse regarding pt's concern and she approved for pt to have labs drawn weekly per lab. Pt was notified of lab appt times and verbalized understanding

## 2016-01-27 ENCOUNTER — Encounter (HOSPITAL_COMMUNITY): Payer: Medicare Other

## 2016-01-27 DIAGNOSIS — C799 Secondary malignant neoplasm of unspecified site: Secondary | ICD-10-CM | POA: Diagnosis not present

## 2016-01-27 DIAGNOSIS — C50919 Malignant neoplasm of unspecified site of unspecified female breast: Secondary | ICD-10-CM | POA: Diagnosis not present

## 2016-01-27 LAB — CBC WITH DIFFERENTIAL/PLATELET
BASOS ABS: 0.1 10*3/uL (ref 0.0–0.1)
BASOS PCT: 2 %
EOS PCT: 2 %
Eosinophils Absolute: 0.1 10*3/uL (ref 0.0–0.7)
HEMATOCRIT: 29.1 % — AB (ref 36.0–46.0)
Hemoglobin: 9.8 g/dL — ABNORMAL LOW (ref 12.0–15.0)
Lymphocytes Relative: 21 %
Lymphs Abs: 0.6 10*3/uL — ABNORMAL LOW (ref 0.7–4.0)
MCH: 35.3 pg — ABNORMAL HIGH (ref 26.0–34.0)
MCHC: 33.7 g/dL (ref 30.0–36.0)
MCV: 104.7 fL — AB (ref 78.0–100.0)
MONO ABS: 0.3 10*3/uL (ref 0.1–1.0)
MONOS PCT: 11 %
Neutro Abs: 1.8 10*3/uL (ref 1.7–7.7)
Neutrophils Relative %: 64 %
PLATELETS: 190 10*3/uL (ref 150–400)
RBC: 2.78 MIL/uL — ABNORMAL LOW (ref 3.87–5.11)
RDW: 18.8 % — AB (ref 11.5–15.5)
WBC: 2.7 10*3/uL — ABNORMAL LOW (ref 4.0–10.5)

## 2016-02-01 ENCOUNTER — Other Ambulatory Visit (HOSPITAL_COMMUNITY): Payer: Self-pay | Admitting: Oncology

## 2016-02-01 ENCOUNTER — Telehealth (HOSPITAL_COMMUNITY): Payer: Self-pay | Admitting: *Deleted

## 2016-02-01 DIAGNOSIS — C50919 Malignant neoplasm of unspecified site of unspecified female breast: Secondary | ICD-10-CM

## 2016-02-01 MED ORDER — HYDROCODONE-ACETAMINOPHEN 5-325 MG PO TABS: 1.0000 | ORAL_TABLET | Freq: Four times a day (QID) | ORAL | 0 refills | Status: DC | PRN

## 2016-02-01 NOTE — Telephone Encounter (Signed)
Rx printed  Doy Mince 02/01/2016 3:25 PM

## 2016-02-02 ENCOUNTER — Encounter (HOSPITAL_COMMUNITY): Payer: Medicare Other

## 2016-02-02 DIAGNOSIS — C799 Secondary malignant neoplasm of unspecified site: Secondary | ICD-10-CM | POA: Diagnosis not present

## 2016-02-02 DIAGNOSIS — C50919 Malignant neoplasm of unspecified site of unspecified female breast: Secondary | ICD-10-CM

## 2016-02-02 LAB — CBC WITH DIFFERENTIAL/PLATELET
BASOS ABS: 0 10*3/uL (ref 0.0–0.1)
Basophils Relative: 1 %
Eosinophils Absolute: 0 10*3/uL (ref 0.0–0.7)
Eosinophils Relative: 2 %
HEMATOCRIT: 28.7 % — AB (ref 36.0–46.0)
Hemoglobin: 9.6 g/dL — ABNORMAL LOW (ref 12.0–15.0)
LYMPHS PCT: 26 %
Lymphs Abs: 0.6 10*3/uL — ABNORMAL LOW (ref 0.7–4.0)
MCH: 35.7 pg — ABNORMAL HIGH (ref 26.0–34.0)
MCHC: 33.4 g/dL (ref 30.0–36.0)
MCV: 106.7 fL — AB (ref 78.0–100.0)
Monocytes Absolute: 0.3 10*3/uL (ref 0.1–1.0)
Monocytes Relative: 11 %
NEUTROS ABS: 1.4 10*3/uL — AB (ref 1.7–7.7)
NEUTROS PCT: 60 %
PLATELETS: 176 10*3/uL (ref 150–400)
RBC: 2.69 MIL/uL — AB (ref 3.87–5.11)
RDW: 18.6 % — ABNORMAL HIGH (ref 11.5–15.5)
WBC: 2.3 10*3/uL — AB (ref 4.0–10.5)

## 2016-02-04 ENCOUNTER — Other Ambulatory Visit (HOSPITAL_COMMUNITY): Payer: Self-pay

## 2016-02-04 DIAGNOSIS — C7951 Secondary malignant neoplasm of bone: Secondary | ICD-10-CM

## 2016-02-04 DIAGNOSIS — C50919 Malignant neoplasm of unspecified site of unspecified female breast: Secondary | ICD-10-CM

## 2016-02-09 ENCOUNTER — Encounter (HOSPITAL_COMMUNITY): Payer: Medicare Other | Attending: Hematology & Oncology

## 2016-02-09 DIAGNOSIS — C50919 Malignant neoplasm of unspecified site of unspecified female breast: Secondary | ICD-10-CM | POA: Diagnosis not present

## 2016-02-09 DIAGNOSIS — C799 Secondary malignant neoplasm of unspecified site: Secondary | ICD-10-CM | POA: Insufficient documentation

## 2016-02-09 LAB — CBC WITH DIFFERENTIAL/PLATELET
Basophils Absolute: 0.1 10*3/uL (ref 0.0–0.1)
Basophils Relative: 2 %
Eosinophils Absolute: 0 10*3/uL (ref 0.0–0.7)
Eosinophils Relative: 1 %
HEMATOCRIT: 27.2 % — AB (ref 36.0–46.0)
HEMOGLOBIN: 9.3 g/dL — AB (ref 12.0–15.0)
LYMPHS ABS: 0.7 10*3/uL (ref 0.7–4.0)
LYMPHS PCT: 24 %
MCH: 36.3 pg — AB (ref 26.0–34.0)
MCHC: 34.2 g/dL (ref 30.0–36.0)
MCV: 106.3 fL — AB (ref 78.0–100.0)
MONO ABS: 0.5 10*3/uL (ref 0.1–1.0)
MONOS PCT: 17 %
NEUTROS ABS: 1.7 10*3/uL (ref 1.7–7.7)
NEUTROS PCT: 56 %
Platelets: 137 10*3/uL — ABNORMAL LOW (ref 150–400)
RBC: 2.56 MIL/uL — ABNORMAL LOW (ref 3.87–5.11)
RDW: 18.3 % — AB (ref 11.5–15.5)
WBC: 3 10*3/uL — ABNORMAL LOW (ref 4.0–10.5)

## 2016-02-11 ENCOUNTER — Other Ambulatory Visit (HOSPITAL_COMMUNITY): Payer: Self-pay | Admitting: Oncology

## 2016-02-11 DIAGNOSIS — C50919 Malignant neoplasm of unspecified site of unspecified female breast: Secondary | ICD-10-CM

## 2016-02-11 MED ORDER — PALBOCICLIB 100 MG PO CAPS: 100.0000 mg | ORAL_CAPSULE | Freq: Every day | ORAL | 1 refills | Status: DC

## 2016-02-12 ENCOUNTER — Encounter (HOSPITAL_BASED_OUTPATIENT_CLINIC_OR_DEPARTMENT_OTHER): Payer: Medicare Other | Admitting: Hematology

## 2016-02-12 ENCOUNTER — Encounter (HOSPITAL_COMMUNITY): Payer: Self-pay | Admitting: Hematology

## 2016-02-12 VITALS — BP 130/67 | HR 77 | Temp 97.6°F | Resp 18 | Ht 60.0 in | Wt 107.8 lb

## 2016-02-12 DIAGNOSIS — C787 Secondary malignant neoplasm of liver and intrahepatic bile duct: Secondary | ICD-10-CM | POA: Diagnosis not present

## 2016-02-12 DIAGNOSIS — C7951 Secondary malignant neoplasm of bone: Secondary | ICD-10-CM | POA: Diagnosis not present

## 2016-02-12 DIAGNOSIS — Z170 Estrogen receptor positive status [ER+]: Secondary | ICD-10-CM

## 2016-02-12 DIAGNOSIS — C50612 Malignant neoplasm of axillary tail of left female breast: Secondary | ICD-10-CM | POA: Diagnosis not present

## 2016-02-12 DIAGNOSIS — D6959 Other secondary thrombocytopenia: Secondary | ICD-10-CM | POA: Diagnosis not present

## 2016-02-12 DIAGNOSIS — D649 Anemia, unspecified: Secondary | ICD-10-CM | POA: Diagnosis not present

## 2016-02-12 DIAGNOSIS — D72819 Decreased white blood cell count, unspecified: Secondary | ICD-10-CM

## 2016-02-12 DIAGNOSIS — C50919 Malignant neoplasm of unspecified site of unspecified female breast: Secondary | ICD-10-CM

## 2016-02-12 NOTE — Patient Instructions (Signed)
[place] at [facility] Discharge Instructions  RECOMMENDATIONS MADE BY THE CONSULTANT AND ANY TEST RESULTS WILL BE SENT TO YOUR REFERRING PHYSICIAN.  You were seen by Dr Irene Limbo today.   Continue Xgeva/Faslodex treatments as scheduled. Port flush next week on 02/17/16 as scheduled.   Return to clinic in 4 weeks for follow-up appointment and lab work.   Call with any questions or concerns.   Thank you for choosing [place] at [facility] to provide your oncology and hematology care.  To afford each patient quality time with our provider, please arrive at least 15 minutes before your scheduled appointment time.   Beginning January 23rd 2017 lab work for the [facility] will be done in the  Main lab at [facility] on 1st floor. If you have a lab appointment with the [facility] please come in thru the  Main Entrance and check in at the main information desk  You need to re-schedule your appointment should you arrive 10 or more minutes late.  We strive to give you quality time with our providers, and arriving late affects you and other patients whose appointments are after yours.  Also, if you no show three or more times for appointments you may be dismissed from the clinic at the providers discretion.     Again, thank you for choosing [facility].  Our hope is that these requests will decrease the amount of time that you wait before being seen by our physicians.       _____________________________________________________________  Should you have questions after your visit to [facility], please contact our office at [phone] between the hours of 8:30 a.m. and 4:30 p.m.  Voicemails left after 4:30 p.m. will not be returned until the following business day.  For prescription refill requests, have your pharmacy contact our office.         Resources For Cancer Patients and their Caregivers ? American Cancer  Society: Can assist with transportation, wigs, general needs, runs Look Good Feel Better.        213-188-2059 ? Cancer Care: Provides financial assistance, online support groups, medication/co-pay assistance.  1-800-813-HOPE (574)234-9050) ? [place] Assists [place] cancer patients and their families through emotional , educational and financial support.  312-046-2185 ? [place] DSS Where to apply for food stamps, Medicaid and utility assistance. [phone] ? RCATS: Transportation to medical appointments. 9093620573 ? Social Security Administration: May apply for disability if have a Stage IV cancer. ([phone] [phone] ? [employer], Disability and Transit Services: Assists with nutrition, care and transit needs. [place] Support Programs: @10RELATIVEDAYS @ > Cancer Support Group  2nd Tuesday of the month 1pm-2pm, Journey Room  > Creative Journey  3rd Tuesday of the month 1130am-1pm, Journey Room  > Look Good Feel Better  1st Wednesday of the month 10am-12 noon, Journey Room (Call [place] to register [phone])

## 2016-02-15 NOTE — Progress Notes (Signed)
[name]    HEMATOLOGY/ONCOLOGY CONSULTATION NOTE  Date of Service: 02/12/2016  Patient Care Team: Zella Richer. Scotty Court, MD as PCP - General (Unknown Physician Specialty)  CHIEF COMPLAINTS/PURPOSE OF CONSULTATION:  Continued management of metastatic breast cancer  HISTORY OF PRESENTING ILLNESS:   Debbie Reyes is a wonderful 75 y.o. female who has been referred to [place] by Dr .[name], MD /Dr Tressie Stalker for continued management of metastatic breast cancer.  Patient has been treated with multiple lines of treatment summarized oncologic history as per Dr. Patria Mane note  Breast cancer metastasized to multiple sites (*)  06/07/2005 Biopsy  Initial biopsy left axillary nodal mass, no primary found in the left breast  08/11/2005 - Chemotherapy  Adriamycin and Cytoxan initiated with 20% dose reduction at the fourth cycle, 4 cycles given only  11/21/2005 - 12/30/2005 Radiation  Left breast and regional lymph nodes irradiated by Dr. Eppie Gibson, 5040 cGy  11/28/2005 - 11/22/2010 Hormone Therapy  Arimidex given for 5 years  12/20/2010 Initial Diagnosis  Breast cancer metastasized to multiple sites  01/17/2011 - Radiation  1200 cGy to the left rib and 2050 cGy in 20 fractions to the T5-T10 vertebral bodies with a boost of 675 cGy in 3 fractions  01/25/2011 - 03/24/2011 Targeted Therapy  Faslodex initiated approximately on this date then stopped. Stopped due to failure to respond. Faslodex given in a dose of 250 mg only 3 doses (July 17, July 31, August 14) and a single 500 mg dose March 24, 2011  04/25/2011 - 10/13/2011 Chemotherapy  Taxol chemotherapy given weekly 2 with one week off. Stopped due to progression  10/24/2011 - 05/11/2012 Hormone Therapy  Aromasin and everolimus initiating but stopped due to progression  11/11/2011 Biopsy  Bone biopsy, L1 vertebral body positive breast cancer ER positive, PR positive  12/19/2012 - 06/10/2013 Hormone Therapy  Fareston initiated but stopped after  failure  06/11/2013 - 01/08/2014 Chemotherapy  Abraxane initiated weekly with response initially but then progression  01/22/2014 Progression  Has significant elevation in cancer marker and progression in nodal and bone lesions  02/01/2014 - 05/06/2015 Chemotherapy  Capecitabine initiated at 750 mg meter squared twice a day 7 days on and 7 days off  06/11/2014 Remission  Excellent partial remission established by PET scan criteria with almost complete disappearance of the left upper lobe nodule and complete disappearance of the single liver lesion seen in the past  05/05/2015 Progression  Significant progression in multiple new bones  05/06/2015 - 08/25/2015 Hormone Therapy  Megace initiated but stopped due to progression  08/25/2015 Progression  Progression within the liver confirmed by PET scan on 08/14/2015 and confirmed by MRI of the liver August 25 2015  09/09/2015 - Targeted Therapy  Faslodex/Palbociclib initiated  09/22/2015 Adverse Reaction  Grade 3 neutropenia secondary to Palbociclib 125 mg 14 days, the last 7 days will be held with dose reduction with the next cycle to 100 mg   Patient is here to transfer her cares for ongoing treatment for her metastatic ER/PR positive breast cancer. She has been on Ibrance + Faslodex since 09/2015 with PET/CT on 12/15/2015 apparently showing partial metabolic response with improvement in most of her metastatic disease and 2 stable bone lesions. Also noted to have persistent hypermetabolic patchy opacity in the anterior left upper lobe likely reflecting pulmonary metastasis . 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 She also continues to be on Xgeva for her bone disease.  Her Ibrance dose was reduced to 100mg  daily due to  Neutropenia. No issues with infection.  Her neutrophil counts are better on the reviews dose. She reports no new focal symptoms. Her pain is controlled with Vicodin about 2 times daily. No fevers or  chills.  She is to start her next cycle of Ibrance tomorrow and a prescription has been sent to [facility] for this. Her next dose of Faslodex and Xgeva are on 02/27/2016.   MEDICAL HISTORY:  Past Medical History:  Diagnosis Date  . Bone metastases ([place]) 12/29/2015  . Cancer ([place])   . Metastatic breast cancer ([address] [place] [address]) 12/29/2015  Depression   anxiety   SURGICAL HISTORY: L1 bone metastases biopsy  Axillary lymph node biopsy   SOCIAL HISTORY: Social History   Social History  . Marital status: Widowed    Spouse name: N/A  . Number of children: N/A  . Years of education: N/A   Occupational History  . Not on file.   Social History Main Topics  . Smoking status: Never Smoker  . Smokeless tobacco: Never Used  . Alcohol use No  . Drug use: No  . Sexual activity: Not on file   Other Topics Concern  . Not on file   Social History Narrative  . No narrative on file    FAMILY HISTORY: Patient denies family history of breast cancer, ovarian cancer or uterine cancer . Denies any family history of blood clotting or bleeding disorders   ALLERGIES:  has No Known Allergies.  MEDICATIONS:  Current Outpatient Prescriptions  Medication Sig Dispense Refill  . aspirin EC 81 MG tablet Take 81 mg by mouth daily.    [name] denosumab (XGEVA) 120 MG/1.7ML SOLN Inject 120 mg into the skin once. Pt took on 10/13/11    . HYDROcodone-acetaminophen (NORCO/VICODIN) 5-325 MG tablet Take 1 tablet by mouth every 6 (six) hours as needed. For pain 100 tablet 0  . LORazepam (ATIVAN) 1 MG tablet Take 1 mg by mouth at bedtime as needed. For sleep    . Melatonin 1 MG CAPS Take by mouth at bedtime as needed.    [name] PARoxetine (PAXIL) 20 MG tablet Take 20 mg by mouth daily.    . propranolol (INDERAL) 20 MG tablet Take 20 mg by mouth 2 (two) times daily.    . palbociclib (IBRANCE) 100 MG capsule Take 1 capsule (100 mg total) by mouth daily with breakfast. Take whole with food. 21 capsule 1   No current  facility-administered medications for this visit.     REVIEW OF SYSTEMS:    10 Point review of Systems was done is negative except as noted above.  PHYSICAL EXAMINATION: ECOG PERFORMANCE STATUS: 2 - Symptomatic, <50% confined to bed  . Vitals:   02/12/16 1501  BP: 130/67  Pulse: 77  Resp: 18  Temp: 97.6 F (36.4 C)   Filed Weights   02/12/16 1501  Weight: 107 lb 12.8 oz (48.9 kg)   .Body mass index is 21.05 kg/m.  GENERAL:alert, in no acute distress and comfortable SKIN: skin color, texture, turgor are normal, no rashes or significant lesions EYES: normal, conjunctiva are pink and non-injected, sclera clear OROPHARYNX:no exudate, no erythema and lips, buccal mucosa, and tongue normal  NECK: supple, no JVD, thyroid normal size, non-tender, without nodularity LYMPH:  no palpable lymphadenopathy in the cervical, axillary or inguinal or supraclavicular LN LUNGS: clear to auscultation with normal respiratory effort HEART: regular rate & rhythm,  no murmurs and  trace pedal edema. ABDOMEN: abdomen soft, non-tender, normoactive bowel sounds  Musculoskeletal: no cyanosis of  digits and no clubbing  PSYCH: alert & oriented x 3 with fluent speech NEURO: no focal motor/sensory deficits  LABORATORY DATA:  I have reviewed the data as listed  . CBC Latest Ref Rng & Units 02/09/2016 02/02/2016 01/27/2016  WBC 4.0 - 10.5 K/uL 3.0(L) 2.3(L) 2.7(L)  Hemoglobin 12.0 - 15.0 g/dL 9.3(L) 9.6(L) 9.8(L)  Hematocrit 36.0 - 46.0 % 27.2(L) 28.7(L) 29.1(L)  Platelets 150 - 400 K/uL 137(L) 176 190   . CBC    Component Value Date/Time   WBC 3.0 (L) 02/09/2016 1426   RBC 2.56 (L) 02/09/2016 1426   HGB 9.3 (L) 02/09/2016 1426   HCT 27.2 (L) 02/09/2016 1426   PLT 137 (L) 02/09/2016 1426   MCV 106.3 (H) 02/09/2016 1426   MCH 36.3 (H) 02/09/2016 1426   MCHC 34.2 02/09/2016 1426   RDW 18.3 (H) 02/09/2016 1426   LYMPHSABS 0.7 02/09/2016 1426   MONOABS 0.5 02/09/2016 1426   EOSABS 0.0 02/09/2016  1426   BASOSABS 0.1 02/09/2016 1426     . CMP Latest Ref Rng & Units 01/18/2016 11/11/2011  Glucose 65 - 99 mg/dL 106(H) 92  BUN 6 - 20 mg/dL 16 17  Creatinine 0.44 - 1.00 mg/dL 1.06(H) 0.96  Sodium 135 - 145 mmol/L 134(L) 135  Potassium 3.5 - 5.1 mmol/L 4.2 4.4  Chloride 101 - 111 mmol/L 100(L) 99  CO2 22 - 32 mmol/L 26 25  Calcium 8.9 - 10.3 mg/dL 8.8(L) 9.5  Total Protein 6.5 - 8.1 g/dL 6.9 -  Total Bilirubin 0.3 - 1.2 mg/dL 0.5 -  Alkaline Phos 38 - 126 U/L 52 -  AST 15 - 41 U/L 23 -  ALT 14 - 54 U/L 10(L) -      RADIOGRAPHIC STUDIES: I have personally reviewed the radiological images as listed and agreed with the findings in the report. No results found.  ASSESSMENT & PLAN:   75 year old Caucasian female with   #1 metastatic ER/PR positive breast cancer. Status post extensive treatment as noted above in her oncologic history. Patient is currently on Faslodex/Ibrance since March 2017 . Her CA-27-29 levels are gradually increasing but her PET/CT scan on 12/15/2015 showed partial metabolic response and therefore the same treatment was continued . Patient has no acute focal symptoms at this time. Mild fatigue but is functioning independently . She is feeling well overall . Had some neutropenia with 125 mg of Ibrance and therefore the dose has been reduced to 100mg   #2 neutropenia has resolved with the patient still has some leukopenia likely related to Ibrance  #3 anemia and thrombocytopenia likely related to the treatment and the patient's breast cancer . Plan -patient appropriate to start next cycle of Ibrance from tomorrow . Prescription has been sent to [facility] . -Next dose of Faslodex and Xgeva on 02/17/2016 with port flush. -Patient counseled to report any new focal symptoms or fevers immediately. -We'll repeat tumor markers and follow-up in one month.   Return to care with Dr. Whitney Muse in 1 month's with CBC, CMP, breast cancer tumor markers.  All of the patients  questions were answered to her apparent satisfaction. The patient knows to call the clinic with any problems, questions or concerns.  I spent 60 minutes counseling the patient face to face. The total time spent in the appointment was 70 minutes and more than 50% was on counseling and direct patient cares.    Sullivan Lone MD [place] AAHIVMS [facility] Hematology/Oncology Physician [place]  (Office):  [phone] (Work cell):  [phone] (Fax):           [phone]

## 2016-02-17 ENCOUNTER — Ambulatory Visit (HOSPITAL_COMMUNITY): Payer: Medicare Other | Admitting: Hematology & Oncology

## 2016-02-17 ENCOUNTER — Encounter (HOSPITAL_BASED_OUTPATIENT_CLINIC_OR_DEPARTMENT_OTHER): Payer: Medicare Other

## 2016-02-17 ENCOUNTER — Encounter (HOSPITAL_COMMUNITY): Payer: Medicare Other

## 2016-02-17 VITALS — BP 124/61 | HR 79 | Temp 98.0°F | Resp 18

## 2016-02-17 DIAGNOSIS — Z5111 Encounter for antineoplastic chemotherapy: Secondary | ICD-10-CM

## 2016-02-17 DIAGNOSIS — C7951 Secondary malignant neoplasm of bone: Secondary | ICD-10-CM | POA: Diagnosis not present

## 2016-02-17 DIAGNOSIS — C50919 Malignant neoplasm of unspecified site of unspecified female breast: Secondary | ICD-10-CM

## 2016-02-17 DIAGNOSIS — C799 Secondary malignant neoplasm of unspecified site: Secondary | ICD-10-CM | POA: Diagnosis not present

## 2016-02-17 LAB — CBC WITH DIFFERENTIAL/PLATELET
BASOS PCT: 3 %
Basophils Absolute: 0.1 10*3/uL (ref 0.0–0.1)
EOS ABS: 0 10*3/uL (ref 0.0–0.7)
Eosinophils Relative: 1 %
HCT: 27.9 % — ABNORMAL LOW (ref 36.0–46.0)
Hemoglobin: 9.3 g/dL — ABNORMAL LOW (ref 12.0–15.0)
Lymphocytes Relative: 21 %
Lymphs Abs: 0.8 10*3/uL (ref 0.7–4.0)
MCH: 35.8 pg — ABNORMAL HIGH (ref 26.0–34.0)
MCHC: 33.3 g/dL (ref 30.0–36.0)
MCV: 107.3 fL — ABNORMAL HIGH (ref 78.0–100.0)
MONO ABS: 1.1 10*3/uL — AB (ref 0.1–1.0)
MONOS PCT: 27 %
Neutro Abs: 1.9 10*3/uL (ref 1.7–7.7)
Neutrophils Relative %: 48 %
Platelets: 201 10*3/uL (ref 150–400)
RBC: 2.6 MIL/uL — ABNORMAL LOW (ref 3.87–5.11)
RDW: 18.4 % — AB (ref 11.5–15.5)
WBC: 3.9 10*3/uL — ABNORMAL LOW (ref 4.0–10.5)

## 2016-02-17 LAB — COMPREHENSIVE METABOLIC PANEL
ALBUMIN: 3.6 g/dL (ref 3.5–5.0)
ALT: 9 U/L — ABNORMAL LOW (ref 14–54)
ANION GAP: 7 (ref 5–15)
AST: 24 U/L (ref 15–41)
Alkaline Phosphatase: 57 U/L (ref 38–126)
BILIRUBIN TOTAL: 0.3 mg/dL (ref 0.3–1.2)
BUN: 11 mg/dL (ref 6–20)
CO2: 26 mmol/L (ref 22–32)
Calcium: 8.6 mg/dL — ABNORMAL LOW (ref 8.9–10.3)
Chloride: 101 mmol/L (ref 101–111)
Creatinine, Ser: 0.88 mg/dL (ref 0.44–1.00)
GFR calc non Af Amer: >60 mL/min (ref >60)
GLUCOSE: 100 mg/dL — AB (ref 65–99)
POTASSIUM: 4.3 mmol/L (ref 3.5–5.1)
SODIUM: 134 mmol/L — AB (ref 135–145)
TOTAL PROTEIN: 7 g/dL (ref 6.5–8.1)

## 2016-02-17 LAB — FOLATE: FOLATE: 9.9 ng/mL (ref >5.9)

## 2016-02-17 LAB — VITAMIN B12: VITAMIN B 12: 248 pg/mL (ref 180–914)

## 2016-02-17 MED ORDER — FULVESTRANT 250 MG/5ML IM SOLN
INTRAMUSCULAR | Status: AC
  Filled 2016-02-17: qty 10

## 2016-02-17 MED ORDER — FULVESTRANT 250 MG/5ML IM SOLN
500.0000 mg | INTRAMUSCULAR | Status: DC
  Administered 2016-02-17: 500 mg via INTRAMUSCULAR

## 2016-02-17 NOTE — Progress Notes (Signed)
Debbie Reyes tolerated Faslodex injections well without complaints. CMET results shown to [facility] PA including Calcium 8.6 and albumin 3.6. Delton See held today per Kirby Crigler PA. Pt instructed to make sure she continues to take her Calcium BID, understanding verbalized per pt. Pt discharged self ambulatory in satisfactory condition

## 2016-02-17 NOTE — Patient Instructions (Signed)
[facility] Discharge Instructions for Patients Receiving Chemotherapy   Beginning January 23rd 2017 lab work for the [facility] will be done in the  Main lab at [facility] on 1st floor. If you have a lab appointment with the [facility] please come in thru the  Main Entrance and check in at the main information desk   Today you received the following chemotherapy agents Faslodex. Follow-up as scheduled. Call clinic for any questions or concerns  To help prevent nausea and vomiting after your treatment, we encourage you to take your nausea medication   If you develop nausea and vomiting, or diarrhea that is not controlled by your medication, call the clinic.  The clinic phone number is [phone]. Office hours are Monday-Friday 8:30am-5:00pm.  BELOW ARE SYMPTOMS THAT SHOULD BE REPORTED IMMEDIATELY:  *FEVER GREATER THAN 101.0 F  *CHILLS WITH OR WITHOUT FEVER  NAUSEA AND VOMITING THAT IS NOT CONTROLLED WITH YOUR NAUSEA MEDICATION  *UNUSUAL SHORTNESS OF BREATH  *UNUSUAL BRUISING OR BLEEDING  TENDERNESS IN MOUTH AND THROAT WITH OR WITHOUT PRESENCE OF ULCERS  *URINARY PROBLEMS  *BOWEL PROBLEMS  UNUSUAL RASH Items with * indicate a potential emergency and should be followed up as soon as possible. If you have an emergency after office hours please contact your primary care physician or go to the nearest emergency department.  Please call the clinic during office hours if you have any questions or concerns.   You may also contact the Patient Navigator at [phone] should you have any questions or need assistance in obtaining follow up care.      Resources For Cancer Patients and their Caregivers ? American Cancer Society: Can assist with transportation, wigs, general needs, runs Look Good Feel Better.        670-498-0869 ? Cancer Care: Provides financial assistance, online support groups, medication/co-pay assistance.  1-800-813-HOPE  229-073-9495) ? [place] Assists [place] cancer patients and their families through emotional , educational and financial support.  332-841-0263 ? [place] DSS Where to apply for food stamps, Medicaid and utility assistance. (639)140-8235 ? [facility]: Transportation to medical appointments. (959) 116-1716 ? Social Security Administration: May apply for disability if have a Stage IV cancer. (719) 187-3389 (774)728-9341 ? [employer], Disability and Transit Services: Assists with nutrition, care and transit needs. ([phone])043-7332

## 2016-02-18 ENCOUNTER — Telehealth (HOSPITAL_COMMUNITY): Payer: Self-pay | Admitting: *Deleted

## 2016-02-18 ENCOUNTER — Other Ambulatory Visit (HOSPITAL_COMMUNITY): Payer: Self-pay

## 2016-02-18 LAB — CANCER ANTIGEN 27.29: CA 27.29: 681.1 U/mL — ABNORMAL HIGH (ref 0.0–38.6)

## 2016-02-18 NOTE — Telephone Encounter (Signed)
Lab appts made for the month of August weekly. Pt given these appt times and verbalized understanding

## 2016-02-22 ENCOUNTER — Other Ambulatory Visit (HOSPITAL_COMMUNITY): Payer: Self-pay | Admitting: Oncology

## 2016-02-22 DIAGNOSIS — C50919 Malignant neoplasm of unspecified site of unspecified female breast: Secondary | ICD-10-CM

## 2016-02-24 ENCOUNTER — Encounter (HOSPITAL_COMMUNITY): Payer: Medicare Other

## 2016-02-24 DIAGNOSIS — C799 Secondary malignant neoplasm of unspecified site: Secondary | ICD-10-CM | POA: Diagnosis not present

## 2016-02-24 DIAGNOSIS — C50919 Malignant neoplasm of unspecified site of unspecified female breast: Secondary | ICD-10-CM

## 2016-02-24 LAB — CBC WITH DIFFERENTIAL/PLATELET
BASOS ABS: 0.1 10*3/uL (ref 0.0–0.1)
Basophils Relative: 2 %
EOS ABS: 0.1 10*3/uL (ref 0.0–0.7)
EOS PCT: 2 %
HCT: 29.7 % — ABNORMAL LOW (ref 36.0–46.0)
Hemoglobin: 9.7 g/dL — ABNORMAL LOW (ref 12.0–15.0)
Lymphocytes Relative: 17 %
Lymphs Abs: 0.7 10*3/uL (ref 0.7–4.0)
MCH: 35 pg — AB (ref 26.0–34.0)
MCHC: 32.7 g/dL (ref 30.0–36.0)
MCV: 107.2 fL — ABNORMAL HIGH (ref 78.0–100.0)
MONO ABS: 0.2 10*3/uL (ref 0.1–1.0)
Monocytes Relative: 5 %
Neutro Abs: 3 10*3/uL (ref 1.7–7.7)
Neutrophils Relative %: 74 %
PLATELETS: 266 10*3/uL (ref 150–400)
RBC: 2.77 MIL/uL — AB (ref 3.87–5.11)
RDW: 17.6 % — AB (ref 11.5–15.5)
WBC: 4 10*3/uL (ref 4.0–10.5)

## 2016-03-01 ENCOUNTER — Encounter (HOSPITAL_COMMUNITY)
Admission: RE | Admit: 2016-03-01 | Discharge: 2016-03-01 | Disposition: A | Discharge status: Home / Self Care | Payer: Medicare Other | Source: Ambulatory Visit | Attending: Oncology | Admitting: Oncology

## 2016-03-01 DIAGNOSIS — C799 Secondary malignant neoplasm of unspecified site: Secondary | ICD-10-CM | POA: Insufficient documentation

## 2016-03-01 DIAGNOSIS — C50919 Malignant neoplasm of unspecified site of unspecified female breast: Secondary | ICD-10-CM | POA: Diagnosis not present

## 2016-03-01 LAB — GLUCOSE, CAPILLARY
GLUCOSE-CAPILLARY: 106 mg/dL — AB (ref 65–99)
Glucose-Capillary: 50 mg/dL — ABNORMAL LOW (ref 65–99)

## 2016-03-01 MED ORDER — FLUDEOXYGLUCOSE F - 18 (FDG) INJECTION
5.3000 | Freq: Once | INTRAVENOUS | Status: AC | PRN
  Administered 2016-03-01: 5.3 via INTRAVENOUS

## 2016-03-02 ENCOUNTER — Encounter (HOSPITAL_COMMUNITY): Payer: Medicare Other

## 2016-03-02 DIAGNOSIS — C50919 Malignant neoplasm of unspecified site of unspecified female breast: Secondary | ICD-10-CM | POA: Diagnosis not present

## 2016-03-02 DIAGNOSIS — C799 Secondary malignant neoplasm of unspecified site: Secondary | ICD-10-CM | POA: Diagnosis not present

## 2016-03-02 LAB — CBC WITH DIFFERENTIAL/PLATELET
BASOS PCT: 0 %
Basophils Absolute: 0 10*3/uL (ref 0.0–0.1)
EOS PCT: 3 %
Eosinophils Absolute: 0.1 10*3/uL (ref 0.0–0.7)
HCT: 26 % — ABNORMAL LOW (ref 36.0–46.0)
Hemoglobin: 8.7 g/dL — ABNORMAL LOW (ref 12.0–15.0)
LYMPHS ABS: 0.6 10*3/uL — AB (ref 0.7–4.0)
Lymphocytes Relative: 34 %
MCH: 35.7 pg — AB (ref 26.0–34.0)
MCHC: 33.5 g/dL (ref 30.0–36.0)
MCV: 106.6 fL — AB (ref 78.0–100.0)
MONO ABS: 0.2 10*3/uL (ref 0.1–1.0)
MONOS PCT: 12 %
NEUTROS ABS: 1 10*3/uL — AB (ref 1.7–7.7)
Neutrophils Relative %: 51 %
PLATELETS: 131 10*3/uL — AB (ref 150–400)
RBC: 2.44 MIL/uL — ABNORMAL LOW (ref 3.87–5.11)
RDW: 17.3 % — ABNORMAL HIGH (ref 11.5–15.5)
WBC: 1.9 10*3/uL — ABNORMAL LOW (ref 4.0–10.5)

## 2016-03-03 ENCOUNTER — Other Ambulatory Visit (HOSPITAL_COMMUNITY): Payer: Self-pay

## 2016-03-03 DIAGNOSIS — C50919 Malignant neoplasm of unspecified site of unspecified female breast: Secondary | ICD-10-CM

## 2016-03-03 MED ORDER — LIDOCAINE-PRILOCAINE 2.5-2.5 % EX KIT: PACK | CUTANEOUS | 2 refills | Status: AC | PRN

## 2016-03-03 NOTE — Telephone Encounter (Signed)
Prescription for Emla sent to patients pharmacy per PA. Called patient and notified her. She verbalized understanding.

## 2016-03-04 ENCOUNTER — Other Ambulatory Visit (HOSPITAL_COMMUNITY): Payer: Self-pay | Admitting: Oncology

## 2016-03-04 NOTE — Progress Notes (Signed)
Debbie Reyes Kitchen      HEMATOLOGY/ONCOLOGY PROGRESS NOTE  Date of Service: 03/07/2016  Patient Care Team: Zella Richer. Scotty Court, MD as PCP - General (Unknown Physician Specialty)  CHIEF COMPLAINTS/PURPOSE OF CONSULTATION:  Continued management of metastatic breast cancer    Metastatic breast cancer ([place])   06/07/2005 Initial Biopsy    Biopsy L axillary nodal mass, no breast primary identified      08/11/2005 -  Chemotherapy    AC x 4, with 20% dose reduction on last cycle      11/21/2005 - 12/30/2005 Radiation Therapy    XRT 5040 cGY to L breast and L axillae with Dr. Isidore Moos      11/21/2005 - 12/31/2010 Anti-estrogen oral therapy    Arimidex X 5 years      01/20/2011 PET scan    Widespread hypermetabolic osseous metastatic disease. Index lesions are given above. No evidence of hypermetabolic metastatic disease involving the neck, chest, abdomen or pelvis.       01/25/2011 -  Radiation Therapy    1200 cGy to the left rib and 2050 cGy in 20 fractions to the T5-T10 vertebral bodies with a boost of 675 cGy in 3 fractions           01/25/2011 Treatment Plan Change    Faslodex initiated approximately on this date then stopped. Stopped due to failure to respond. Faslodex given in a dose of 250 mg only 3 doses (July 17, July 31, August 14) and a single 500 mg dose March 24, 2011          04/25/2011 - 10/13/2011 Chemotherapy    Taxol chemotherapy given weekly 2 with one week off. Stopped due to progression          10/22/2011 - 05/11/2012 Anti-estrogen oral therapy    Aromasin and everolimus initiating but stopped due to progression          11/11/2011 Initial Biopsy    L3 vertebral body biopsy ER+PR+      11/11/2011 Procedure    Status post vertebral body augmentation for painful pathologic compression fractures at T11 and at L1 using the balloon kyphoplasty technique. Status post vertebral body augmentation for painful pathologic compression fracture at T8 using the  vertebroplasty technique.      12/09/2012 - 06/10/2013 Anti-estrogen oral therapy    Fareston      04/22/2013 PET scan    Hypermetabolic left upper lobe nodule, possibly metastatic. The possibility of primary bronchogenic carcinoma is also considered. Residual hypermetabolic metastatic disease involving the liver and bones. Small left pleural effusion, stable or minimally increased from 03/07/2013.      06/11/2013 - 01/08/2014 Chemotherapy    Abraxane      01/31/2014 PET scan    Progressive metastatic lymphadenopathy within the right supraclavicular region and mediastinum.New hypermetabolic left adrenal metastasis. Mixed response of hypermetabolic liver metastases. Mild decrease in hypermetabolic activity associated with 1 cm left upper lobe pulmonary nodule. Diffuse bone metastases, with several in the thorax showing increased hypermetabolic activity consistent with mild progression of bone metastases.      02/01/2014 - 05/06/2015 Chemotherapy    XELODA 750 mg/m2 7 on/7 off       06/11/2014 PET scan    Partial metabolic response. Improving mediastinal lymphadenopathy, as above. Improving left adrenal metastasis. Prior hepatic metastasis is no longer evident.Improving multifocal osseous metastases, as above      05/04/2015 PET scan    Dominant finding is new widespread skeletal metastasis involvingthe axillary and appendicular skeleton. New  lesions are not discretely seen on the CT but recognized as new metabolic activity. New LEFT adrenal gland hypermetabolic metastasis. Foci of metabolic activity in the LEFT lung are felt to relate to atelectasis and/or infection. No clear evidence of pulmonary metastasis Moderate pleural LEFT pleural which could be sampled for cytology if clinically relevant.LEFT adrenal metastasis is actually an enlarged LEFT periaortic lymph node. This lymph node was enlarged and hypermetabolic to a greater degree FDG PET 01/31/2014 and subsequently improved and now has  recurred on current exam (05/04/2015) as a hypermetabolic enlarged LEFT periaortic lymph node.      05/04/2015 Progression    PET with progression of disease      05/06/2015 Treatment Plan Change    Megace      08/14/2015 PET scan    Suspect new hypermetabolic metastasis in posterior right hepatic lobe. Consider abdomen MRI without and with contrast for further evaluation. No significant change in probable malignant left pleural effusion and left lung atelectasis.No significant change in diffuse osseous metastatic disease. Stable hypermetabolic left abdominal paraaortic lymph node, consistent with metastatic disease      09/09/2015 - 03/07/2016 Chemotherapy    IBRANCE/FASLODEX . Ibrance at 100 mg secondary to grade 3 neutropenia      03/03/2016 PET scan    Interval overall progression of disease. Interval development of new and progression of pre-existing lesions in the liver, hypermetabolic on PET imaging, consistent with metastatic disease. Persistent hypermetabolic patchy opacity in the anterior left upper lobe with persistent chronic left pleural effusion. Diffuse bony metastases again noted with interval progression ofFDG uptake associated with the index bone lesions.      03/03/2016 Progression    PET demonstrates progression of disease.        HISTORY OF PRESENTING ILLNESS:   Debbie Reyes is a wonderful 75 y.o. female who has been referred to [place] by Dr .[name], MD /Dr Tressie Stalker for continued management of metastatic breast cancer.  She has most recently been maintained on ibrance/faslodex with unfortunate rising tumor markers and now PET imaging showing progression of disease. She is still is good spirits and with good PS.   Disease as noted is heavily pre-treated. She appears to have taxane resistance based upon progression through taxol.   Appetite is marginal, she still does all of her ADL's. She denies any new or uncontrolled pain.   MEDICAL HISTORY:  Past  Medical History:  Diagnosis Date  . Bone metastases ([place]) 12/29/2015  . Cancer ([place])   . Metastatic breast cancer ([place]) 12/29/2015  Depression   anxiety   SURGICAL HISTORY: L1 bone metastases biopsy  Axillary lymph node biopsy   SOCIAL HISTORY: Social History   Social History  . Marital status: Widowed    Spouse name: N/A  . Number of children: N/A  . Years of education: N/A   Occupational History  . Not on file.   Social History Main Topics  . Smoking status: Never Smoker  . Smokeless tobacco: Never Used  . Alcohol use No  . Drug use: No  . Sexual activity: No   Other Topics Concern  . Not on file   Social History Narrative  . No narrative on file    FAMILY HISTORY: Patient denies family history of breast cancer, ovarian cancer or uterine cancer . Denies any family history of blood clotting or bleeding disorders   ALLERGIES:  has No Known Allergies.  MEDICATIONS:  Current Outpatient Prescriptions  Medication Sig Dispense Refill  . aspirin  EC 81 MG tablet Take 81 mg by mouth daily.    Debbie Reyes Kitchen denosumab (XGEVA) 120 MG/1.7ML SOLN Inject 120 mg into the skin once. Pt took on 10/13/11    . HYDROcodone-acetaminophen (NORCO/VICODIN) 5-325 MG tablet Take 1 tablet by mouth every 6 (six) hours as needed. For pain 100 tablet 0  . lidocaine-prilocaine (EMLA) cream Apply topically as needed. 1 each 2  . LORazepam (ATIVAN) 1 MG tablet Take 1 mg by mouth at bedtime as needed. For sleep    . Melatonin 1 MG CAPS Take by mouth at bedtime as needed.    Debbie Reyes Kitchen PARoxetine (PAXIL) 20 MG tablet Take 20 mg by mouth daily.    . propranolol (INDERAL) 20 MG tablet Take 20 mg by mouth 2 (two) times daily.    . EriBULin Mesylate (HALAVEN IV) Inject into the vein. Day 1 day 8, every 21 days    . megestrol (MEGACE) 400 MG/10ML suspension Take 10 mLs (400 mg total) by mouth daily. 240 mL 1  . ondansetron (ZOFRAN) 8 MG tablet Take 1 tablet (8 mg total) by mouth 2 (two) times daily as needed (Nausea or  vomiting). 30 tablet 1  . prochlorperazine (COMPAZINE) 10 MG tablet Take 1 tablet (10 mg total) by mouth every 6 (six) hours as needed (Nausea or vomiting). 30 tablet 1   No current facility-administered medications for this visit.     REVIEW OF SYSTEMS:   14 point review of systems was performed and is negative except as detailed under history of present illness and above   PHYSICAL EXAMINATION: ECOG PERFORMANCE STATUS: 1 - Symptomatic but completely ambulatory  . Vitals:   03/07/16 0903  BP: 133/67  Pulse: 95  Resp: 16  Temp: 97.6 F (36.4 C)   Filed Weights   03/07/16 0903  Weight: 106 lb 6.4 oz (48.3 kg)   .Body mass index is 20.78 kg/m.  GENERAL:alert, in no acute distress and comfortable SKIN: skin color, texture, turgor are normal, no rashes or significant lesions EYES: normal, conjunctiva are pink and non-injected, sclera clear OROPHARYNX:no exudate, no erythema and lips, buccal mucosa, and tongue normal  NECK: supple, no JVD, thyroid normal size, non-tender, without nodularity LYMPH:  no palpable lymphadenopathy in the cervical, axillary or inguinal or supraclavicular LN LUNGS: clear to auscultation with normal respiratory effort HEART: regular rate & rhythm,  no murmurs and  trace pedal edema. ABDOMEN: abdomen soft, non-tender, normoactive bowel sounds  Musculoskeletal: no cyanosis of digits and no clubbing  PSYCH: alert & oriented x 3 with fluent speech NEURO: no focal motor/sensory deficits  LABORATORY DATA:  I have reviewed the data as listed Results for MATAYA, HAUGE (MRN [id number])   Ref. Range 03/02/2016 13:20  WBC Latest Ref Range: 4.0 - 10.5 K/uL 1.9 (L)  RBC Latest Ref Range: 3.87 - 5.11 MIL/uL 2.44 (L)  Hemoglobin Latest Ref Range: 12.0 - 15.0 g/dL 8.7 (L)  HCT Latest Ref Range: 36.0 - 46.0 % 26.0 (L)  MCV Latest Ref Range: 78.0 - 100.0 fL 106.6 (H)  MCH Latest Ref Range: 26.0 - 34.0 pg 35.7 (H)  MCHC Latest Ref Range: 30.0 - 36.0 g/dL 33.5    RDW Latest Ref Range: 11.5 - 15.5 % 17.3 (H)  Platelets Latest Ref Range: 150 - 400 K/uL 131 (L)  Neutrophils Latest Units: % 51  Lymphocytes Latest Units: % 34  Monocytes Relative Latest Units: % 12  Eosinophil Latest Units: % 3  Basophil Latest Units: % 0  NEUT# Latest  Ref Range: 1.7 - 7.7 K/uL 1.0 (L)  Lymphocyte # Latest Ref Range: 0.7 - 4.0 K/uL 0.6 (L)  Monocyte # Latest Ref Range: 0.1 - 1.0 K/uL 0.2  Eosinophils Absolute Latest Ref Range: 0.0 - 0.7 K/uL 0.1  Basophils Absolute Latest Ref Range: 0.0 - 0.1 K/uL 0.0     Results for QUAYLA, LUBITZ (MRN [id number])  Ref. Range 02/17/2016 13:07  Folate Latest Ref Range: >5.9 ng/mL 9.9  Vitamin B12 Latest Ref Range: 180 - 914 pg/mL 248   Results for VERBIE, BOUTROS (MRN [id number])  Ref. Range 01/18/2016 11:45 02/17/2016 13:06  CA 27.29 Latest Ref Range: 0.0 - 38.6 U/mL 392.4 (H) 681.1 (H)   RADIOGRAPHIC STUDIES: I have personally reviewed the radiological images as listed and agreed with the findings in the report. Nm Pet Image Restag (ps) Skull Base To Thigh  Result Date: 03/01/2016 CLINICAL DATA:  Subsequent treatment strategy for breast cancer. EXAM: NUCLEAR MEDICINE PET SKULL BASE TO THIGH TECHNIQUE: 5.3 mCi F-18 FDG was injected intravenously. Full-ring PET imaging was performed from the skull base to thigh after the radiotracer. CT data was obtained and used for attenuation correction and anatomic localization. FASTING BLOOD GLUCOSE:  Value: 106 mg/dl COMPARISON:  12/15/2015. FINDINGS: NECK Increased uptake in the left sternocleidomastoid muscle, likely related to patient motion. CHEST The nodular density seen previously in the anterior left upper lobe (image 35 series 8 today) measures 6 mm today with SUV max = 6.3 compared to 6.0 previously. The bandlike linear atelectasis scarring in the anterior left upper lobe is stable without substantial hypermetabolism. Chronic left pleural effusion with pleural thickening again noted. There is  activity in the posterior aspect of the collapse/ consolidation at the left lung base, decreased slightly in the interval. Small right pleural effusion is stable. Right Port-A-Cath tip projects at the SVC/RA junction. ABDOMEN/PELVIS Interval development of increasing and new hypermetabolic metastases in the liver. 17 mm hypermetabolic lesion in the tip of the lateral segment left liver was 5 mm previously. SUV max = 8.0 on today's exam. 6 mm hypermetabolic lesions seen in the medial right liver on the previous study has progressed 17 mm today (image 91 series 4) with SUV max = 6.0. 13 mm hypodense lesion seen previously in the inferior right hepatic lobe is stable. As before, this lesion is not substantially hypermetabolic. There is abdominal aortic atherosclerosis without aneurysm. SKELETON Multiple hypermetabolic bony metastases are evident. Sternal lesion measured previously has SUV max = 8.1 today compared to 6.4 previously. Mid thoracic vertebral body lesion with SUV max = 7.4 today compared to 5.3 previously. Index lesion in the right iliac crest is 14.5 today compared to 10.3 previously. IMPRESSION: 1. Interval overall progression of disease. 2. Interval development of new and progression of pre-existing lesions in the liver, hypermetabolic on PET imaging, consistent with metastatic disease. 3. Persistent hypermetabolic patchy opacity in the anterior left upper lobe with persistent chronic left pleural effusion. 4. Diffuse bony metastases again noted with interval progression of FDG uptake associated with the index bone lesions. Electronically Signed   By: Misty Stanley M.D.   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 Halaven  I believe would be well tolerated and certainly a good option given  her prior therapies. We discussed this in detail today.   She feels her pain is well controlled.   Prolia needs to be re-instituted.   She is to continue on calcium plus D.   We will arrange for drug teaching, she does not need a formal chemotherapy class. Her counts are currently low, this may be just coming off the ibrance of a consequence of all prior therapy. She may need neupogen support. We will certainly monitor throughout. I will plan on seeing her back one week post with labs.  All of the patients questions were answered to her apparent satisfaction. The patient knows to call the clinic with any problems, questions or concerns.  This document serves as a record of services personally performed by Ancil Linsey, MD. It was created on her behalf by Arlyce Harman, a trained medical scribe. The creation of this record is based on the scribe's personal observations and the provider's statements to them. This document has been checked and approved by the attending provider.  I have reviewed the above documentation for accuracy and completeness and I agree with the above.  Kelby Fam. Whitney Muse, MD

## 2016-03-07 ENCOUNTER — Encounter (HOSPITAL_COMMUNITY): Payer: Self-pay | Admitting: Hematology & Oncology

## 2016-03-07 ENCOUNTER — Encounter (HOSPITAL_BASED_OUTPATIENT_CLINIC_OR_DEPARTMENT_OTHER): Payer: Medicare Other | Admitting: Hematology & Oncology

## 2016-03-07 VITALS — BP 133/67 | HR 95 | Temp 97.6°F | Resp 16 | Wt 106.4 lb

## 2016-03-07 DIAGNOSIS — C7972 Secondary malignant neoplasm of left adrenal gland: Secondary | ICD-10-CM | POA: Diagnosis not present

## 2016-03-07 DIAGNOSIS — C773 Secondary and unspecified malignant neoplasm of axilla and upper limb lymph nodes: Secondary | ICD-10-CM

## 2016-03-07 DIAGNOSIS — Z170 Estrogen receptor positive status [ER+]: Secondary | ICD-10-CM

## 2016-03-07 DIAGNOSIS — C50919 Malignant neoplasm of unspecified site of unspecified female breast: Secondary | ICD-10-CM | POA: Diagnosis not present

## 2016-03-07 DIAGNOSIS — C7951 Secondary malignant neoplasm of bone: Secondary | ICD-10-CM | POA: Diagnosis not present

## 2016-03-07 DIAGNOSIS — C787 Secondary malignant neoplasm of liver and intrahepatic bile duct: Secondary | ICD-10-CM | POA: Diagnosis not present

## 2016-03-07 DIAGNOSIS — Z95828 Presence of other vascular implants and grafts: Secondary | ICD-10-CM

## 2016-03-07 DIAGNOSIS — D6481 Anemia due to antineoplastic chemotherapy: Secondary | ICD-10-CM | POA: Diagnosis not present

## 2016-03-07 DIAGNOSIS — D701 Agranulocytosis secondary to cancer chemotherapy: Secondary | ICD-10-CM | POA: Diagnosis not present

## 2016-03-07 DIAGNOSIS — T451X5A Adverse effect of antineoplastic and immunosuppressive drugs, initial encounter: Secondary | ICD-10-CM

## 2016-03-07 MED ORDER — HYDROCODONE-ACETAMINOPHEN 5-325 MG PO TABS: 1.0000 | ORAL_TABLET | Freq: Four times a day (QID) | ORAL | 0 refills | Status: DC | PRN

## 2016-03-07 MED ORDER — MEGESTROL ACETATE 400 MG/10ML PO SUSP: 400.0000 mg | Freq: Every day | ORAL | 1 refills | Status: DC

## 2016-03-07 NOTE — Patient Instructions (Addendum)
[place] at [facility] Discharge Instructions  RECOMMENDATIONS MADE BY THE CONSULTANT AND ANY TEST RESULTS WILL BE SENT TO YOUR REFERRING PHYSICIAN.  You saw Dr. Whitney Muse today. Megace will be sent to your pharmacy. Dr. Whitney Muse will talk Dr.Karb about starting Halaven. Follow up with Dr. Whitney Muse next week.  Thank you for choosing [place] at [facility] to provide your oncology and hematology care.  To afford each patient quality time with our provider, please arrive at least 15 minutes before your scheduled appointment time.   Beginning January 23rd 2017 lab work for the [facility] will be done in the  Main lab at [facility] on 1st floor. If you have a lab appointment with the [place] please come in thru the  Main Entrance and check in at the main information desk  You need to re-schedule your appointment should you arrive 10 or more minutes late.  We strive to give you quality time with our providers, and arriving late affects you and other patients whose appointments are after yours.  Also, if you no show three or more times for appointments you may be dismissed from the clinic at the providers discretion.     Again, thank you for choosing [facility].  Our hope is that these requests will decrease the amount of time that you wait before being seen by our physicians.       _____________________________________________________________  Should you have questions after your visit to [facility], please contact our office at [phone] between the hours of 8:30 a.m. and 4:30 p.m.  Voicemails left after 4:30 p.m. will not be returned until the following business day.  For prescription refill requests, have your pharmacy contact our office.         Resources For Cancer Patients and their Caregivers ? American Cancer Society: Can assist with transportation, wigs, general needs, runs Look Good Feel  Better.        313 877 5680 ? Cancer Care: Provides financial assistance, online support groups, medication/co-pay assistance.  1-800-813-HOPE 504 248 7375) ? [place] Assists [place] cancer patients and their families through emotional , educational and financial support.  906-268-7251 ? [place] DSS Where to apply for food stamps, Medicaid and utility assistance. 240-880-7443 ? [facility]: Transportation to medical appointments. 859-392-9719 ? Social Security Administration: May apply for disability if have a Stage IV cancer. 8052333556 628-715-8907 ? [employer], Disability and Transit Services: Assists with nutrition, care and transit needs. [place] Support Programs: @10RELATIVEDAYS @ > Cancer Support Group  2nd Tuesday of the month 1pm-2pm, Journey Room  > Creative Journey  3rd Tuesday of the month 1130am-1pm, Journey Room  > Look Good Feel Better  1st Wednesday of the month 10am-12 noon, Journey Room (Call [place] to register [phone])

## 2016-03-08 ENCOUNTER — Ambulatory Visit (HOSPITAL_COMMUNITY): Payer: Medicare Other | Admitting: Hematology & Oncology

## 2016-03-08 ENCOUNTER — Other Ambulatory Visit (HOSPITAL_COMMUNITY)
Admission: RE | Admit: 2016-03-08 | Discharge: 2016-03-08 | Disposition: A | Discharge status: Home / Self Care | Payer: Medicare Other | Source: Ambulatory Visit | Attending: Hematology & Oncology | Admitting: Hematology & Oncology

## 2016-03-08 ENCOUNTER — Other Ambulatory Visit (HOSPITAL_COMMUNITY): Payer: Self-pay | Admitting: Emergency Medicine

## 2016-03-08 DIAGNOSIS — C7951 Secondary malignant neoplasm of bone: Secondary | ICD-10-CM | POA: Insufficient documentation

## 2016-03-08 DIAGNOSIS — C50919 Malignant neoplasm of unspecified site of unspecified female breast: Secondary | ICD-10-CM

## 2016-03-08 MED ORDER — ONDANSETRON HCL 8 MG PO TABS: 8.0000 mg | ORAL_TABLET | Freq: Two times a day (BID) | ORAL | 1 refills | Status: AC | PRN

## 2016-03-08 MED ORDER — PROCHLORPERAZINE MALEATE 10 MG PO TABS: 10.0000 mg | ORAL_TABLET | Freq: Four times a day (QID) | ORAL | 1 refills | Status: AC | PRN

## 2016-03-08 NOTE — Patient Instructions (Signed)
[place]    CHEMOTHERAPY INSTRUCTIONS  Premeds: compazine: help prevent nausea  Halaven (eribulin mesylate) injection is a prescription medicine used to treat adults with breast cancer that has spread to other parts of the body, and who have already received other types of anticancer medicines after the cancer has spread.   POTENTIAL SIDE EFFECTS OF TREATMENT: The most common side effects of Halaven in adults with breast cancer include low white blood cell count (neutropenia), low red blood cell count (anemia), weakness and tiredness, hair loss (alopecia), nausea, and constipation.    SELF IMAGE NEEDS AND REFERRALS MADE: Information on look good feel better    EDUCATIONAL MATERIALS GIVEN AND REVIEWED: Chemotherapy and you books, information on Halaven    SELF CARE ACTIVITIES WHILE ON CHEMOTHERAPY:  Increase your fluid intake 48 hours prior to treatment and drink at least 2 quarts (64 oz of water/decaff beverages) per day after treatment. No alcohol intake. No aspirin or other medications unless approved by your oncologist. Eat foods that are light and easy to digest. Eat foods at cold or room temperature (as long as you aren't on the drug Oxaliplatin). No fried, fatty, or spicy foods immediately before or after treatment. Have teeth cleaned professionally before starting treatment. Keep dentures and partial plates clean. Use soft toothbrush and do not use mouthwashes that contain alcohol. Biotene is a good mouthwash that is available at most pharmacies or may be ordered by calling [phone]. Use warm salt water gargles (1 teaspoon salt per 1 quart warm water) before and after meals and at bedtime. Or you may rinse with 2 tablespoons of three-percent hydrogen peroxide mixed in eight ounces of water. Always use sunscreen that has not expired and with SPF (Sun Protection Factor) of 50 or higher. Wear hats to protect your head from the sun. Remember to use sunscreen on  your hands, ears, face, & feet. Use your nausea medication as directed to prevent nausea. Use your stool softener or laxative as directed to prevent constipation. Use your anti-diarrheal medication as directed to stop diarrhea.   Please wash your hands for at least 30 seconds using warm soapy water. Handwashing is the #1 way to prevent the spread of germs. Stay away from sick people or people who are getting over a cold. If you develop respiratory systems such as green/yellow mucus production or productive cough or persistent cough let us know and we will see if you need an antibiotic. It is a good idea to keep a pair of gloves on when going into grocery stores/[employer] to decrease your risk of coming into contact with germs on the carts, etc. Carry alcohol hand gel with you at all times and use it frequently if out in public. All foods need to be cooked thoroughly. No raw foods. No medium or undercooked meats, eggs. If your food is cooked medium well, it does not need to be hot pink or saturated with bloody liquid at all. Vegetables and fruits need to be washed/rinsed under the faucet with a dish detergent before being consumed. You can eat raw fruits and vegetables unless we tell you otherwise but it would be best if you cooked them or bought frozen. Do not eat off of salad bars or hot bars unless you really trust the cleanliness of the restaurant. If you need dental work, please let Dr. Whitney Muse know before you go for your appointment so that we can coordinate the best possible time for you in regards to your  chemo regimen. You need to also let your dentist know that you are actively taking chemo. We may need to do labs prior to your dental appointment. We also want your bowels moving at least every other day. If this is not happening, we need to know so that we can get you on a bowel regimen to help you go. If you are going to have sex, a condom must be used to protect the person that isn't taking chemotherapy.  Chemo can decrease your libido (sex drive).    MEDICATIONS:   Zofran/Ondansetron 8mg  tablet. Take 1 tablet every 8 hours as needed for nausea/vomiting. (#1 nausea med to take, this can constipate)  Compazine/Prochlorperazine 10mg  tablet. Take 1 tablet every 6 hours as needed for nausea/vomiting. (#2 nausea med to take, this can make you sleepy)   EMLA cream. Apply a quarter size amount to port site 1 hour prior to chemo. Do not rub in. Cover with plastic wrap.   Over-the-Counter Meds:  Miralax 1 capful in 8 oz of fluid daily. May increase to two times a day if needed. This is a stool softener. If this doesn't work proceed you can add:  Senokot S  - start with 1 tablet two times a day and increase to 4 tablets two times a day if needed. (total of 8 tablets in a 24 hour period). This is a stimulant laxative.   Call us if this does not help your bowels move.   Imodium 2mg  capsule. Take 2 capsules after the 1st loose stool and then 1 capsule every 2 hours until you go a total of 12 hours without having a loose stool. Call the [facility] if loose stools continue. If diarrhea occurs @ bedtime, take 2 capsules @ bedtime. Then take 2 capsules every 4 hours until morning. Call [facility].      (Please refer to/review other teaching materials that have been provided to you in this blue folder - What to Know During Chemo, What to know After Chemo, Dr. Donald Pore Advice, Constipation Sheet, Diarrhea Sheet, Nausea Sheet, Self Care Activities While on Chemo)    SYMPTOMS TO REPORT AS SOON AS POSSIBLE AFTER TREATMENT:  FEVER GREATER THAN 100.5 F  CHILLS WITH OR WITHOUT FEVER  NAUSEA AND VOMITING THAT IS NOT CONTROLLED WITH YOUR NAUSEA MEDICATION  UNUSUAL SHORTNESS OF BREATH  UNUSUAL BRUISING OR BLEEDING  TENDERNESS IN MOUTH AND THROAT WITH OR WITHOUT PRESENCE OF ULCERS  URINARY PROBLEMS  BOWEL PROBLEMS  UNUSUAL RASH    Wear comfortable clothing and clothing appropriate for  easy access to any Portacath or PICC line. Let us know if there is anything that we can do to make your therapy better!      I have been informed and understand all of the instructions given to me and have received a copy. I have been instructed to call the clinic [phone])  or my family physician as soon as possible for continued medical care, if indicated. I do not have any more questions at this time but understand that I may call the [facility] at [phone]) during office hours should I have questions or need assistance in obtaining follow-up care.        Eribulin solution for injection What is this medicine? ERIBULIN (er e bu lin) is a chemotherapy drug. It is used to treat breast cancer and liposarcoma. This medicine may be used for other purposes; ask your health care provider or pharmacist if you have questions. What should I  tell my health care provider before I take this medicine? They need to know if you have any of these conditions: -heart disease -history of irregular heartbeat -kidney disease -liver disease -low blood counts, like low white cell, platelet, or red cell counts -low levels of potassium or magnesium in the blood -an unusual or allergic reaction to eribulin, other medicines, foods, dyes, or preservatives -pregnant or trying to get pregnant -breast-feeding How should I use this medicine? This medicine is for infusion into a vein. It is given by a health care professional in a hospital or clinic setting. Talk to your pediatrician regarding the use of this medicine in children. Special care may be needed. Overdosage: If you think you have taken too much of this medicine contact a poison control center or emergency room at once. NOTE: This medicine is only for you. Do not share this medicine with others. What if I miss a dose? It is important not to miss your dose. Call your doctor or health care professional if you are unable to keep an appointment. What may  interact with this medicine? Do not take this medicine with any of the following medications: -amiodarone -astemizole -arsenic trioxide -bepridil -bretylium -chloroquine -chlorpromazine -cisapride -clarithromycin -dextromethorphan, quinidine -disopyramide -dofetilide -droperidol -dronedarone -erythromycin -grepafloxacin -halofantrine -haloperidol -ibutilide -levomethadyl -mesoridazine -methadone -pentamidine -procainamide -quinidine -pimozide -posaconazole -probucol -propafenone -saquinavir -sotalol -sparfloxacin -terfenadine -thioridazine -troleandomycin -ziprasidone This list may not describe all possible interactions. Give your health care provider a list of all the medicines, herbs, non-prescription drugs, or dietary supplements you use. Also tell them if you smoke, drink alcohol, or use illegal drugs. Some items may interact with your medicine. What should I watch for while using this medicine? This drug may make you feel generally unwell. This is not uncommon, as chemotherapy can affect healthy cells as well as cancer cells. Report any side effects. Continue your course of treatment even though you feel ill unless your doctor tells you to stop. Call your doctor or health care professional for advice if you get a fever, chills or sore throat, or other symptoms of a cold or flu. Do not treat yourself. This drug decreases your body's ability to fight infections. Try to avoid being around people who are sick. This medicine may increase your risk to bruise or bleed. Call your doctor or health care professional if you notice any unusual bleeding. You may need blood work done while you are taking this medicine. Do not become pregnant while taking this medicine or for 2 weeks after stopping it. Women should inform their doctor if they wish to become pregnant or think they might be pregnant. Men should not father a child while taking this medicine and for 3.5 months after  stopping it. There is a potential for serious side effects to an unborn child. Talk to your health care professional or pharmacist for more information. Do not breast-feed an infant while taking this medicine or for 2 weeks after stopping it. What side effects may I notice from receiving this medicine? Side effects that you should report to your doctor or health care professional as soon as possible: -allergic reactions like skin rash, itching or hives, swelling of the face, lips, or tongue -low blood counts - this medicine may decrease the number of white blood cells, red blood cells and platelets. You may be at increased risk for infections and bleeding. -signs of infection - fever or chills, cough, sore throat, pain or difficulty passing urine -signs of decreased platelets or  bleeding - bruising, pinpoint red spots on the skin, black, tarry stools, blood in the urine -signs of decreased red blood cells - unusually weak or tired, fainting spells, lightheadedness -pain, tingling, numbness in the hands or feet Side effects that usually do not require medical attention (Report these to your doctor or health care professional if they continue or are bothersome.): -constipation -hair loss -headache -loss of appetite -muscle or joint pain -nausea, vomiting -stomach pain This list may not describe all possible side effects. Call your doctor for medical advice about side effects. You may report side effects to FDA at 1-800-FDA-1088. Where should I keep my medicine? This drug is given in a hospital or clinic and will not be stored at home. NOTE: This sheet is a summary. It may not cover all possible information. If you have questions about this medicine, talk to your doctor, pharmacist, or health care provider.    2016, [employer]. (2014-08-13 17:51:40)

## 2016-03-09 ENCOUNTER — Other Ambulatory Visit (HOSPITAL_COMMUNITY): Payer: Medicare Other

## 2016-03-10 ENCOUNTER — Other Ambulatory Visit (HOSPITAL_COMMUNITY): Payer: Self-pay | Admitting: Hematology & Oncology

## 2016-03-10 ENCOUNTER — Inpatient Hospital Stay (HOSPITAL_COMMUNITY): Payer: Medicare Other

## 2016-03-11 ENCOUNTER — Encounter (HOSPITAL_COMMUNITY): Payer: Self-pay

## 2016-03-11 ENCOUNTER — Encounter (HOSPITAL_COMMUNITY): Payer: Medicare Other | Attending: Hematology & Oncology

## 2016-03-11 ENCOUNTER — Encounter (HOSPITAL_COMMUNITY): Payer: Medicare Other

## 2016-03-11 VITALS — BP 119/50 | HR 76 | Temp 97.9°F | Resp 16 | Wt 106.0 lb

## 2016-03-11 DIAGNOSIS — C7951 Secondary malignant neoplasm of bone: Secondary | ICD-10-CM

## 2016-03-11 DIAGNOSIS — C799 Secondary malignant neoplasm of unspecified site: Secondary | ICD-10-CM | POA: Insufficient documentation

## 2016-03-11 DIAGNOSIS — C50612 Malignant neoplasm of axillary tail of left female breast: Secondary | ICD-10-CM

## 2016-03-11 DIAGNOSIS — Z5111 Encounter for antineoplastic chemotherapy: Secondary | ICD-10-CM

## 2016-03-11 DIAGNOSIS — C50919 Malignant neoplasm of unspecified site of unspecified female breast: Secondary | ICD-10-CM | POA: Insufficient documentation

## 2016-03-11 LAB — CBC WITH DIFFERENTIAL/PLATELET
BASOS ABS: 0.1 10*3/uL (ref 0.0–0.1)
Basophils Relative: 3 %
EOS ABS: 0 10*3/uL (ref 0.0–0.7)
EOS PCT: 1 %
HCT: 24.5 % — ABNORMAL LOW (ref 36.0–46.0)
Hemoglobin: 8.3 g/dL — ABNORMAL LOW (ref 12.0–15.0)
LYMPHS PCT: 23 %
Lymphs Abs: 0.6 10*3/uL — ABNORMAL LOW (ref 0.7–4.0)
MCH: 36.2 pg — AB (ref 26.0–34.0)
MCHC: 33.9 g/dL (ref 30.0–36.0)
MCV: 107 fL — AB (ref 78.0–100.0)
MONO ABS: 0.4 10*3/uL (ref 0.1–1.0)
Monocytes Relative: 17 %
Neutro Abs: 1.3 10*3/uL — ABNORMAL LOW (ref 1.7–7.7)
Neutrophils Relative %: 56 %
Platelets: 113 10*3/uL — ABNORMAL LOW (ref 150–400)
RBC: 2.29 MIL/uL — AB (ref 3.87–5.11)
RDW: 17.8 % — AB (ref 11.5–15.5)
WBC: 2.4 10*3/uL — AB (ref 4.0–10.5)

## 2016-03-11 LAB — COMPREHENSIVE METABOLIC PANEL
ALBUMIN: 3.7 g/dL (ref 3.5–5.0)
ALK PHOS: 52 U/L (ref 38–126)
ALT: 10 U/L — ABNORMAL LOW (ref 14–54)
ANION GAP: 8 (ref 5–15)
AST: 22 U/L (ref 15–41)
BILIRUBIN TOTAL: 0.5 mg/dL (ref 0.3–1.2)
BUN: 13 mg/dL (ref 6–20)
CALCIUM: 8.9 mg/dL (ref 8.9–10.3)
CO2: 27 mmol/L (ref 22–32)
CREATININE: 1.03 mg/dL — AB (ref 0.44–1.00)
Chloride: 99 mmol/L — ABNORMAL LOW (ref 101–111)
GFR calc Af Amer: >60 mL/min (ref >60)
GFR calc non Af Amer: 52 mL/min — ABNORMAL LOW (ref >60)
GLUCOSE: 103 mg/dL — AB (ref 65–99)
Potassium: 4.4 mmol/L (ref 3.5–5.1)
Sodium: 134 mmol/L — ABNORMAL LOW (ref 135–145)
TOTAL PROTEIN: 6.9 g/dL (ref 6.5–8.1)

## 2016-03-11 MED ORDER — PROCHLORPERAZINE MALEATE 10 MG PO TABS
10.0000 mg | ORAL_TABLET | Freq: Once | ORAL | Status: AC
  Administered 2016-03-11: 10 mg via ORAL
  Filled 2016-03-11: qty 1

## 2016-03-11 MED ORDER — ONDANSETRON HCL 4 MG PO TABS
8.0000 mg | ORAL_TABLET | Freq: Once | ORAL | Status: AC
  Administered 2016-03-11: 8 mg via ORAL

## 2016-03-11 MED ORDER — SODIUM CHLORIDE 0.9 % IV SOLN
Freq: Once | INTRAVENOUS | Status: AC
  Administered 2016-03-11: 15:00:00 via INTRAVENOUS

## 2016-03-11 MED ORDER — DENOSUMAB 120 MG/1.7ML ~~LOC~~ SOLN
120.0000 mg | Freq: Once | SUBCUTANEOUS | Status: AC
  Administered 2016-03-11: 120 mg via SUBCUTANEOUS
  Filled 2016-03-11: qty 1.7

## 2016-03-11 MED ORDER — ONDANSETRON HCL 4 MG PO TABS
ORAL_TABLET | ORAL | Status: AC
  Filled 2016-03-11: qty 2

## 2016-03-11 MED ORDER — HEPARIN SOD (PORK) LOCK FLUSH 100 UNIT/ML IV SOLN
500.0000 [IU] | Freq: Once | INTRAVENOUS | Status: AC | PRN
  Administered 2016-03-11: 500 [IU]
  Filled 2016-03-11: qty 5

## 2016-03-11 MED ORDER — ERIBULIN MESYLATE CHEMO INJECTION 1 MG/2ML
1.4000 mg/m2 | Freq: Once | INTRAVENOUS | Status: AC
  Administered 2016-03-11: 2 mg via INTRAVENOUS
  Filled 2016-03-11: qty 4

## 2016-03-11 MED ORDER — SODIUM CHLORIDE 0.9% FLUSH
10.0000 mL | INTRAVENOUS | Status: DC | PRN
  Administered 2016-03-11: 10 mL
  Filled 2016-03-11: qty 10

## 2016-03-11 NOTE — Progress Notes (Signed)
1500- Labs reviewed by MD, ok to proceed with therapy per MD.   Debbie Reyes presents today for injection per MD orders. Xgeva 120mg  administered SQ in right Upper Arm. Administration without incident. Patient tolerated well.  Script given for a wig.    Chemotherapy given today per orders. Patient tolerated well without incidence.Vitals stable, Discharged ambulatory from clinic.

## 2016-03-11 NOTE — Progress Notes (Signed)
Debbie Reyes Kitchen      HEMATOLOGY/ONCOLOGY PROGRESS NOTE  Date of Service: 03/16/2016  Patient Care Team: Zella Richer. Scotty Court, MD as PCP - General (Unknown Physician Specialty)  CHIEF COMPLAINTS:  Continued management of metastatic breast cancer    Metastatic breast cancer (Debbie Reyes)   06/07/2005 Initial Biopsy    Biopsy L axillary nodal mass, no breast primary identified      08/11/2005 -  Chemotherapy    AC x 4, with 20% dose reduction on last cycle      11/21/2005 - 12/30/2005 Radiation Therapy    XRT 5040 cGY to L breast and L axillae with Dr. Isidore Moos      11/21/2005 - 12/31/2010 Anti-estrogen oral therapy    Arimidex X 5 years      01/20/2011 PET scan    Widespread hypermetabolic osseous metastatic disease. Index lesions are given above. No evidence of hypermetabolic metastatic disease involving the neck, chest, abdomen or pelvis.       01/25/2011 -  Radiation Therapy    1200 cGy to the left rib and 2050 cGy in 20 fractions to the T5-T10 vertebral bodies with a boost of 675 cGy in 3 fractions           01/25/2011 Treatment Plan Change    Faslodex initiated approximately on this date then stopped. Stopped due to failure to respond. Faslodex given in a dose of 250 mg only 3 doses (July 17, July 31, August 14) and a single 500 mg dose March 24, 2011          04/25/2011 - 10/13/2011 Chemotherapy    Taxol chemotherapy given weekly 2 with one week off. Stopped due to progression          10/22/2011 - 05/11/2012 Anti-estrogen oral therapy    Aromasin and everolimus initiating but stopped due to progression          11/11/2011 Initial Biopsy    L3 vertebral body biopsy ER+PR+      11/11/2011 Procedure    Status post vertebral body augmentation for painful pathologic compression fractures at T11 and at L1 using the balloon kyphoplasty technique. Status post vertebral body augmentation for painful pathologic compression fracture at T8 using the vertebroplasty technique.      12/09/2012 - 06/10/2013 Anti-estrogen oral therapy    Fareston      04/22/2013 PET scan    Hypermetabolic left upper lobe nodule, possibly metastatic. The possibility of primary bronchogenic carcinoma is also considered. Residual hypermetabolic metastatic disease involving the liver and bones. Small left pleural effusion, stable or minimally increased from 03/07/2013.      06/11/2013 - 01/08/2014 Chemotherapy    Abraxane      01/31/2014 PET scan    Progressive metastatic lymphadenopathy within the right supraclavicular region and mediastinum.New hypermetabolic left adrenal metastasis. Mixed response of hypermetabolic liver metastases. Mild decrease in hypermetabolic activity associated with 1 cm left upper lobe pulmonary nodule. Diffuse bone metastases, with several in the thorax showing increased hypermetabolic activity consistent with mild progression of bone metastases.      02/01/2014 - 05/06/2015 Chemotherapy    XELODA 750 mg/m2 7 on/7 off       06/11/2014 PET scan    Partial metabolic response. Improving mediastinal lymphadenopathy, as above. Improving left adrenal metastasis. Prior hepatic metastasis is no longer evident.Improving multifocal osseous metastases, as above      05/04/2015 PET scan    Dominant finding is new widespread skeletal metastasis involvingthe axillary and appendicular skeleton. New lesions are not  discretely seen on the CT but recognized as new metabolic activity. New LEFT adrenal gland hypermetabolic metastasis. Foci of metabolic activity in the LEFT lung are felt to relate to atelectasis and/or infection. No clear evidence of pulmonary metastasis Moderate pleural LEFT pleural which could be sampled for cytology if clinically relevant.LEFT adrenal metastasis is actually an enlarged LEFT periaortic lymph node. This lymph node was enlarged and hypermetabolic to a greater degree FDG PET 01/31/2014 and subsequently improved and now has recurred on current exam  (05/04/2015) as a hypermetabolic enlarged LEFT periaortic lymph node.      05/04/2015 Progression    PET with progression of disease      05/06/2015 Treatment Plan Change    Megace      08/14/2015 PET scan    Suspect new hypermetabolic metastasis in posterior right hepatic lobe. Consider abdomen MRI without and with contrast for further evaluation. No significant change in probable malignant left pleural effusion and left lung atelectasis.No significant change in diffuse osseous metastatic disease. Stable hypermetabolic left abdominal paraaortic lymph node, consistent with metastatic disease      09/09/2015 - 03/07/2016 Chemotherapy    IBRANCE/FASLODEX . Ibrance at 100 mg secondary to grade 3 neutropenia      03/03/2016 PET scan    Interval overall progression of disease. Interval development of new and progression of pre-existing lesions in the liver, hypermetabolic on PET imaging, consistent with metastatic disease. Persistent hypermetabolic patchy opacity in the anterior left upper lobe with persistent chronic left pleural effusion. Diffuse bony metastases again noted with interval progression ofFDG uptake associated with the index bone lesions.      03/03/2016 Progression    PET demonstrates progression of disease.      03/11/2016 -  Chemotherapy    The patient had PACLitaxel-protein bound (ABRAXANE) chemo infusion 150 mg, 100 mg/m2 = 150 mg, Intravenous,  Once, 8 of 8 cycles  filgrastim (NEUPOGEN) injection 300 mcg, 300 mcg, Subcutaneous,  Once, 1 of 4 cycles  eriBULin mesylate (HALAVEN) 2 mg in sodium chloride 0.9 % 100 mL chemo infusion, 1.4 mg/m2 = 2 mg, Intravenous,  Once, 1 of 4 cycles  for chemotherapy treatment.          HISTORY OF PRESENTING ILLNESS:   Debbie Reyes is a wonderful 75 y.o. female who has been referred to [place] by Dr .[name], MD /Dr Tressie Stalker for continued management of metastatic breast cancer.  She was treated with halaven last week and has  done quite well. [name] is unaccompanied. I personally reviewed and went over laboratory studies with the patient. She is neutropenic.   She denies any nausea or diarrhea. She is not eating well but she is trying. Notes Debbie Reyes has ordered her some Ensure.  No fever or rash. She presents without complaint today. She is here to assess tolerance to her first treatment.    MEDICAL HISTORY:  Past Medical History:  Diagnosis Date  . Bone metastases ([place]) 12/29/2015  . Cancer ([place])   . Metastatic breast cancer ([place]) 12/29/2015  Depression   anxiety   SURGICAL HISTORY: L1 bone metastases biopsy  Axillary lymph node biopsy   SOCIAL HISTORY: Social History   Social History  . Marital status: Widowed    Spouse name: N/A  . Number of children: N/A  . Years of education: N/A   Occupational History  . Not on file.   Social History Main Topics  . Smoking status: Never Smoker  . Smokeless tobacco: Never Used  .  Alcohol use No  . Drug use: No  . Sexual activity: No   Other Topics Concern  . Not on file   Social History Narrative  . No narrative on file    FAMILY HISTORY: Patient denies family history of breast cancer, ovarian cancer or uterine cancer . Denies any family history of blood clotting or bleeding disorders   ALLERGIES:  has No Known Allergies.  MEDICATIONS:  Current Outpatient Prescriptions  Medication Sig Dispense Refill  . aspirin EC 81 MG tablet Take 81 mg by mouth daily.    Debbie Reyes [facility] denosumab (XGEVA) 120 MG/1.7ML SOLN Inject 120 mg into the skin once. Pt took on 10/13/11    . EriBULin Mesylate (HALAVEN IV) Inject into the vein. Day 1 day 8, every 21 days    . HYDROcodone-acetaminophen (NORCO/VICODIN) 5-325 MG tablet Take 1 tablet by mouth every 6 (six) hours as needed. For pain 100 tablet 0  . lidocaine-prilocaine (EMLA) cream Apply topically as needed. 1 each 2  . LORazepam (ATIVAN) 1 MG tablet Take 1 mg by mouth at bedtime as needed. For sleep    . megestrol  (MEGACE) 400 MG/10ML suspension Take 10 mLs (400 mg total) by mouth daily. 240 mL 1  . Melatonin 1 MG CAPS Take by mouth at bedtime as needed.    . ondansetron (ZOFRAN) 8 MG tablet Take 1 tablet (8 mg total) by mouth 2 (two) times daily as needed (Nausea or vomiting). 30 tablet 1  . PARoxetine (PAXIL) 20 MG tablet Take 20 mg by mouth daily.    . prochlorperazine (COMPAZINE) 10 MG tablet Take 1 tablet (10 mg total) by mouth every 6 (six) hours as needed (Nausea or vomiting). 30 tablet 1  . propranolol (INDERAL) 20 MG tablet Take 20 mg by mouth 2 (two) times daily.     No current facility-administered medications for this visit.     REVIEW OF SYSTEMS:   14 point review of systems was performed and is negative except as detailed under history of present illness and above   PHYSICAL EXAMINATION: ECOG PERFORMANCE STATUS: 1 - Symptomatic but completely ambulatory  . Vitals:   03/16/16 1227  BP: 112/65  Pulse: (!) 103  Resp: 18  Temp: 97.8 F (36.6 C)   Filed Weights   03/16/16 1227  Weight: 105 lb 9.6 oz (47.9 kg)   .Body mass index is 20.62 kg/m.  GENERAL:alert, in no acute distress and comfortable SKIN: skin color, texture, turgor are normal, no rashes or significant lesions EYES: normal, conjunctiva are pink and non-injected, sclera clear OROPHARYNX:no exudate, no erythema and lips, buccal mucosa, and tongue normal  NECK: supple, no JVD, thyroid normal size, non-tender, without nodularity LYMPH:  no palpable lymphadenopathy in the cervical, axillary or inguinal or supraclavicular LN LUNGS: clear to auscultation with normal respiratory effort HEART: regular rate & rhythm,  no murmurs and  trace pedal edema. ABDOMEN: abdomen soft, non-tender, normoactive bowel sounds  Musculoskeletal: no cyanosis of digits and no clubbing  PSYCH: alert & oriented x 3 with fluent speech NEURO: no focal motor/sensory deficits  LABORATORY DATA:  I have reviewed the data as listed Results for  Debbie Reyes, Debbie Reyes (MRN [id number]) as of 03/18/2016 20:45  Ref. Range 03/16/2016 11:01  Sodium Latest Ref Range: 135 - 145 mmol/L 133 (L)  Potassium Latest Ref Range: 3.5 - 5.1 mmol/L 4.3  Chloride Latest Ref Range: 101 - 111 mmol/L 96 (L)  CO2 Latest Ref Range: 22 - 32 mmol/L 27  BUN Latest  Ref Range: 6 - 20 mg/dL 11  Creatinine Latest Ref Range: 0.44 - 1.00 mg/dL 0.93  Calcium Latest Ref Range: 8.9 - 10.3 mg/dL 9.4  EGFR (Non-African Amer.) Latest Ref Range: >60 mL/min 59 (L)  EGFR (African American) Latest Ref Range: >60 mL/min >60  Glucose Latest Ref Range: 65 - 99 mg/dL 125 (H)  Anion gap Latest Ref Range: 5 - 15  10  Alkaline Phosphatase Latest Ref Range: 38 - 126 U/L 57  Albumin Latest Ref Range: 3.5 - 5.0 g/dL 3.8  AST Latest Ref Range: 15 - 41 U/L 33  ALT Latest Ref Range: 14 - 54 U/L 8 (L)  Total Protein Latest Ref Range: 6.5 - 8.1 g/dL 7.0  Total Bilirubin Latest Ref Range: 0.3 - 1.2 mg/dL 0.4  WBC Latest Ref Range: 4.0 - 10.5 K/uL 1.8 (L)  RBC Latest Ref Range: 3.87 - 5.11 MIL/uL 2.42 (L)  Hemoglobin Latest Ref Range: 12.0 - 15.0 g/dL 8.7 (L)  HCT Latest Ref Range: 36.0 - 46.0 % 26.0 (L)  MCV Latest Ref Range: 78.0 - 100.0 fL 107.4 (H)  MCH Latest Ref Range: 26.0 - 34.0 pg 36.0 (H)  MCHC Latest Ref Range: 30.0 - 36.0 g/dL 33.5  RDW Latest Ref Range: 11.5 - 15.5 % 17.6 (H)  Platelets Latest Ref Range: 150 - 400 K/uL 117 (L)  Neutrophils Latest Units: % 31  Lymphocytes Latest Units: % 41  Monocytes Relative Latest Units: % 23  Eosinophil Latest Units: % 1  Basophil Latest Units: % 4  NEUT# Latest Ref Range: 1.7 - 7.7 K/uL 0.6 (L)  Lymphocyte # Latest Ref Range: 0.7 - 4.0 K/uL 0.7  Monocyte # Latest Ref Range: 0.1 - 1.0 K/uL 0.4  Eosinophils Absolute Latest Ref Range: 0.0 - 0.7 K/uL 0.0  Basophils Absolute Latest Ref Range: 0.0 - 0.1 K/uL 0.1  RBC Morphology Unknown POLYCHROMASIA PRE...  WBC Morphology Unknown WHITE COUNT CONFI...  Smear Review Unknown PLATELET COUNT CO...    CA 15-3 Latest Ref Range: 0.0 - 25.0 U/mL 681.6 (H)  CA 27.29 Latest Ref Range: 0.0 - 38.6 U/mL 833.8 (H)  . Results for Debbie Reyes, Debbie Reyes (MRN [id number]) as of 03/18/2016 20:45  Ref. Range 01/18/2016 11:45 03/16/2016 11:01  CA 15-3 Latest Ref Range: 0.0 - 25.0 U/mL 451.9 (H) 681.6 (H)   Results for Debbie Reyes, Debbie Reyes (MRN [id number]) as of 03/18/2016 20:45  Ref. Range 01/18/2016 11:45 02/17/2016 13:06 03/16/2016 11:01  CA 27.29 Latest Ref Range: 0.0 - 38.6 U/mL 392.4 (H) 681.1 (H) 833.8 (H)       RADIOGRAPHIC STUDIES: I have personally reviewed the radiological images as listed and agreed with the findings in the report. Nm Pet Image Restag (ps) Skull Base To Thigh  Result Date: 03/01/2016 CLINICAL DATA:  Subsequent treatment strategy for breast cancer. EXAM: NUCLEAR MEDICINE PET SKULL BASE TO THIGH TECHNIQUE: 5.3 mCi F-18 FDG was injected intravenously. Full-ring PET imaging was performed from the skull base to thigh after the radiotracer. CT data was obtained and used for attenuation correction and anatomic localization. FASTING BLOOD GLUCOSE:  Value: 106 mg/dl COMPARISON:  12/15/2015. FINDINGS: NECK Increased uptake in the left sternocleidomastoid muscle, likely related to patient motion. CHEST The nodular density seen previously in the anterior left upper lobe (image 35 series 8 today) measures 6 mm today with SUV max = 6.3 compared to 6.0 previously. The bandlike linear atelectasis scarring in the anterior left upper lobe is stable without substantial hypermetabolism. Chronic left pleural effusion with  pleural thickening again noted. There is activity in the posterior aspect of the collapse/ consolidation at the left lung base, decreased slightly in the interval. Small right pleural effusion is stable. Right Port-A-Cath tip projects at the SVC/RA junction. ABDOMEN/PELVIS Interval development of increasing and new hypermetabolic metastases in the liver. 17 mm hypermetabolic lesion in the tip of the lateral  segment left liver was 5 mm previously. SUV max = 8.0 on today's exam. 6 mm hypermetabolic lesions seen in the medial right liver on the previous study has progressed 17 mm today (image 91 series 4) with SUV max = 6.0. 13 mm hypodense lesion seen previously in the inferior right hepatic lobe is stable. As before, this lesion is not substantially hypermetabolic. There is abdominal aortic atherosclerosis without aneurysm. SKELETON Multiple hypermetabolic bony metastases are evident. Sternal lesion measured previously has SUV max = 8.1 today compared to 6.4 previously. Mid thoracic vertebral body lesion with SUV max = 7.4 today compared to 5.3 previously. Index lesion in the right iliac crest is 14.5 today compared to 10.3 previously. IMPRESSION: 1. Interval overall progression of disease. 2. Interval development of new and progression of pre-existing lesions in the liver, hypermetabolic on PET imaging, consistent with metastatic disease. 3. Persistent hypermetabolic patchy opacity in the anterior left upper lobe with persistent chronic left pleural effusion. 4. Diffuse bony metastases again noted with interval progression of FDG uptake associated with the index bone lesions. Electronically Signed   By: Misty Stanley M.D.   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 She has just completed ibrance, her neutropenia is not unexpected given this in conjunction with all of her prior therapies. Will support her with neupogen and hopefully can retreat on Friday.  Will make further recommendations regarding dosing and neupogen support after her next treatment.  She has low B12, will replace her with B12 IM X 4. Folic acid is also marginal and will have her start OTC  folic acid.   Continue XGEVA, calcium and vitamin D.  RTC Friday with labs and consideration of ongoing therapy.   RTC on 9/22 for ongoing follow-up.   All of the patients questions were answered to her apparent satisfaction. The patient knows to call the clinic with any problems, questions or concerns.  This document serves as a record of services personally performed by Ancil Linsey, MD. It was created on her behalf by Arlyce Harman, a trained medical scribe. The creation of this record is based on the scribe's personal observations and the provider's statements to them. This document has been checked and approved by the attending provider.  I have reviewed the above documentation for accuracy and completeness and I agree with the above.  Kelby Fam. Whitney Muse, MD

## 2016-03-13 ENCOUNTER — Encounter (HOSPITAL_COMMUNITY): Payer: Self-pay | Admitting: Hematology & Oncology

## 2016-03-15 ENCOUNTER — Telehealth (HOSPITAL_COMMUNITY): Payer: Self-pay | Admitting: *Deleted

## 2016-03-15 ENCOUNTER — Other Ambulatory Visit (HOSPITAL_COMMUNITY): Payer: Medicare Other

## 2016-03-15 NOTE — Telephone Encounter (Signed)
Spoke with patient. Denies any complaints post Halaven initial dose.

## 2016-03-16 ENCOUNTER — Encounter (HOSPITAL_BASED_OUTPATIENT_CLINIC_OR_DEPARTMENT_OTHER): Payer: Medicare Other | Admitting: Hematology & Oncology

## 2016-03-16 ENCOUNTER — Other Ambulatory Visit (HOSPITAL_COMMUNITY): Payer: Medicare Other

## 2016-03-16 ENCOUNTER — Encounter (HOSPITAL_COMMUNITY): Payer: Medicare Other

## 2016-03-16 ENCOUNTER — Encounter (HOSPITAL_COMMUNITY): Payer: Self-pay | Admitting: Hematology & Oncology

## 2016-03-16 ENCOUNTER — Encounter: Payer: Self-pay | Admitting: Dietician

## 2016-03-16 ENCOUNTER — Encounter (HOSPITAL_BASED_OUTPATIENT_CLINIC_OR_DEPARTMENT_OTHER): Payer: Medicare Other

## 2016-03-16 VITALS — BP 112/65 | HR 103 | Temp 97.8°F | Resp 18 | Wt 105.6 lb

## 2016-03-16 DIAGNOSIS — D701 Agranulocytosis secondary to cancer chemotherapy: Secondary | ICD-10-CM | POA: Diagnosis present

## 2016-03-16 DIAGNOSIS — C50919 Malignant neoplasm of unspecified site of unspecified female breast: Secondary | ICD-10-CM

## 2016-03-16 DIAGNOSIS — C50912 Malignant neoplasm of unspecified site of left female breast: Secondary | ICD-10-CM | POA: Diagnosis not present

## 2016-03-16 DIAGNOSIS — D702 Other drug-induced agranulocytosis: Secondary | ICD-10-CM

## 2016-03-16 DIAGNOSIS — C778 Secondary and unspecified malignant neoplasm of lymph nodes of multiple regions: Secondary | ICD-10-CM | POA: Diagnosis not present

## 2016-03-16 DIAGNOSIS — C7951 Secondary malignant neoplasm of bone: Secondary | ICD-10-CM

## 2016-03-16 DIAGNOSIS — C7972 Secondary malignant neoplasm of left adrenal gland: Secondary | ICD-10-CM | POA: Diagnosis not present

## 2016-03-16 DIAGNOSIS — D72819 Decreased white blood cell count, unspecified: Secondary | ICD-10-CM

## 2016-03-16 DIAGNOSIS — E538 Deficiency of other specified B group vitamins: Secondary | ICD-10-CM

## 2016-03-16 DIAGNOSIS — C799 Secondary malignant neoplasm of unspecified site: Secondary | ICD-10-CM | POA: Diagnosis not present

## 2016-03-16 DIAGNOSIS — D6481 Anemia due to antineoplastic chemotherapy: Secondary | ICD-10-CM

## 2016-03-16 DIAGNOSIS — D7589 Other specified diseases of blood and blood-forming organs: Secondary | ICD-10-CM

## 2016-03-16 LAB — COMPREHENSIVE METABOLIC PANEL
ALBUMIN: 3.8 g/dL (ref 3.5–5.0)
ALT: 8 U/L — ABNORMAL LOW (ref 14–54)
ANION GAP: 10 (ref 5–15)
AST: 33 U/L (ref 15–41)
Alkaline Phosphatase: 57 U/L (ref 38–126)
BILIRUBIN TOTAL: 0.4 mg/dL (ref 0.3–1.2)
BUN: 11 mg/dL (ref 6–20)
CHLORIDE: 96 mmol/L — AB (ref 101–111)
CO2: 27 mmol/L (ref 22–32)
Calcium: 9.4 mg/dL (ref 8.9–10.3)
Creatinine, Ser: 0.93 mg/dL (ref 0.44–1.00)
GFR calc Af Amer: >60 mL/min (ref >60)
GFR calc non Af Amer: 59 mL/min — ABNORMAL LOW (ref >60)
GLUCOSE: 125 mg/dL — AB (ref 65–99)
POTASSIUM: 4.3 mmol/L (ref 3.5–5.1)
SODIUM: 133 mmol/L — AB (ref 135–145)
TOTAL PROTEIN: 7 g/dL (ref 6.5–8.1)

## 2016-03-16 LAB — CBC WITH DIFFERENTIAL/PLATELET
BASOS ABS: 0.1 10*3/uL (ref 0.0–0.1)
Basophils Relative: 4 %
EOS ABS: 0 10*3/uL (ref 0.0–0.7)
Eosinophils Relative: 1 %
HCT: 26 % — ABNORMAL LOW (ref 36.0–46.0)
Hemoglobin: 8.7 g/dL — ABNORMAL LOW (ref 12.0–15.0)
LYMPHS PCT: 41 %
Lymphs Abs: 0.7 10*3/uL (ref 0.7–4.0)
MCH: 36 pg — ABNORMAL HIGH (ref 26.0–34.0)
MCHC: 33.5 g/dL (ref 30.0–36.0)
MCV: 107.4 fL — ABNORMAL HIGH (ref 78.0–100.0)
MONO ABS: 0.4 10*3/uL (ref 0.1–1.0)
Monocytes Relative: 23 %
NEUTROS PCT: 31 %
Neutro Abs: 0.6 10*3/uL — ABNORMAL LOW (ref 1.7–7.7)
PLATELETS: 117 10*3/uL — AB (ref 150–400)
RBC: 2.42 MIL/uL — AB (ref 3.87–5.11)
RDW: 17.6 % — AB (ref 11.5–15.5)
WBC: 1.8 10*3/uL — AB (ref 4.0–10.5)

## 2016-03-16 MED ORDER — FILGRASTIM 300 MCG/ML IJ SOLN: 300.0000 ug | Freq: Once | INTRAMUSCULAR | Status: DC

## 2016-03-16 MED ORDER — FILGRASTIM 300 MCG/ML IJ SOLN
300.0000 ug | Freq: Once | INTRAMUSCULAR | Status: AC
  Administered 2016-03-16: 300 ug via SUBCUTANEOUS
  Filled 2016-03-16: qty 1

## 2016-03-16 MED ORDER — FILGRASTIM 480 MCG/0.8ML IJ SOSY: 300.0000 ug | PREFILLED_SYRINGE | Freq: Once | INTRAMUSCULAR | Status: DC

## 2016-03-16 NOTE — Addendum Note (Signed)
Addended by: Donnie Aho on: 03/16/2016 01:04 PM   Modules accepted: Orders

## 2016-03-16 NOTE — Addendum Note (Signed)
Addended by: Cathlean Marseilles on: 03/16/2016 01:06 PM   Modules accepted: Orders

## 2016-03-16 NOTE — Progress Notes (Signed)
AHSLEY HEIST presents today for injection per MD orders. Neupogen 313mcg administered SQ in right Abdomen. Administration without incident. Patient tolerated well.

## 2016-03-16 NOTE — Progress Notes (Signed)
Patient identified to be at risk for malnutrition on the MST secondary to Wt loss and appetite  History of Stage 4 breast cancer for over 5 years now w/ most recent PET showing continued disease progression..   Contacted Pt by visiting prior to office visit  Wt Readings from Last 10 Encounters:  03/11/16 106 lb (48.1 kg)  03/07/16 106 lb 6.4 oz (48.3 kg)  02/12/16 107 lb 12.8 oz (48.9 kg)  04/22/13 109 lb (49.4 kg)  11/11/11 124 lb (56.2 kg)   Per Care Everywhere documentation. Pt weighed 111 lbs in April. She was weighed today at 105.6 lbs.   Patient reports a dismal PO intake. She tries to eat 2x a day and these are, by her perspective, small meals. She says it was due to the [place].   Denies any n/v/c/d.   We discussed supplements. She had been given samples at a past visit and reported them as "so-so". RD went over the Ensure Program. She was ok with ordering a case today which will be available for her next visit.   Because appetite is her biggest obstacle, RD asked about appetite stimulants. Apparently she was given megace at her last visit, but has not started it yet. Will follow up as she starts this medication. May take a few weeks to start having affect. She is told the goal is to start eating at least 3x a day.   She did not seem too interested in talking today. She is given RD contact information if she has any nutritional questions.   Left my contact info, coupons, and handouts titled "Increasing Calories and Protein"  Burtis Junes RD, LDN, [facility] Pager: [phone] 03/16/2016 12:19 PM

## 2016-03-16 NOTE — Patient Instructions (Signed)
[place] at [facility] Discharge Instructions  RECOMMENDATIONS MADE BY THE CONSULTANT AND ANY TEST RESULTS WILL BE SENT TO YOUR [place] saw Dr. Whitney Muse today. Neupogen today and tomorrow. Labs on Friday 03/18/16. If counts allow will treat, if not will delay to next Friday. Follow up on 9/22  Thank you for choosing [place] at [facility] to provide your oncology and hematology care.  To afford each patient quality time with our provider, please arrive at least 15 minutes before your scheduled appointment time.   Beginning January 23rd 2017 lab work for the [facility] will be done in the  Main lab at [facility] on 1st floor. If you have a lab appointment with the [facility] please come in thru the  Main Entrance and check in at the main information desk  You need to re-schedule your appointment should you arrive 10 or more minutes late.  We strive to give you quality time with our providers, and arriving late affects you and other patients whose appointments are after yours.  Also, if you no show three or more times for appointments you may be dismissed from the clinic at the providers discretion.     Again, thank you for choosing [facility].  Our hope is that these requests will decrease the amount of time that you wait before being seen by our physicians.       _____________________________________________________________  Should you have questions after your visit to [facility], please contact our office at [phone] between the hours of 8:30 a.m. and 4:30 p.m.  Voicemails left after 4:30 p.m. will not be returned until the following business day.  For prescription refill requests, have your pharmacy contact our office.         Resources For Cancer Patients and their Caregivers ? American Cancer Society: Can assist with transportation, wigs, general needs, runs Look Good Feel  Better.        (301)865-3911 ? Cancer Care: Provides financial assistance, online support groups, medication/co-pay assistance.  1-800-813-HOPE 901-520-0307) ? [facility] Assists [place] cancer patients and their families through emotional , educational and financial support.  425-298-9159 ? [place] DSS Where to apply for food stamps, Medicaid and utility assistance. 236-639-1241 ? [facility]: Transportation to medical appointments. (712)852-4641 ? Social Security Administration: May apply for disability if have a Stage IV cancer. 445-427-3383 (336) 629-0365 ? [employer], Disability and Transit Services: Assists with nutrition, care and transit needs. [place] Support Programs: @10RELATIVEDAYS @ > Cancer Support Group  2nd Tuesday of the month 1pm-2pm, Journey Room  > Creative Journey  3rd Tuesday of the month 1130am-1pm, Journey Room  > Look Good Feel Better  1st Wednesday of the month 10am-12 noon, Journey Room (Call [facility] to register [phone])

## 2016-03-16 NOTE — Progress Notes (Signed)
Delton See not due today, was given on 03-11-2016

## 2016-03-16 NOTE — Patient Instructions (Signed)
[facility] at [facility] Discharge Instructions  RECOMMENDATIONS MADE BY THE CONSULTANT AND ANY TEST RESULTS WILL BE SENT TO YOUR REFERRING PHYSICIAN.  Neupagen given today per orders. Follow up as scheduled  Thank you for choosing  [facility] at [facility] to provide your oncology and hematology care.  To afford each patient quality time with our provider, please arrive at least 15 minutes before your scheduled appointment time.   Beginning January 23rd 2017 lab work for the Cancer Center will be done in the  Main lab at [place] on 1st floor. If you have a lab appointment with the Cancer Center please come in thru the  Main Entrance and check in at the main information desk  You need to re-schedule your appointment should you arrive 10 or more minutes late.  We strive to give you quality time with our providers, and arriving late affects you and other patients whose appointments are after yours.  Also, if you no show three or more times for appointments you may be dismissed from the clinic at the providers discretion.     Again, thank you for choosing [facility].  Our hope is that these requests will decrease the amount of time that you wait before being seen by our physicians.       _____________________________________________________________  Should you have questions after your visit to [facility], please contact our office at [phone] between the hours of 8:30 a.m. and 4:30 p.m.  Voicemails left after 4:30 p.m. will not be returned until the following business day.  For prescription refill requests, have your pharmacy contact our office.         Resources For Cancer Patients and their Caregivers ? American Cancer Society: Can assist with transportation, wigs, general needs, runs Look Good Feel Better.        1-888-227-6333 ? Cancer Care: Provides financial assistance, online support groups,  medication/co-pay assistance.  1-800-813-HOPE (4673) ? [facility] Assists [place] cancer patients and their families through emotional , educational and financial support.  336-427-4357 ? [place] DSS Where to apply for food stamps, Medicaid and utility assistance. 336-342-1394 ? RCATS: Transportation to medical appointments. 336-347-2287 ? Social Security Administration: May apply for disability if have a Stage IV cancer. 336-342-7796 1-800-772-1213 ? [place] Aging, Disability and Transit Services: Assists with nutrition, care and transit needs. 336-349-2343  Cancer Center Support Programs: @10RELATIVEDAYS@ > Cancer Support Group  2nd Tuesday of the month 1pm-2pm, Journey Room  > Creative Journey  3rd Tuesday of the month 1130am-1pm, Journey Room  > Look Good Feel Better  1st Wednesday of the month 10am-12 noon, Journey Room (Call American Cancer Society to register 1-800-395-5775)   

## 2016-03-16 NOTE — Addendum Note (Signed)
Addended by: Cathlean Marseilles on: 03/16/2016 01:10 PM   Modules accepted: Orders

## 2016-03-17 ENCOUNTER — Encounter (HOSPITAL_BASED_OUTPATIENT_CLINIC_OR_DEPARTMENT_OTHER): Payer: Medicare Other

## 2016-03-17 ENCOUNTER — Encounter (HOSPITAL_COMMUNITY): Payer: Self-pay

## 2016-03-17 DIAGNOSIS — C50612 Malignant neoplasm of axillary tail of left female breast: Secondary | ICD-10-CM | POA: Diagnosis present

## 2016-03-17 DIAGNOSIS — Z5189 Encounter for other specified aftercare: Secondary | ICD-10-CM | POA: Diagnosis not present

## 2016-03-17 LAB — CANCER ANTIGEN 27.29: CA 27.29: 833.8 U/mL — ABNORMAL HIGH (ref 0.0–38.6)

## 2016-03-17 LAB — CANCER ANTIGEN 15-3: CA 15-3: 681.6 U/mL — ABNORMAL HIGH (ref 0.0–25.0)

## 2016-03-17 MED ORDER — FILGRASTIM 300 MCG/0.5ML IJ SOSY
300.0000 ug | PREFILLED_SYRINGE | Freq: Once | INTRAMUSCULAR | Status: AC
  Administered 2016-03-17: 300 ug via SUBCUTANEOUS
  Filled 2016-03-17: qty 0.5

## 2016-03-17 NOTE — Patient Instructions (Signed)
[facility] at [facility] Discharge Instructions  RECOMMENDATIONS MADE BY THE CONSULTANT AND ANY TEST RESULTS WILL BE SENT TO YOUR REFERRING PHYSICIAN.  You were given a Neupogen injection today. Return as scheduled.   Thank you for choosing [facility] at [facility] to provide your oncology and hematology care.  To afford each patient quality time with our provider, please arrive at least 15 minutes before your scheduled appointment time.   Beginning January 23rd 2017 lab work for the Cancer Center will be done in the  Main lab at [place] on 1st floor. If you have a lab appointment with the Cancer Center please come in thru the  Main Entrance and check in at the main information desk  You need to re-schedule your appointment should you arrive 10 or more minutes late.  We strive to give you quality time with our providers, and arriving late affects you and other patients whose appointments are after yours.  Also, if you no show three or more times for appointments you may be dismissed from the clinic at the providers discretion.     Again, thank you for choosing [facility].  Our hope is that these requests will decrease the amount of time that you wait before being seen by our physicians.       _____________________________________________________________  Should you have questions after your visit to [facility], please contact our office at [phone] between the hours of 8:30 a.m. and 4:30 p.m.  Voicemails left after 4:30 p.m. will not be returned until the following business day.  For prescription refill requests, have your pharmacy contact our office.         Resources For Cancer Patients and their Caregivers ? American Cancer Society: Can assist with transportation, wigs, general needs, runs Look Good Feel Better.        1-888-227-6333 ? Cancer Care: Provides financial assistance, online support  groups, medication/co-pay assistance.  1-800-813-HOPE (4673) ? [facility] Assists [place] cancer patients and their families through emotional , educational and financial support.  336-427-4357 ? [place] DSS Where to apply for food stamps, Medicaid and utility assistance. 336-342-1394 ? RCATS: Transportation to medical appointments. 336-347-2287 ? Social Security Administration: May apply for disability if have a Stage IV cancer. 336-342-7796 1-800-772-1213 ? [place] Aging, Disability and Transit Services: Assists with nutrition, care and transit needs. 336-349-2343  Cancer Center Support Programs: @10RELATIVEDAYS@ > Cancer Support Group  2nd Tuesday of the month 1pm-2pm, Journey Room  > Creative Journey  3rd Tuesday of the month 1130am-1pm, Journey Room  > Look Good Feel Better  1st Wednesday of the month 10am-12 noon, Journey Room (Call American Cancer Society to register 1-800-395-5775)   

## 2016-03-17 NOTE — Progress Notes (Signed)
Pt here today for Neupogen injection. Pt given injection in left lower abdomen. Pt tolerated well. Pt to return as scheduled. Pt stable and discharged home ambulatory.

## 2016-03-18 ENCOUNTER — Ambulatory Visit (HOSPITAL_COMMUNITY): Payer: Medicare Other

## 2016-03-18 ENCOUNTER — Encounter (HOSPITAL_COMMUNITY): Payer: Self-pay | Admitting: Hematology & Oncology

## 2016-03-18 ENCOUNTER — Encounter (HOSPITAL_BASED_OUTPATIENT_CLINIC_OR_DEPARTMENT_OTHER): Payer: Medicare Other

## 2016-03-18 ENCOUNTER — Encounter (HOSPITAL_COMMUNITY): Payer: Medicare Other

## 2016-03-18 VITALS — BP 109/50 | HR 87 | Temp 97.9°F | Resp 16

## 2016-03-18 DIAGNOSIS — E538 Deficiency of other specified B group vitamins: Secondary | ICD-10-CM

## 2016-03-18 DIAGNOSIS — C7951 Secondary malignant neoplasm of bone: Secondary | ICD-10-CM

## 2016-03-18 DIAGNOSIS — C50919 Malignant neoplasm of unspecified site of unspecified female breast: Secondary | ICD-10-CM

## 2016-03-18 DIAGNOSIS — D702 Other drug-induced agranulocytosis: Secondary | ICD-10-CM

## 2016-03-18 DIAGNOSIS — C799 Secondary malignant neoplasm of unspecified site: Secondary | ICD-10-CM | POA: Diagnosis not present

## 2016-03-18 DIAGNOSIS — Z5111 Encounter for antineoplastic chemotherapy: Secondary | ICD-10-CM | POA: Diagnosis not present

## 2016-03-18 HISTORY — DX: Deficiency of other specified B group vitamins: E53.8

## 2016-03-18 LAB — CBC WITH DIFFERENTIAL/PLATELET
BASOS PCT: 2 %
Basophils Absolute: 0.1 10*3/uL (ref 0.0–0.1)
Eosinophils Absolute: 0 10*3/uL (ref 0.0–0.7)
Eosinophils Relative: 0 %
HEMATOCRIT: 25.8 % — AB (ref 36.0–46.0)
HEMOGLOBIN: 8.5 g/dL — AB (ref 12.0–15.0)
LYMPHS ABS: 1 10*3/uL (ref 0.7–4.0)
LYMPHS PCT: 20 %
MCH: 35.6 pg — AB (ref 26.0–34.0)
MCHC: 32.9 g/dL (ref 30.0–36.0)
MCV: 107.9 fL — ABNORMAL HIGH (ref 78.0–100.0)
MONOS PCT: 45 %
Monocytes Absolute: 2.4 10*3/uL — ABNORMAL HIGH (ref 0.1–1.0)
NEUTROS ABS: 1.7 10*3/uL (ref 1.7–7.7)
Neutrophils Relative %: 33 %
Platelets: 156 10*3/uL (ref 150–400)
RBC: 2.39 MIL/uL — ABNORMAL LOW (ref 3.87–5.11)
RDW: 18.2 % — AB (ref 11.5–15.5)
WBC: 5.2 10*3/uL (ref 4.0–10.5)

## 2016-03-18 LAB — COMPREHENSIVE METABOLIC PANEL
ALK PHOS: 59 U/L (ref 38–126)
ALT: 10 U/L — ABNORMAL LOW (ref 14–54)
ANION GAP: 6 (ref 5–15)
AST: 31 U/L (ref 15–41)
Albumin: 3.8 g/dL (ref 3.5–5.0)
BILIRUBIN TOTAL: 0.9 mg/dL (ref 0.3–1.2)
BUN: 12 mg/dL (ref 6–20)
CALCIUM: 8.7 mg/dL — AB (ref 8.9–10.3)
CO2: 25 mmol/L (ref 22–32)
Chloride: 99 mmol/L — ABNORMAL LOW (ref 101–111)
Creatinine, Ser: 0.97 mg/dL (ref 0.44–1.00)
GFR, EST NON AFRICAN AMERICAN: 56 mL/min — AB (ref >60)
Glucose, Bld: 91 mg/dL (ref 65–99)
Potassium: 3.8 mmol/L (ref 3.5–5.1)
Sodium: 130 mmol/L — ABNORMAL LOW (ref 135–145)
TOTAL PROTEIN: 7.1 g/dL (ref 6.5–8.1)

## 2016-03-18 MED ORDER — SODIUM CHLORIDE 0.9 % IV SOLN
1.4000 mg/m2 | Freq: Once | INTRAVENOUS | Status: AC
  Administered 2016-03-18: 2 mg via INTRAVENOUS
  Filled 2016-03-18: qty 2

## 2016-03-18 MED ORDER — HEPARIN SOD (PORK) LOCK FLUSH 100 UNIT/ML IV SOLN
500.0000 [IU] | Freq: Once | INTRAVENOUS | Status: AC | PRN
  Administered 2016-03-18: 500 [IU]
  Filled 2016-03-18: qty 5

## 2016-03-18 MED ORDER — SODIUM CHLORIDE 0.9% FLUSH: 10.0000 mL | INTRAVENOUS | Status: DC | PRN

## 2016-03-18 MED ORDER — ONDANSETRON HCL 4 MG PO TABS
8.0000 mg | ORAL_TABLET | Freq: Once | ORAL | Status: AC
Start: 2016-03-18 — End: 2016-03-18
  Administered 2016-03-18: 8 mg via ORAL

## 2016-03-18 MED ORDER — ONDANSETRON HCL 4 MG PO TABS
ORAL_TABLET | ORAL | Status: AC
  Filled 2016-03-18: qty 2

## 2016-03-18 MED ORDER — PROCHLORPERAZINE MALEATE 10 MG PO TABS
ORAL_TABLET | ORAL | Status: AC
  Filled 2016-03-18: qty 1

## 2016-03-18 MED ORDER — SODIUM CHLORIDE 0.9 % IV SOLN
Freq: Once | INTRAVENOUS | Status: AC
  Administered 2016-03-18: 16:00:00 via INTRAVENOUS

## 2016-03-18 MED ORDER — PROCHLORPERAZINE MALEATE 10 MG PO TABS
10.0000 mg | ORAL_TABLET | Freq: Once | ORAL | Status: AC
  Administered 2016-03-18: 10 mg via ORAL

## 2016-03-18 NOTE — Progress Notes (Signed)
Patient tolerated infusion well.  VSS.  Patient ambulatory and stable at time of discharge from clinic.

## 2016-03-18 NOTE — Patient Instructions (Signed)
[facility] Discharge Instructions for Patients Receiving Chemotherapy   Beginning January 23rd 2017 lab work for the [facility] will be done in the  Main lab at [facility] on 1st floor. If you have a lab appointment with the [place] please come in thru the  Main Entrance and check in at the main information desk   Today you received the following chemotherapy agent: [place].     If you develop nausea and vomiting, or diarrhea that is not controlled by your medication, call the clinic.  The clinic phone number is [phone]. Office hours are Monday-Friday 8:30am-5:00pm.  BELOW ARE SYMPTOMS THAT SHOULD BE REPORTED IMMEDIATELY:  *FEVER GREATER THAN 101.0 F  *CHILLS WITH OR WITHOUT FEVER  NAUSEA AND VOMITING THAT IS NOT CONTROLLED WITH YOUR NAUSEA MEDICATION  *UNUSUAL SHORTNESS OF BREATH  *UNUSUAL BRUISING OR BLEEDING  TENDERNESS IN MOUTH AND THROAT WITH OR WITHOUT PRESENCE OF ULCERS  *URINARY PROBLEMS  *BOWEL PROBLEMS  UNUSUAL RASH Items with * indicate a potential emergency and should be followed up as soon as possible. If you have an emergency after office hours please contact your primary care physician or go to the nearest emergency department.  Please call the clinic during office hours if you have any questions or concerns.   You may also contact the Patient Navigator at [phone] should you have any questions or need assistance in obtaining follow up care.      Resources For Cancer Patients and their Caregivers ? American Cancer Society: Can assist with transportation, wigs, general needs, runs Look Good Feel Better.        702-798-2945 ? Cancer Care: Provides financial assistance, online support groups, medication/co-pay assistance.  1-800-813-HOPE 785-603-3413) ? [place] Assists [place] cancer patients and their families through emotional , educational and financial support.   336 038 2599 ? [place] DSS Where to apply for food stamps, Medicaid and utility assistance. 743-678-1280 ? RCATS: Transportation to medical appointments. 815-850-8113 ? Social Security Administration: May apply for disability if have a Stage IV cancer. [phone] [phone] ? [employer], Disability and Transit Services: Assists with nutrition, care and transit needs. 807-536-4529

## 2016-03-21 ENCOUNTER — Encounter (HOSPITAL_BASED_OUTPATIENT_CLINIC_OR_DEPARTMENT_OTHER): Payer: Medicare Other

## 2016-03-21 ENCOUNTER — Other Ambulatory Visit (HOSPITAL_COMMUNITY): Payer: Self-pay

## 2016-03-21 ENCOUNTER — Encounter (HOSPITAL_COMMUNITY): Payer: Self-pay

## 2016-03-21 VITALS — BP 132/59 | HR 76 | Temp 98.2°F | Resp 16

## 2016-03-21 DIAGNOSIS — C50919 Malignant neoplasm of unspecified site of unspecified female breast: Secondary | ICD-10-CM

## 2016-03-21 DIAGNOSIS — E538 Deficiency of other specified B group vitamins: Secondary | ICD-10-CM

## 2016-03-21 DIAGNOSIS — C50912 Malignant neoplasm of unspecified site of left female breast: Secondary | ICD-10-CM

## 2016-03-21 DIAGNOSIS — C7951 Secondary malignant neoplasm of bone: Secondary | ICD-10-CM

## 2016-03-21 DIAGNOSIS — D702 Other drug-induced agranulocytosis: Secondary | ICD-10-CM

## 2016-03-21 DIAGNOSIS — D701 Agranulocytosis secondary to cancer chemotherapy: Secondary | ICD-10-CM

## 2016-03-21 MED ORDER — PAROXETINE HCL 20 MG PO TABS
20.0000 mg | ORAL_TABLET | Freq: Every day | ORAL | 2 refills | Status: DC
Start: 2016-03-21 — End: 2016-08-03

## 2016-03-21 MED ORDER — CYANOCOBALAMIN 1000 MCG/ML IJ SOLN
1000.0000 ug | Freq: Once | INTRAMUSCULAR | Status: AC
  Administered 2016-03-21: 1000 ug via INTRAMUSCULAR
  Filled 2016-03-21: qty 1

## 2016-03-21 MED ORDER — FILGRASTIM 300 MCG/0.5ML IJ SOSY
300.0000 ug | PREFILLED_SYRINGE | Freq: Once | INTRAMUSCULAR | Status: AC
  Administered 2016-03-21: 300 ug via SUBCUTANEOUS
  Filled 2016-03-21: qty 0.5

## 2016-03-21 NOTE — Progress Notes (Signed)
Pt here today for Neupogen and B12 injections. Pt states that she had a bad episode of itching on her left shoulder and back yesterday. I spoke with Kirby Crigler PA-C about itching and he advised Benadryl. I advised the pt of this and also to start taking Folic acid 0000000 daily OTC, pt verbalized understanding. Pt given B12 injection in right deltoid, and Neupogen was given in right lower abdomen. Pt tolerated well. Pt stable and discharged home ambulatory. Pt to return as scheduled.

## 2016-03-21 NOTE — Patient Instructions (Signed)
[place] at [facility] Discharge Instructions  RECOMMENDATIONS MADE BY THE CONSULTANT AND ANY TEST RESULTS WILL BE SENT TO YOUR REFERRING PHYSICIAN.  You were given a Neupogen and B12 injection today.  Start taking the folic acid 0000000 daily per Dr. Whitney Muse.  Take Benadryl for itching per Kirby Crigler PA-C.   Thank you for choosing [place] at [facility] to provide your oncology and hematology care.  To afford each patient quality time with our provider, please arrive at least 15 minutes before your scheduled appointment time.   Beginning January 23rd 2017 lab work for the [facility] will be done in the  Main lab at [facility] on 1st floor. If you have a lab appointment with the [place] please come in thru the  Main Entrance and check in at the main information desk  You need to re-schedule your appointment should you arrive 10 or more minutes late.  We strive to give you quality time with our providers, and arriving late affects you and other patients whose appointments are after yours.  Also, if you no show three or more times for appointments you may be dismissed from the clinic at the providers discretion.     Again, thank you for choosing [facility].  Our hope is that these requests will decrease the amount of time that you wait before being seen by our physicians.       _____________________________________________________________  Should you have questions after your visit to [facility], please contact our office at [phone] between the hours of 8:30 a.m. and 4:30 p.m.  Voicemails left after 4:30 p.m. will not be returned until the following business day.  For prescription refill requests, have your pharmacy contact our office.         Resources For Cancer Patients and their Caregivers ? American Cancer Society: Can assist with transportation, wigs, general needs, runs Look Good Feel  Better.        229-290-5697 ? Cancer Care: Provides financial assistance, online support groups, medication/co-pay assistance.  1-800-813-HOPE 4634874651) ? [place] Assists [place] cancer patients and their families through emotional , educational and financial support.  7025542533 ? [place] DSS Where to apply for food stamps, Medicaid and utility assistance. 479-453-4884 ? RCATS: Transportation to medical appointments. 703-176-6065 ? Social Security Administration: May apply for disability if have a Stage IV cancer. [phone] 717-021-6380 ? [employer], Disability and Transit Services: Assists with nutrition, care and transit needs. [place] Support Programs: @10RELATIVEDAYS @ > Cancer Support Group  2nd Tuesday of the month 1pm-2pm, Journey Room  > Creative Journey  3rd Tuesday of the month 1130am-1pm, Journey Room  > Look Good Feel Better  1st Wednesday of the month 10am-12 noon, Journey Room (Call [place] to register [phone])

## 2016-03-22 ENCOUNTER — Encounter (HOSPITAL_COMMUNITY): Payer: Self-pay

## 2016-03-22 ENCOUNTER — Encounter (HOSPITAL_BASED_OUTPATIENT_CLINIC_OR_DEPARTMENT_OTHER): Payer: Medicare Other

## 2016-03-22 VITALS — BP 130/57 | HR 92 | Temp 98.7°F | Resp 18

## 2016-03-22 DIAGNOSIS — D701 Agranulocytosis secondary to cancer chemotherapy: Secondary | ICD-10-CM | POA: Diagnosis present

## 2016-03-22 DIAGNOSIS — C50912 Malignant neoplasm of unspecified site of left female breast: Secondary | ICD-10-CM | POA: Diagnosis not present

## 2016-03-22 DIAGNOSIS — C50919 Malignant neoplasm of unspecified site of unspecified female breast: Secondary | ICD-10-CM

## 2016-03-22 DIAGNOSIS — D702 Other drug-induced agranulocytosis: Secondary | ICD-10-CM

## 2016-03-22 MED ORDER — FILGRASTIM 300 MCG/ML IJ SOLN
300.0000 ug | Freq: Once | INTRAMUSCULAR | Status: DC
Start: 2016-03-22 — End: 2016-03-22
  Filled 2016-03-22: qty 1

## 2016-03-22 MED ORDER — FILGRASTIM 300 MCG/0.5ML IJ SOSY: 300.0000 ug | PREFILLED_SYRINGE | Freq: Once | INTRAMUSCULAR | Status: DC

## 2016-03-22 MED ORDER — FILGRASTIM 300 MCG/0.5ML IJ SOSY
300.0000 ug | PREFILLED_SYRINGE | Freq: Once | INTRAMUSCULAR | Status: AC
  Administered 2016-03-22: 300 ug via SUBCUTANEOUS
  Filled 2016-03-22: qty 0.5

## 2016-03-22 NOTE — Patient Instructions (Signed)
[place] at [facility] Discharge Instructions  RECOMMENDATIONS MADE BY THE CONSULTANT AND ANY TEST RESULTS WILL BE SENT TO YOUR REFERRING PHYSICIAN.  You were given a neupogen injection today. Return as scheduled.   Thank you for choosing [place] at [facility] to provide your oncology and hematology care.  To afford each patient quality time with our provider, please arrive at least 15 minutes before your scheduled appointment time.   Beginning January 23rd 2017 lab work for the [facility] will be done in the  Main lab at [facility] on 1st floor. If you have a lab appointment with the [place] please come in thru the  Main Entrance and check in at the main information desk  You need to re-schedule your appointment should you arrive 10 or more minutes late.  We strive to give you quality time with our providers, and arriving late affects you and other patients whose appointments are after yours.  Also, if you no show three or more times for appointments you may be dismissed from the clinic at the providers discretion.     Again, thank you for choosing [facility].  Our hope is that these requests will decrease the amount of time that you wait before being seen by our physicians.       _____________________________________________________________  Should you have questions after your visit to [facility], please contact our office at [phone] between the hours of 8:30 a.m. and 4:30 p.m.  Voicemails left after 4:30 p.m. will not be returned until the following business day.  For prescription refill requests, have your pharmacy contact our office.         Resources For Cancer Patients and their Caregivers ? American Cancer Society: Can assist with transportation, wigs, general needs, runs Look Good Feel Better.        815-580-0229 ? Cancer Care: Provides financial assistance, online support  groups, medication/co-pay assistance.  1-800-813-HOPE 902-794-4814) ? [place] Assists [place] cancer patients and their families through emotional , educational and financial support.  847-079-2508 ? [place] DSS Where to apply for food stamps, Medicaid and utility assistance. 670-781-8497 ? RCATS: Transportation to medical appointments. 458-186-2665 ? Social Security Administration: May apply for disability if have a Stage IV cancer. 938-401-2639 762-716-3899 ? [employer], Disability and Transit Services: Assists with nutrition, care and transit needs. [place] Support Programs: @10RELATIVEDAYS @ > Cancer Support Group  2nd Tuesday of the month 1pm-2pm, Journey Room  > Creative Journey  3rd Tuesday of the month 1130am-1pm, Journey Room  > Look Good Feel Better  1st Wednesday of the month 10am-12 noon, Journey Room (Call [place] to register [phone])

## 2016-03-22 NOTE — Progress Notes (Signed)
Pt here today for Neupogen injection. Pt given injection in left lower abdomen. Pt tolerated well. Pt stable and discharged home ambulatory. Pt to return as scheduled.   

## 2016-03-28 ENCOUNTER — Encounter (HOSPITAL_BASED_OUTPATIENT_CLINIC_OR_DEPARTMENT_OTHER): Payer: Medicare Other

## 2016-03-28 ENCOUNTER — Encounter (HOSPITAL_COMMUNITY): Payer: Self-pay

## 2016-03-28 VITALS — BP 83/48 | HR 103 | Temp 98.1°F | Resp 16

## 2016-03-28 DIAGNOSIS — C50919 Malignant neoplasm of unspecified site of unspecified female breast: Secondary | ICD-10-CM

## 2016-03-28 DIAGNOSIS — C7951 Secondary malignant neoplasm of bone: Secondary | ICD-10-CM

## 2016-03-28 DIAGNOSIS — E538 Deficiency of other specified B group vitamins: Secondary | ICD-10-CM | POA: Diagnosis present

## 2016-03-28 MED ORDER — CYANOCOBALAMIN 1000 MCG/ML IJ SOLN
1000.0000 ug | Freq: Once | INTRAMUSCULAR | Status: AC
  Administered 2016-03-28: 1000 ug via INTRAMUSCULAR

## 2016-03-28 MED ORDER — CYANOCOBALAMIN 1000 MCG/ML IJ SOLN
INTRAMUSCULAR | Status: AC
  Filled 2016-03-28: qty 1

## 2016-03-28 NOTE — Progress Notes (Signed)
Pt here today for B12 injection. Pt given injection in her right deltoid. Pt tolerated injection well. Pt stable and discharged home ambulatory. Pt to return as scheduled.

## 2016-03-28 NOTE — Patient Instructions (Signed)
[facility] at [facility] Discharge Instructions  RECOMMENDATIONS MADE BY THE CONSULTANT AND ANY TEST RESULTS WILL BE SENT TO YOUR REFERRING PHYSICIAN.   You were given a B12 injection today. Return as scheduled.   Thank you for choosing [facility] at [facility] to provide your oncology and hematology care.  To afford each patient quality time with our provider, please arrive at least 15 minutes before your scheduled appointment time.   Beginning January 23rd 2017 lab work for the Cancer Center will be done in the  Main lab at [place] on 1st floor. If you have a lab appointment with the Cancer Center please come in thru the  Main Entrance and check in at the main information desk  You need to re-schedule your appointment should you arrive 10 or more minutes late.  We strive to give you quality time with our providers, and arriving late affects you and other patients whose appointments are after yours.  Also, if you no show three or more times for appointments you may be dismissed from the clinic at the providers discretion.     Again, thank you for choosing [facility].  Our hope is that these requests will decrease the amount of time that you wait before being seen by our physicians.       _____________________________________________________________  Should you have questions after your visit to [facility], please contact our office at [phone] between the hours of 8:30 a.m. and 4:30 p.m.  Voicemails left after 4:30 p.m. will not be returned until the following business day.  For prescription refill requests, have your pharmacy contact our office.         Resources For Cancer Patients and their Caregivers ? American Cancer Society: Can assist with transportation, wigs, general needs, runs Look Good Feel Better.        1-888-227-6333 ? Cancer Care: Provides financial assistance, online support groups,  medication/co-pay assistance.  1-800-813-HOPE (4673) ? [facility] Assists [place] cancer patients and their families through emotional , educational and financial support.  336-427-4357 ? [place] DSS Where to apply for food stamps, Medicaid and utility assistance. 336-342-1394 ? RCATS: Transportation to medical appointments. 336-347-2287 ? Social Security Administration: May apply for disability if have a Stage IV cancer. 336-342-7796 1-800-772-1213 ? [place] Aging, Disability and Transit Services: Assists with nutrition, care and transit needs. 336-349-2343  Cancer Center Support Programs: @10RELATIVEDAYS@ > Cancer Support Group  2nd Tuesday of the month 1pm-2pm, Journey Room  > Creative Journey  3rd Tuesday of the month 1130am-1pm, Journey Room  > Look Good Feel Better  1st Wednesday of the month 10am-12 noon, Journey Room (Call American Cancer Society to register 1-800-395-5775)   

## 2016-03-29 ENCOUNTER — Encounter (HOSPITAL_COMMUNITY): Payer: Self-pay | Admitting: Oncology

## 2016-03-29 ENCOUNTER — Encounter (HOSPITAL_BASED_OUTPATIENT_CLINIC_OR_DEPARTMENT_OTHER): Payer: Medicare Other | Admitting: Oncology

## 2016-03-29 VITALS — BP 122/64 | HR 97 | Temp 98.0°F | Resp 16

## 2016-03-29 DIAGNOSIS — Z170 Estrogen receptor positive status [ER+]: Secondary | ICD-10-CM | POA: Diagnosis not present

## 2016-03-29 DIAGNOSIS — D6481 Anemia due to antineoplastic chemotherapy: Secondary | ICD-10-CM

## 2016-03-29 DIAGNOSIS — C50912 Malignant neoplasm of unspecified site of left female breast: Secondary | ICD-10-CM

## 2016-03-29 DIAGNOSIS — E538 Deficiency of other specified B group vitamins: Secondary | ICD-10-CM | POA: Diagnosis not present

## 2016-03-29 DIAGNOSIS — Z23 Encounter for immunization: Secondary | ICD-10-CM | POA: Diagnosis not present

## 2016-03-29 DIAGNOSIS — C7951 Secondary malignant neoplasm of bone: Secondary | ICD-10-CM

## 2016-03-29 DIAGNOSIS — C50919 Malignant neoplasm of unspecified site of unspecified female breast: Secondary | ICD-10-CM

## 2016-03-29 DIAGNOSIS — D6959 Other secondary thrombocytopenia: Secondary | ICD-10-CM | POA: Diagnosis not present

## 2016-03-29 MED ORDER — INFLUENZA VAC SPLIT QUAD 0.5 ML IM SUSY
0.5000 mL | PREFILLED_SYRINGE | Freq: Once | INTRAMUSCULAR | Status: AC
  Administered 2016-03-29: 0.5 mL via INTRAMUSCULAR

## 2016-03-29 MED ORDER — INFLUENZA VAC SPLIT QUAD 0.5 ML IM SUSY
PREFILLED_SYRINGE | INTRAMUSCULAR | Status: AC
  Filled 2016-03-29: qty 0.5

## 2016-03-29 NOTE — Progress Notes (Signed)
Debbie Reyes presents today for injection per MD orders. Flu vaccination administered IM in right Upper Arm. Administration without incident. Patient tolerated well.

## 2016-03-29 NOTE — Progress Notes (Signed)
Debbie Lair, MD [place] [address]  Metastatic breast cancer [facility]) - Plan: Cancer antigen 27.29, Cancer antigen 15-3, Cancer antigen 27.29, Cancer antigen 15-3, Influenza vac split quadrivalent PF (FLUARIX) injection 0.5 mL  Bone metastases (HCC) - Plan: Cancer antigen 27.29, Cancer antigen 15-3, Cancer antigen 27.29, Cancer antigen 15-3, Influenza vac split quadrivalent PF (FLUARIX) injection 0.5 mL  Low serum vitamin B12 - Plan: Cancer antigen 27.29, Cancer antigen 15-3, Cancer antigen 27.29, Cancer antigen 15-3, Influenza vac split quadrivalent PF (FLUARIX) injection 0.5 mL  CURRENT THERAPY: Halaven days 1, 8 every 21 days (with Neupogen support) and Xgeva monthly.  INTERVAL HISTORY: Debbie Reyes 75 y.o. 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  Complicated by chemotherapy induced anemia and neutropenia.  On Xgeva for bone metastases.  She is tolerating therapy well without any treatment related side effects. She denies any new areas of concern. She denies any new pain.  We did discuss treatment options moving forward regarding B12 replacement.  Her weight is down a few pounds compared to 2 weeks ago.  Review of Systems  Constitutional: Positive for weight loss. Negative for chills and fever.  HENT: Negative.   Eyes: Negative.  Negative for blurred vision and double vision.  Respiratory: Negative.  Negative for cough.   Cardiovascular: Negative.  Negative for chest pain.  Gastrointestinal: Negative.  Negative for nausea and vomiting.  Genitourinary: Negative.   Musculoskeletal: Negative.   Skin: Negative.  Negative for rash.  Neurological: Negative.  Negative for weakness.  Endo/Heme/Allergies: Negative.   Psychiatric/Behavioral: Negative.     Past Medical History:  Diagnosis Date  . Bone metastases ([place])  12/29/2015  . Cancer ([place])   . Low serum vitamin B12 03/18/2016  . Metastatic breast cancer ([place]) 12/29/2015    No past surgical history on file.  No family history on file.  Social History   Social History  . Marital status: Widowed    Spouse name: N/A  . Number of children: N/A  . Years of education: N/A   Social History Main Topics  . Smoking status: Never Smoker  . Smokeless tobacco: Never Used  . Alcohol use No  . Drug use: No  . Sexual activity: No   Other Topics Concern  . Not on file   Social History Narrative  . No narrative on file     PHYSICAL EXAMINATION  ECOG PERFORMANCE STATUS: 1 - Symptomatic but completely ambulatory  Vitals:   03/29/16 0913  BP: 122/64  Pulse: 97  Resp: 16  Temp: 98 F (36.7 C)    GENERAL:alert, no distress, well developed, cachectic, comfortable, cooperative, smiling and unaccompanied SKIN: skin color, texture, turgor are normal, no rashes or significant lesions HEAD: Normocephalic, No masses, lesions, tenderness or abnormalities EYES: normal, EOMI, Conjunctiva are pink and non-injected EARS: External ears normal OROPHARYNX:lips, buccal mucosa, and tongue normal and mucous membranes are moist  NECK: supple, no adenopathy, trachea midline LYMPH:  no palpable lymphadenopathy BREAST:not examined LUNGS: clear to auscultation and percussion HEART: regular rate & rhythm, no murmurs and no gallops ABDOMEN:abdomen soft, non-tender and normal bowel sounds BACK: Back symmetric, no curvature. EXTREMITIES:less then 2 second capillary refill, no joint deformities, effusion, or inflammation, no skin discoloration, no cyanosis  NEURO: alert & oriented x 3 with fluent speech, no focal motor/sensory deficits, gait normal   LABORATORY  DATA: CBC    Component Value Date/Time   WBC 5.2 03/18/2016 1342   RBC 2.39 (L) 03/18/2016 1342   HGB 8.5 (L) 03/18/2016 1342   HCT 25.8 (L) 03/18/2016 1342   PLT 156 03/18/2016 1342   MCV 107.9 (H)  03/18/2016 1342   MCH 35.6 (H) 03/18/2016 1342   MCHC 32.9 03/18/2016 1342   RDW 18.2 (H) 03/18/2016 1342   LYMPHSABS 1.0 03/18/2016 1342   MONOABS 2.4 (H) 03/18/2016 1342   EOSABS 0.0 03/18/2016 1342   BASOSABS 0.1 03/18/2016 1342      Chemistry      Component Value Date/Time   NA 130 (L) 03/18/2016 1342   K 3.8 03/18/2016 1342   CL 99 (L) 03/18/2016 1342   CO2 25 03/18/2016 1342   BUN 12 03/18/2016 1342   CREATININE 0.97 03/18/2016 1342      Component Value Date/Time   CALCIUM 8.7 (L) 03/18/2016 1342   ALKPHOS 59 03/18/2016 1342   AST 31 03/18/2016 1342   ALT 10 (L) 03/18/2016 1342   BILITOT 0.9 03/18/2016 1342        PENDING LABS:   RADIOGRAPHIC STUDIES:  Nm Pet Image Restag (ps) Skull Base To Thigh  Result Date: 03/01/2016 CLINICAL DATA:  Subsequent treatment strategy for breast cancer. EXAM: NUCLEAR MEDICINE PET SKULL BASE TO THIGH TECHNIQUE: 5.3 mCi F-18 FDG was injected intravenously. Full-ring PET imaging was performed from the skull base to thigh after the radiotracer. CT data was obtained and used for attenuation correction and anatomic localization. FASTING BLOOD GLUCOSE:  Value: 106 mg/dl COMPARISON:  12/15/2015. FINDINGS: NECK Increased uptake in the left sternocleidomastoid muscle, likely related to patient motion. CHEST The nodular density seen previously in the anterior left upper lobe (image 35 series 8 today) measures 6 mm today with SUV max = 6.3 compared to 6.0 previously. The bandlike linear atelectasis scarring in the anterior left upper lobe is stable without substantial hypermetabolism. Chronic left pleural effusion with pleural thickening again noted. There is activity in the posterior aspect of the collapse/ consolidation at the left lung base, decreased slightly in the interval. Small right pleural effusion is stable. Right Port-A-Cath tip projects at the SVC/RA junction. ABDOMEN/PELVIS Interval development of increasing and new hypermetabolic  metastases in the liver. 17 mm hypermetabolic lesion in the tip of the lateral segment left liver was 5 mm previously. SUV max = 8.0 on today's exam. 6 mm hypermetabolic lesions seen in the medial right liver on the previous study has progressed 17 mm today (image 91 series 4) with SUV max = 6.0. 13 mm hypodense lesion seen previously in the inferior right hepatic lobe is stable. As before, this lesion is not substantially hypermetabolic. There is abdominal aortic atherosclerosis without aneurysm. SKELETON Multiple hypermetabolic bony metastases are evident. Sternal lesion measured previously has SUV max = 8.1 today compared to 6.4 previously. Mid thoracic vertebral body lesion with SUV max = 7.4 today compared to 5.3 previously. Index lesion in the right iliac crest is 14.5 today compared to 10.3 previously. IMPRESSION: 1. Interval overall progression of disease. 2. Interval development of new and progression of pre-existing lesions in the liver, hypermetabolic on PET imaging, consistent with metastatic disease. 3. Persistent hypermetabolic patchy opacity in the anterior left upper lobe with persistent chronic left pleural effusion. 4. Diffuse bony metastases again noted with interval progression of FDG uptake associated with the index bone lesions. Electronically Signed   By: Misty Stanley M.D.   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  Complicated by chemotherapy induced anemia and neutropenia.  On Xgeva for bone metastases.  Oncology history is updated.  Pre-treatment labs as scheduled: CBC diff, CMET, CA 27.29, and CA 15-3.  I personally reviewed and went over laboratory results with the patient.  The results are noted within this dictation.   She is tolerating treatment well.  She denies any treatment related  complaints.  She will get a Flu shot today.  Next Xgeva injection is due in ~ 2 weeks  Return in 3 weeks for follow-up with continued treatment as planned.   Low serum vitamin B12 Low-normal B12 level with anemia and macrocytosis.  On B12 replacement therapy IM.  Supportive therapy plan is reviewed.  Following weekly IM replacement x 4, we can try SL B12.  She will discuss treatment options moving forward with her daughter who is an Therapist, sports at [employer].  She, at this time, favors SL replacement which can be addressed at next follow-up visit.   ORDERS PLACED FOR THIS ENCOUNTER: Orders Placed This Encounter  Procedures  . Cancer antigen 27.29  . Cancer antigen 15-3  . Cancer antigen 27.29  . Cancer antigen 15-3    MEDICATIONS PRESCRIBED THIS ENCOUNTER: Meds ordered this encounter  Medications  . Influenza vac split quadrivalent PF (FLUARIX) injection 0.5 mL    THERAPY PLAN:  Continue Halaven, Xgeva, and B12 replacement therapy as outlined above and scheduled.  All questions were answered. The patient knows to call the clinic with any problems, questions or concerns. We can certainly see the patient much sooner if necessary.  Patient and plan discussed with Dr. Ancil Linsey and she is in agreement with the aforementioned.   This note is electronically signed by: Doy Mince 03/29/2016 9:45 AM

## 2016-03-29 NOTE — Assessment & Plan Note (Addendum)
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  Complicated by chemotherapy induced anemia and neutropenia.  On Xgeva for bone metastases.  Oncology history is updated.  Pre-treatment labs as scheduled: CBC diff, CMET, CA 27.29, and CA 15-3.  I personally reviewed and went over laboratory results with the patient.  The results are noted within this dictation.   She is tolerating treatment well.  She denies any treatment related complaints.  She will get a Flu shot today.  Next Xgeva injection is due in ~ 2 weeks  Return in 3 weeks for follow-up with continued treatment as planned.

## 2016-03-29 NOTE — Assessment & Plan Note (Addendum)
Low-normal B12 level with anemia and macrocytosis.  On B12 replacement therapy IM.  Supportive therapy plan is reviewed.  Following weekly IM replacement x 4, we can try SL B12.  She will discuss treatment options moving forward with her daughter who is an Therapist, sports at [employer].  She, at this time, favors SL replacement which can be addressed at next follow-up visit.

## 2016-03-29 NOTE — Patient Instructions (Addendum)
[place] at [facility] Discharge Instructions  RECOMMENDATIONS MADE BY THE CONSULTANT AND ANY TEST RESULTS WILL BE SENT TO YOUR REFERRING PHYSICIAN.  You will return for follow up in three weeks. B12 injection next week as scheduled, as well as Xgeva as scheduled. You were given the flu vaccination today.    Thank you for choosing [place] at [facility] to provide your oncology and hematology care.  To afford each patient quality time with our provider, please arrive at least 15 minutes before your scheduled appointment time.   Beginning January 23rd 2017 lab work for the [facility] will be done in the  Main lab at [facility] on 1st floor. If you have a lab appointment with the [facility] please come in thru the  Main Entrance and check in at the main information desk  You need to re-schedule your appointment should you arrive 10 or more minutes late.  We strive to give you quality time with our providers, and arriving late affects you and other patients whose appointments are after yours.  Also, if you no show three or more times for appointments you may be dismissed from the clinic at the providers discretion.     Again, thank you for choosing [facility].  Our hope is that these requests will decrease the amount of time that you wait before being seen by our physicians.       _____________________________________________________________  Should you have questions after your visit to [facility], please contact our office at [phone] between the hours of 8:30 a.m. and 4:30 p.m.  Voicemails left after 4:30 p.m. will not be returned until the following business day.  For prescription refill requests, have your pharmacy contact our office.         Resources For Cancer Patients and their Caregivers ? American Cancer Society: Can assist with transportation, wigs, general needs, runs Look Good Feel  Better.        575-405-9106 ? Cancer Care: Provides financial assistance, online support groups, medication/co-pay assistance.  1-800-813-HOPE (615)159-4274) ? [place] Assists [place] cancer patients and their families through emotional , educational and financial support.  458-800-2282 ? [place] DSS Where to apply for food stamps, Medicaid and utility assistance. 236-085-9721 ? RCATS: Transportation to medical appointments. 702-715-6311 ? Social Security Administration: May apply for disability if have a Stage IV cancer. [phone] [phone] ? [employer], Disability and Transit Services: Assists with nutrition, care and transit needs. [place] Support Programs: @10RELATIVEDAYS @ > Cancer Support Group  2nd Tuesday of the month 1pm-2pm, Journey Room  > Creative Journey  3rd Tuesday of the month 1130am-1pm, Journey Room  > Look Good Feel Better  1st Wednesday of the month 10am-12 noon, Journey Room (Call [place] to register [phone])

## 2016-04-01 ENCOUNTER — Encounter (HOSPITAL_BASED_OUTPATIENT_CLINIC_OR_DEPARTMENT_OTHER): Payer: Medicare Other

## 2016-04-01 ENCOUNTER — Other Ambulatory Visit (HOSPITAL_COMMUNITY): Payer: Medicare Other

## 2016-04-01 ENCOUNTER — Ambulatory Visit (HOSPITAL_COMMUNITY): Payer: Medicare Other

## 2016-04-01 VITALS — BP 100/56 | HR 92 | Temp 98.0°F | Resp 18 | Wt 102.6 lb

## 2016-04-01 DIAGNOSIS — E538 Deficiency of other specified B group vitamins: Secondary | ICD-10-CM

## 2016-04-01 DIAGNOSIS — C50612 Malignant neoplasm of axillary tail of left female breast: Secondary | ICD-10-CM | POA: Diagnosis not present

## 2016-04-01 DIAGNOSIS — C50919 Malignant neoplasm of unspecified site of unspecified female breast: Secondary | ICD-10-CM

## 2016-04-01 DIAGNOSIS — C799 Secondary malignant neoplasm of unspecified site: Secondary | ICD-10-CM | POA: Diagnosis not present

## 2016-04-01 DIAGNOSIS — Z5111 Encounter for antineoplastic chemotherapy: Secondary | ICD-10-CM

## 2016-04-01 DIAGNOSIS — C7951 Secondary malignant neoplasm of bone: Secondary | ICD-10-CM | POA: Diagnosis not present

## 2016-04-01 DIAGNOSIS — D702 Other drug-induced agranulocytosis: Secondary | ICD-10-CM

## 2016-04-01 LAB — CBC WITH DIFFERENTIAL/PLATELET
BASOS ABS: 0.1 10*3/uL (ref 0.0–0.1)
Basophils Relative: 1 %
EOS ABS: 0 10*3/uL (ref 0.0–0.7)
Eosinophils Relative: 0 %
HEMATOCRIT: 25.4 % — AB (ref 36.0–46.0)
Hemoglobin: 8.1 g/dL — ABNORMAL LOW (ref 12.0–15.0)
LYMPHS ABS: 1.2 10*3/uL (ref 0.7–4.0)
LYMPHS PCT: 12 %
MCH: 34.5 pg — ABNORMAL HIGH (ref 26.0–34.0)
MCHC: 31.9 g/dL (ref 30.0–36.0)
MCV: 108.1 fL — AB (ref 78.0–100.0)
MONOS PCT: 15 %
Monocytes Absolute: 1.5 10*3/uL — ABNORMAL HIGH (ref 0.1–1.0)
Neutro Abs: 7.2 10*3/uL (ref 1.7–7.7)
Neutrophils Relative %: 72 %
PLATELETS: 189 10*3/uL (ref 150–400)
RBC: 2.35 MIL/uL — AB (ref 3.87–5.11)
RDW: 18.6 % — AB (ref 11.5–15.5)
WBC: 10 10*3/uL (ref 4.0–10.5)

## 2016-04-01 LAB — COMPREHENSIVE METABOLIC PANEL
ALBUMIN: 3.7 g/dL (ref 3.5–5.0)
ALT: 11 U/L — AB (ref 14–54)
AST: 24 U/L (ref 15–41)
Alkaline Phosphatase: 56 U/L (ref 38–126)
Anion gap: 9 (ref 5–15)
BUN: 14 mg/dL (ref 6–20)
CHLORIDE: 101 mmol/L (ref 101–111)
CO2: 25 mmol/L (ref 22–32)
CREATININE: 0.87 mg/dL (ref 0.44–1.00)
Calcium: 9 mg/dL (ref 8.9–10.3)
GFR calc Af Amer: >60 mL/min (ref >60)
GLUCOSE: 99 mg/dL (ref 65–99)
POTASSIUM: 4 mmol/L (ref 3.5–5.1)
Sodium: 135 mmol/L (ref 135–145)
Total Bilirubin: 0.5 mg/dL (ref 0.3–1.2)
Total Protein: 6.6 g/dL (ref 6.5–8.1)

## 2016-04-01 MED ORDER — ONDANSETRON HCL 4 MG PO TABS
8.0000 mg | ORAL_TABLET | Freq: Once | ORAL | Status: AC
  Administered 2016-04-01: 8 mg via ORAL
  Filled 2016-04-01: qty 2

## 2016-04-01 MED ORDER — SODIUM CHLORIDE 0.9% FLUSH
10.0000 mL | INTRAVENOUS | Status: DC | PRN
  Administered 2016-04-01: 10 mL
  Filled 2016-04-01: qty 10

## 2016-04-01 MED ORDER — HEPARIN SOD (PORK) LOCK FLUSH 100 UNIT/ML IV SOLN
500.0000 [IU] | Freq: Once | INTRAVENOUS | Status: AC | PRN
  Administered 2016-04-01: 500 [IU]
  Filled 2016-04-01: qty 5

## 2016-04-01 MED ORDER — SODIUM CHLORIDE 0.9 % IV SOLN
1.4000 mg/m2 | Freq: Once | INTRAVENOUS | Status: AC
  Administered 2016-04-01: 2 mg via INTRAVENOUS
  Filled 2016-04-01: qty 4

## 2016-04-01 MED ORDER — PROCHLORPERAZINE MALEATE 10 MG PO TABS
10.0000 mg | ORAL_TABLET | Freq: Once | ORAL | Status: AC
  Administered 2016-04-01: 10 mg via ORAL
  Filled 2016-04-01: qty 1

## 2016-04-01 MED ORDER — SODIUM CHLORIDE 0.9 % IV SOLN
Freq: Once | INTRAVENOUS | Status: AC
  Administered 2016-04-01: 16:00:00 via INTRAVENOUS

## 2016-04-01 NOTE — Patient Instructions (Addendum)
[facility] Discharge Instructions for Patients Receiving Chemotherapy   Beginning January 23rd 2017 lab work for the [facility] will be done in the  Main lab at [facility] on 1st floor. If you have a lab appointment with the [facility] please come in thru the  Main Entrance and check in at the main information desk   Today you received the following chemotherapy agents Halaven. Return Monday 9/25 for Neupogen Follow-up as scheduled. Call clinic for any questions or concerns  To help prevent nausea and vomiting after your treatment, we encourage you to take your nausea medication   If you develop nausea and vomiting, or diarrhea that is not controlled by your medication, call the clinic.  The clinic phone number is [phone]. Office hours are Monday-Friday 8:30am-5:00pm.  BELOW ARE SYMPTOMS THAT SHOULD BE REPORTED IMMEDIATELY:  *FEVER GREATER THAN 101.0 F  *CHILLS WITH OR WITHOUT FEVER  NAUSEA AND VOMITING THAT IS NOT CONTROLLED WITH YOUR NAUSEA MEDICATION  *UNUSUAL SHORTNESS OF BREATH  *UNUSUAL BRUISING OR BLEEDING  TENDERNESS IN MOUTH AND THROAT WITH OR WITHOUT PRESENCE OF ULCERS  *URINARY PROBLEMS  *BOWEL PROBLEMS  UNUSUAL RASH Items with * indicate a potential emergency and should be followed up as soon as possible. If you have an emergency after office hours please contact your primary care physician or go to the nearest emergency department.  Please call the clinic during office hours if you have any questions or concerns.   You may also contact the Patient Navigator at [phone] should you have any questions or need assistance in obtaining follow up care.      Resources For Cancer Patients and their Caregivers ? American Cancer Society: Can assist with transportation, wigs, general needs, runs Look Good Feel Better.        720-257-0008 ? Cancer Care: Provides financial assistance, online support groups, medication/co-pay  assistance.  1-800-813-HOPE 249-411-2598) ? [place] Assists [place] cancer patients and their families through emotional , educational and financial support.  618-289-0958 ? [place] DSS Where to apply for food stamps, Medicaid and utility assistance. 516-255-6181 ? RCATS: Transportation to medical appointments. 418-459-6228 ? Social Security Administration: May apply for disability if have a Stage IV cancer. 2043765539 [phone] ? [employer], Disability and Transit Services: Assists with nutrition, care and transit needs. 7163110765

## 2016-04-01 NOTE — Progress Notes (Signed)
Debbie Reyes tolerated chemo tx well without complaints or incident. Lab results reviewed with MD prior to administering chemo.VSS upon discharge. Pt discharged self ambulatory in satisfactory condition

## 2016-04-02 LAB — CANCER ANTIGEN 15-3: CA 15-3: 515.3 U/mL — ABNORMAL HIGH (ref 0.0–25.0)

## 2016-04-02 LAB — CANCER ANTIGEN 27.29: CA 27.29: 655 U/mL — AB (ref 0.0–38.6)

## 2016-04-04 ENCOUNTER — Telehealth (HOSPITAL_COMMUNITY): Payer: Self-pay | Admitting: *Deleted

## 2016-04-04 ENCOUNTER — Encounter (HOSPITAL_COMMUNITY): Payer: Self-pay

## 2016-04-04 ENCOUNTER — Encounter (HOSPITAL_COMMUNITY): Payer: Medicare Other

## 2016-04-04 ENCOUNTER — Encounter (HOSPITAL_BASED_OUTPATIENT_CLINIC_OR_DEPARTMENT_OTHER): Payer: Medicare Other

## 2016-04-04 VITALS — BP 141/69 | HR 96 | Temp 98.0°F | Resp 16

## 2016-04-04 DIAGNOSIS — C50919 Malignant neoplasm of unspecified site of unspecified female breast: Secondary | ICD-10-CM | POA: Diagnosis not present

## 2016-04-04 DIAGNOSIS — D702 Other drug-induced agranulocytosis: Secondary | ICD-10-CM

## 2016-04-04 DIAGNOSIS — C7951 Secondary malignant neoplasm of bone: Secondary | ICD-10-CM

## 2016-04-04 DIAGNOSIS — E538 Deficiency of other specified B group vitamins: Secondary | ICD-10-CM

## 2016-04-04 DIAGNOSIS — C799 Secondary malignant neoplasm of unspecified site: Secondary | ICD-10-CM | POA: Diagnosis not present

## 2016-04-04 DIAGNOSIS — Z452 Encounter for adjustment and management of vascular access device: Secondary | ICD-10-CM

## 2016-04-04 DIAGNOSIS — D701 Agranulocytosis secondary to cancer chemotherapy: Secondary | ICD-10-CM

## 2016-04-04 DIAGNOSIS — Z95828 Presence of other vascular implants and grafts: Secondary | ICD-10-CM

## 2016-04-04 LAB — CBC WITH DIFFERENTIAL/PLATELET
BASOS ABS: 0 10*3/uL (ref 0.0–0.1)
BASOS PCT: 2 %
EOS ABS: 0 10*3/uL (ref 0.0–0.7)
EOS PCT: 0 %
HCT: 23.8 % — ABNORMAL LOW (ref 36.0–46.0)
HEMOGLOBIN: 7.7 g/dL — AB (ref 12.0–15.0)
LYMPHS ABS: 0.6 10*3/uL — AB (ref 0.7–4.0)
Lymphocytes Relative: 25 %
MCH: 34.5 pg — AB (ref 26.0–34.0)
MCHC: 32.4 g/dL (ref 30.0–36.0)
MCV: 106.7 fL — ABNORMAL HIGH (ref 78.0–100.0)
Monocytes Absolute: 0.4 10*3/uL (ref 0.1–1.0)
Monocytes Relative: 17 %
NEUTROS PCT: 56 %
Neutro Abs: 1.2 10*3/uL — ABNORMAL LOW (ref 1.7–7.7)
PLATELETS: 162 10*3/uL (ref 150–400)
RBC: 2.23 MIL/uL — AB (ref 3.87–5.11)
RDW: 17.9 % — ABNORMAL HIGH (ref 11.5–15.5)
WBC: 2.2 10*3/uL — AB (ref 4.0–10.5)

## 2016-04-04 LAB — SAMPLE TO BLOOD BANK

## 2016-04-04 LAB — PREPARE RBC (CROSSMATCH)

## 2016-04-04 MED ORDER — CYANOCOBALAMIN 1000 MCG/ML IJ SOLN
1000.0000 ug | Freq: Once | INTRAMUSCULAR | Status: AC
Start: 2016-04-04 — End: 2016-04-04
  Administered 2016-04-04: 1000 ug via INTRAMUSCULAR
  Filled 2016-04-04: qty 1

## 2016-04-04 MED ORDER — FILGRASTIM 300 MCG/0.5ML IJ SOSY
300.0000 ug | PREFILLED_SYRINGE | Freq: Once | INTRAMUSCULAR | Status: AC
  Administered 2016-04-04: 300 ug via SUBCUTANEOUS
  Filled 2016-04-04: qty 0.5

## 2016-04-04 MED ORDER — HEPARIN SOD (PORK) LOCK FLUSH 100 UNIT/ML IV SOLN
INTRAVENOUS | Status: AC
  Filled 2016-04-04: qty 5

## 2016-04-04 MED ORDER — SODIUM CHLORIDE 0.9% FLUSH
10.0000 mL | INTRAVENOUS | Status: DC | PRN
  Administered 2016-04-04: 10 mL via INTRAVENOUS
  Filled 2016-04-04: qty 10

## 2016-04-04 MED ORDER — HEPARIN SOD (PORK) LOCK FLUSH 100 UNIT/ML IV SOLN
500.0000 [IU] | Freq: Once | INTRAVENOUS | Status: AC
  Administered 2016-04-04: 500 [IU] via INTRAVENOUS

## 2016-04-04 NOTE — Patient Instructions (Signed)
[place] at [facility] Discharge Instructions  RECOMMENDATIONS MADE BY THE CONSULTANT AND ANY TEST RESULTS WILL BE SENT TO YOUR REFERRING PHYSICIAN.  Neupagen, B12, and port flush with labs done today. Follow as scheduled.  Thank you for choosing [place] at [facility] to provide your oncology and hematology care.  To afford each patient quality time with our provider, please arrive at least 15 minutes before your scheduled appointment time.   Beginning January 23rd 2017 lab work for the [facility] will be done in the  Main lab at [facility] on 1st floor. If you have a lab appointment with the [facility] please come in thru the  Main Entrance and check in at the main information desk  You need to re-schedule your appointment should you arrive 10 or more minutes late.  We strive to give you quality time with our providers, and arriving late affects you and other patients whose appointments are after yours.  Also, if you no show three or more times for appointments you may be dismissed from the clinic at the providers discretion.     Again, thank you for choosing [facility].  Our hope is that these requests will decrease the amount of time that you wait before being seen by our physicians.       _____________________________________________________________  Should you have questions after your visit to [facility], please contact our office at [phone] between the hours of 8:30 a.m. and 4:30 p.m.  Voicemails left after 4:30 p.m. will not be returned until the following business day.  For prescription refill requests, have your pharmacy contact our office.         Resources For Cancer Patients and their Caregivers ? American Cancer Society: Can assist with transportation, wigs, general needs, runs Look Good Feel Better.        [phone] ? Cancer Care: Provides financial assistance, online  support groups, medication/co-pay assistance.  1-800-813-HOPE 903-302-9170) ? [place] Assists [place] cancer patients and their families through emotional , educational and financial support.  450-761-2420 ? [place] DSS Where to apply for food stamps, Medicaid and utility assistance. [phone] ? RCATS: Transportation to medical appointments. 6575074308 ? Social Security Administration: May apply for disability if have a Stage IV cancer. [phone] [phone])[phone] ? [employer], Disability and Transit Services: Assists with nutrition, care and transit needs. [place] Support Programs: @10RELATIVEDAYS @ > Cancer Support Group  2nd Tuesday of the month 1pm-2pm, Journey Room  > Creative Journey  3rd Tuesday of the month 1130am-1pm, Journey Room  > Look Good Feel Better  1st Wednesday of the month 10am-12 noon, Journey Room (Call [place] to register [phone])

## 2016-04-04 NOTE — Telephone Encounter (Signed)
Pt aware that labs are better.

## 2016-04-04 NOTE — Telephone Encounter (Signed)
-----   Message from [facility], PA-C sent at 04/02/2016 10:25 PM EDT ----- Stable/improved.

## 2016-04-04 NOTE — Progress Notes (Signed)
Fleeta Emmer presented for Portacath access and flush. Portacath located right chest wall accessed with  H 20 needle. Good blood return present. Portacath flushed with 29ml NS and 500U/89ml Heparin and needle removed intact. Procedure without incident. Patient tolerated procedure well.      Labs done too.   MIGUELINA FRATANGELO presents today for injection per MD orders. Neupogen 33mcg administered SQ in right Abdomen. Administration without incident. Patient tolerated well.  KAIRO EARHART presents today for injection per MD orders. B12 1,040mcg administered IM in right Upper Arm. Administration without incident. Patient tolerated well.  After giving the patient her injections, during her venipuncture, patient stated she felt hot, lightheaded. I withdrew the needle and checked her vitals, gave her a coke, and crackers. Patient stated she had not eaten much today. See flowsheet for vitals. MD made aware of situation and instructed me to watch her and recheck her vitals in 15 min. Patient BP returned to normal and I drew her labs. Vitals stable at discharge, patient stated she felt better and she would go home and eat. Discharged patient amublatory self.

## 2016-04-05 ENCOUNTER — Encounter (HOSPITAL_BASED_OUTPATIENT_CLINIC_OR_DEPARTMENT_OTHER): Payer: Medicare Other

## 2016-04-05 ENCOUNTER — Encounter (HOSPITAL_COMMUNITY): Payer: Self-pay

## 2016-04-05 VITALS — BP 101/85 | HR 89 | Temp 98.2°F | Resp 18

## 2016-04-05 DIAGNOSIS — D702 Other drug-induced agranulocytosis: Secondary | ICD-10-CM

## 2016-04-05 DIAGNOSIS — C50919 Malignant neoplasm of unspecified site of unspecified female breast: Secondary | ICD-10-CM

## 2016-04-05 DIAGNOSIS — C50912 Malignant neoplasm of unspecified site of left female breast: Secondary | ICD-10-CM

## 2016-04-05 DIAGNOSIS — D701 Agranulocytosis secondary to cancer chemotherapy: Secondary | ICD-10-CM

## 2016-04-05 LAB — ABO/RH: ABO/RH(D): A NEG

## 2016-04-05 MED ORDER — FILGRASTIM 300 MCG/0.5ML IJ SOSY
300.0000 ug | PREFILLED_SYRINGE | Freq: Once | INTRAMUSCULAR | Status: AC
  Administered 2016-04-05: 300 ug via SUBCUTANEOUS
  Filled 2016-04-05: qty 0.5

## 2016-04-05 NOTE — Progress Notes (Signed)
FLORENCIA EBSEN presents today for injection per MD orders. Neupogen 321mcg administered SQ in left Abdomen. Administration without incident. Patient tolerated well.

## 2016-04-05 NOTE — Patient Instructions (Signed)
[place] at [facility] Discharge Instructions  RECOMMENDATIONS MADE BY THE CONSULTANT AND ANY TEST RESULTS WILL BE SENT TO YOUR REFERRING PHYSICIAN.  Neupagen 375mcg given today. Follow up as scheduled.  Thank you for choosing [place] at [facility] to provide your oncology and hematology care.  To afford each patient quality time with our provider, please arrive at least 15 minutes before your scheduled appointment time.   Beginning January 23rd 2017 lab work for the [facility] will be done in the  Main lab at [facility] on 1st floor. If you have a lab appointment with the [facility] please come in thru the  Main Entrance and check in at the main information desk  You need to re-schedule your appointment should you arrive 10 or more minutes late.  We strive to give you quality time with our providers, and arriving late affects you and other patients whose appointments are after yours.  Also, if you no show three or more times for appointments you may be dismissed from the clinic at the providers discretion.     Again, thank you for choosing [facility].  Our hope is that these requests will decrease the amount of time that you wait before being seen by our physicians.       _____________________________________________________________  Should you have questions after your visit to [facility], please contact our office at [phone] between the hours of 8:30 a.m. and 4:30 p.m.  Voicemails left after 4:30 p.m. will not be returned until the following business day.  For prescription refill requests, have your pharmacy contact our office.         Resources For Cancer Patients and their Caregivers ? American Cancer Society: Can assist with transportation, wigs, general needs, runs Look Good Feel Better.        ([phone] ? Cancer Care: Provides financial assistance, online support groups,  medication/co-pay assistance.  1-800-813-HOPE 323-858-3131) ? [place] Assists [place] cancer patients and their families through emotional , educational and financial support.  7240217284 ? [place] DSS Where to apply for food stamps, Medicaid and utility assistance. 920-630-9881 ? RCATS: Transportation to medical appointments. 3324424447 ? Social Security Administration: May apply for disability if have a Stage IV cancer. 954-387-4751 [phone] ? [employer], Disability and Transit Services: Assists with nutrition, care and transit needs. [place] Support Programs: @10RELATIVEDAYS @ > Cancer Support Group  2nd Tuesday of the month 1pm-2pm, Journey Room  > Creative Journey  3rd Tuesday of the month 1130am-1pm, Journey Room  > Look Good Feel Better  1st Wednesday of the month 10am-12 noon, Journey Room (Call [place] to register [phone])

## 2016-04-07 ENCOUNTER — Encounter (HOSPITAL_COMMUNITY): Payer: Self-pay

## 2016-04-07 ENCOUNTER — Encounter (HOSPITAL_BASED_OUTPATIENT_CLINIC_OR_DEPARTMENT_OTHER): Payer: Medicare Other

## 2016-04-07 VITALS — BP 132/52 | HR 82 | Temp 98.6°F | Resp 16 | Wt 104.2 lb

## 2016-04-07 DIAGNOSIS — D702 Other drug-induced agranulocytosis: Secondary | ICD-10-CM

## 2016-04-07 DIAGNOSIS — C50912 Malignant neoplasm of unspecified site of left female breast: Secondary | ICD-10-CM | POA: Diagnosis not present

## 2016-04-07 DIAGNOSIS — Z5111 Encounter for antineoplastic chemotherapy: Secondary | ICD-10-CM

## 2016-04-07 DIAGNOSIS — C799 Secondary malignant neoplasm of unspecified site: Secondary | ICD-10-CM | POA: Diagnosis not present

## 2016-04-07 DIAGNOSIS — C7951 Secondary malignant neoplasm of bone: Secondary | ICD-10-CM

## 2016-04-07 DIAGNOSIS — C50919 Malignant neoplasm of unspecified site of unspecified female breast: Secondary | ICD-10-CM

## 2016-04-07 LAB — CBC WITH DIFFERENTIAL/PLATELET
BASOS ABS: 0.2 10*3/uL — AB (ref 0.0–0.1)
Basophils Relative: 1 %
EOS ABS: 0 10*3/uL (ref 0.0–0.7)
Eosinophils Relative: 0 %
HEMATOCRIT: 23.8 % — AB (ref 36.0–46.0)
Hemoglobin: 7.6 g/dL — ABNORMAL LOW (ref 12.0–15.0)
Lymphocytes Relative: 9 %
Lymphs Abs: 1.5 10*3/uL (ref 0.7–4.0)
MCH: 34.4 pg — ABNORMAL HIGH (ref 26.0–34.0)
MCHC: 31.9 g/dL (ref 30.0–36.0)
MCV: 107.7 fL — ABNORMAL HIGH (ref 78.0–100.0)
MONO ABS: 1.5 10*3/uL — AB (ref 0.1–1.0)
Monocytes Relative: 9 %
NEUTROS PCT: 81 %
Neutro Abs: 13.1 10*3/uL — ABNORMAL HIGH (ref 1.7–7.7)
PLATELETS: 194 10*3/uL (ref 150–400)
RBC: 2.21 MIL/uL — AB (ref 3.87–5.11)
RDW: 18.6 % — AB (ref 11.5–15.5)
WBC: 16.3 10*3/uL — AB (ref 4.0–10.5)

## 2016-04-07 LAB — COMPREHENSIVE METABOLIC PANEL
ALBUMIN: 3.7 g/dL (ref 3.5–5.0)
ALK PHOS: 67 U/L (ref 38–126)
ALT: 12 U/L — ABNORMAL LOW (ref 14–54)
ANION GAP: 9 (ref 5–15)
AST: 31 U/L (ref 15–41)
BILIRUBIN TOTAL: 0.5 mg/dL (ref 0.3–1.2)
BUN: 13 mg/dL (ref 6–20)
CALCIUM: 8.8 mg/dL — AB (ref 8.9–10.3)
CO2: 25 mmol/L (ref 22–32)
CREATININE: 0.86 mg/dL (ref 0.44–1.00)
Chloride: 100 mmol/L — ABNORMAL LOW (ref 101–111)
GFR calc Af Amer: >60 mL/min (ref >60)
GFR calc non Af Amer: >60 mL/min (ref >60)
GLUCOSE: 121 mg/dL — AB (ref 65–99)
Potassium: 3.7 mmol/L (ref 3.5–5.1)
SODIUM: 134 mmol/L — AB (ref 135–145)
TOTAL PROTEIN: 6.7 g/dL (ref 6.5–8.1)

## 2016-04-07 MED ORDER — PROCHLORPERAZINE MALEATE 10 MG PO TABS
10.0000 mg | ORAL_TABLET | Freq: Once | ORAL | Status: AC
  Administered 2016-04-07: 10 mg via ORAL
  Filled 2016-04-07: qty 1

## 2016-04-07 MED ORDER — SODIUM CHLORIDE 0.9 % IV SOLN
1.4000 mg/m2 | Freq: Once | INTRAVENOUS | Status: AC
  Administered 2016-04-07: 2 mg via INTRAVENOUS
  Filled 2016-04-07: qty 4

## 2016-04-07 MED ORDER — HEPARIN SOD (PORK) LOCK FLUSH 100 UNIT/ML IV SOLN: 500.0000 [IU] | Freq: Once | INTRAVENOUS | Status: DC | PRN

## 2016-04-07 MED ORDER — SODIUM CHLORIDE 0.9% FLUSH
10.0000 mL | INTRAVENOUS | Status: DC | PRN
  Administered 2016-04-07: 10 mL
  Filled 2016-04-07: qty 10

## 2016-04-07 MED ORDER — DIPHENHYDRAMINE HCL 25 MG PO CAPS
25.0000 mg | ORAL_CAPSULE | Freq: Once | ORAL | Status: AC
  Administered 2016-04-07: 25 mg via ORAL
  Filled 2016-04-07: qty 1

## 2016-04-07 MED ORDER — HEPARIN SOD (PORK) LOCK FLUSH 100 UNIT/ML IV SOLN
500.0000 [IU] | Freq: Every day | INTRAVENOUS | Status: AC | PRN
  Administered 2016-04-07: 500 [IU]
  Filled 2016-04-07: qty 5

## 2016-04-07 MED ORDER — SODIUM CHLORIDE 0.9 % IV SOLN
250.0000 mL | Freq: Once | INTRAVENOUS | Status: AC
  Administered 2016-04-07: 250 mL via INTRAVENOUS

## 2016-04-07 MED ORDER — SODIUM CHLORIDE 0.9% FLUSH
10.0000 mL | INTRAVENOUS | Status: AC | PRN
  Administered 2016-04-07: 10 mL

## 2016-04-07 MED ORDER — SODIUM CHLORIDE 0.9 % IV SOLN
Freq: Once | INTRAVENOUS | Status: AC
  Administered 2016-04-07: 11:00:00 via INTRAVENOUS

## 2016-04-07 MED ORDER — ACETAMINOPHEN 325 MG PO TABS
650.0000 mg | ORAL_TABLET | Freq: Once | ORAL | Status: AC
  Administered 2016-04-07: 650 mg via ORAL
  Filled 2016-04-07: qty 2

## 2016-04-07 MED ORDER — ONDANSETRON HCL 4 MG PO TABS
8.0000 mg | ORAL_TABLET | Freq: Once | ORAL | Status: AC
  Administered 2016-04-07: 8 mg via ORAL
  Filled 2016-04-07: qty 2

## 2016-04-07 NOTE — Progress Notes (Signed)
Xgeva due next Tuesday with her B12 appointment. Corrected calcium done today is 9.0 per [facility].  Chemotherapy and blood transfusion done today. No problems with treatment or transfusion. Vitals remained stable throughout. Will discharge patient ambulatory, self from clinic.

## 2016-04-07 NOTE — Patient Instructions (Signed)
[facility] Discharge Instructions for Patients Receiving Chemotherapy   Beginning January 23rd 2017 lab work for the [facility] will be done in the  Main lab at [facility] on 1st floor. If you have a lab appointment with the [facility] please come in thru the  Main Entrance and check in at the main information desk   Today you received the following chemotherapy agents Haloven and 2 units of blood.  To help prevent nausea and vomiting after your treatment, we encourage you to take your nausea medication     If you develop nausea and vomiting, or diarrhea that is not controlled by your medication, call the clinic.  The clinic phone number is [phone]. Office hours are Monday-Friday 8:30am-5:00pm.  BELOW ARE SYMPTOMS THAT SHOULD BE REPORTED IMMEDIATELY:  *FEVER GREATER THAN 101.0 F  *CHILLS WITH OR WITHOUT FEVER  NAUSEA AND VOMITING THAT IS NOT CONTROLLED WITH YOUR NAUSEA MEDICATION  *UNUSUAL SHORTNESS OF BREATH  *UNUSUAL BRUISING OR BLEEDING  TENDERNESS IN MOUTH AND THROAT WITH OR WITHOUT PRESENCE OF ULCERS  *URINARY PROBLEMS  *BOWEL PROBLEMS  UNUSUAL RASH Items with * indicate a potential emergency and should be followed up as soon as possible. If you have an emergency after office hours please contact your primary care physician or go to the nearest emergency department.  Please call the clinic during office hours if you have any questions or concerns.   You may also contact the Patient Navigator at [phone] should you have any questions or need assistance in obtaining follow up care.      Resources For Cancer Patients and their Caregivers ? American Cancer Society: Can assist with transportation, wigs, general needs, runs Look Good Feel Better.        ([phone] ? Cancer Care: Provides financial assistance, online support groups, medication/co-pay assistance.  1-800-813-HOPE 850-692-6384) ? [place] Assists [place] cancer patients and their families through emotional , educational and financial support.  707-071-4105 ? [place] DSS Where to apply for food stamps, Medicaid and utility assistance. 986-481-6851 ? RCATS: Transportation to medical appointments. 434-453-9340 ? Social Security Administration: May apply for disability if have a Stage IV cancer. 309-364-9359 414-192-8553 ? [employer], Disability and Transit Services: Assists with nutrition, care and transit needs. [phone]

## 2016-04-07 NOTE — Progress Notes (Signed)
ANC discussed with Dr.Penland in regards to treatment days 11 and 12 order for neupogen. Per MD will not need neupogen days 11 and 12. Patient is to return as scheduled for chemo 10/13 with labs at that time. Patient is aware to notify us of any changes or concerns.

## 2016-04-08 ENCOUNTER — Ambulatory Visit (HOSPITAL_COMMUNITY): Payer: Medicare Other

## 2016-04-08 LAB — TYPE AND SCREEN
ABO/RH(D): A NEG
Antibody Screen: NEGATIVE
UNIT DIVISION: 0
Unit division: 0

## 2016-04-11 ENCOUNTER — Other Ambulatory Visit (HOSPITAL_COMMUNITY): Payer: Self-pay | Admitting: Hematology & Oncology

## 2016-04-11 ENCOUNTER — Other Ambulatory Visit (HOSPITAL_COMMUNITY): Payer: Self-pay | Admitting: Oncology

## 2016-04-11 DIAGNOSIS — C50919 Malignant neoplasm of unspecified site of unspecified female breast: Secondary | ICD-10-CM

## 2016-04-11 MED ORDER — HYDROCODONE-ACETAMINOPHEN 5-325 MG PO TABS: 1.0000 | ORAL_TABLET | Freq: Four times a day (QID) | ORAL | 0 refills | Status: DC | PRN

## 2016-04-12 ENCOUNTER — Encounter (HOSPITAL_COMMUNITY): Payer: Medicare Other | Attending: Hematology & Oncology

## 2016-04-12 VITALS — BP 116/70 | HR 90 | Temp 97.5°F | Resp 16

## 2016-04-12 DIAGNOSIS — E538 Deficiency of other specified B group vitamins: Secondary | ICD-10-CM

## 2016-04-12 DIAGNOSIS — C799 Secondary malignant neoplasm of unspecified site: Secondary | ICD-10-CM | POA: Insufficient documentation

## 2016-04-12 DIAGNOSIS — C7951 Secondary malignant neoplasm of bone: Secondary | ICD-10-CM

## 2016-04-12 DIAGNOSIS — D6481 Anemia due to antineoplastic chemotherapy: Secondary | ICD-10-CM | POA: Diagnosis not present

## 2016-04-12 DIAGNOSIS — C50912 Malignant neoplasm of unspecified site of left female breast: Secondary | ICD-10-CM | POA: Diagnosis not present

## 2016-04-12 DIAGNOSIS — C50919 Malignant neoplasm of unspecified site of unspecified female breast: Secondary | ICD-10-CM | POA: Insufficient documentation

## 2016-04-12 MED ORDER — DENOSUMAB 120 MG/1.7ML ~~LOC~~ SOLN
120.0000 mg | Freq: Once | SUBCUTANEOUS | Status: AC
  Administered 2016-04-12: 120 mg via SUBCUTANEOUS
  Filled 2016-04-12: qty 1.7

## 2016-04-12 MED ORDER — CYANOCOBALAMIN 1000 MCG/ML IJ SOLN
1000.0000 ug | Freq: Once | INTRAMUSCULAR | Status: AC
  Administered 2016-04-12: 1000 ug via INTRAMUSCULAR

## 2016-04-12 MED ORDER — DARBEPOETIN ALFA 500 MCG/ML IJ SOSY
PREFILLED_SYRINGE | INTRAMUSCULAR | Status: AC
  Filled 2016-04-12: qty 1

## 2016-04-12 MED ORDER — DARBEPOETIN ALFA 500 MCG/ML IJ SOSY
500.0000 ug | PREFILLED_SYRINGE | INTRAMUSCULAR | Status: DC
  Administered 2016-04-12: 500 ug via SUBCUTANEOUS

## 2016-04-12 NOTE — Progress Notes (Signed)
Debbie Reyes tolerated Vit B12,Aranesp and Xgeva injections well without complaints or incident Pt denied any tooth/jaw pain prior to administering Xgeva injection. Pt discharged self ambulatory in satisfactory condition

## 2016-04-12 NOTE — Patient Instructions (Signed)
[place] at [facility] Discharge Instructions  RECOMMENDATIONS MADE BY THE CONSULTANT AND ANY TEST RESULTS WILL BE SENT TO YOUR REFERRING PHYSICIAN.  Received Aranesp, Vit B12 and Xgeva injections today. Follow-up as scheduled. Call clinic for any questions or concerns  Thank you for choosing [place] at [facility] to provide your oncology and hematology care.  To afford each patient quality time with our provider, please arrive at least 15 minutes before your scheduled appointment time.   Beginning January 23rd 2017 lab work for the [facility] will be done in the  Main lab at [facility] on 1st floor. If you have a lab appointment with the [facility] please come in thru the  Main Entrance and check in at the main information desk  You need to re-schedule your appointment should you arrive 10 or more minutes late.  We strive to give you quality time with our providers, and arriving late affects you and other patients whose appointments are after yours.  Also, if you no show three or more times for appointments you may be dismissed from the clinic at the providers discretion.     Again, thank you for choosing [facility].  Our hope is that these requests will decrease the amount of time that you wait before being seen by our physicians.       _____________________________________________________________  Should you have questions after your visit to [facility], please contact our office at [phone] between the hours of 8:30 a.m. and 4:30 p.m.  Voicemails left after 4:30 p.m. will not be returned until the following business day.  For prescription refill requests, have your pharmacy contact our office.         Resources For Cancer Patients and their Caregivers ? American Cancer Society: Can assist with transportation, wigs, general needs, runs Look Good Feel Better.        ([phone] [phone] ? Cancer  Care: Provides financial assistance, online support groups, medication/co-pay assistance.  1-800-813-HOPE 828-694-5923) ? [place] Assists [place] cancer patients and their families through emotional , educational and financial support.  3316020561 ? [place] DSS Where to apply for food stamps, Medicaid and utility assistance. 562-187-6777 ? RCATS: Transportation to medical appointments. 443-671-1176 ? Social Security Administration: May apply for disability if have a Stage IV cancer. ([phone] 770-042-3877 ? [employer], Disability and Transit Services: Assists with nutrition, care and transit needs. [place] Support Programs: @10RELATIVEDAYS @ > Cancer Support Group  2nd Tuesday of the month 1pm-2pm, Journey Room  > Creative Journey  3rd Tuesday of the month 1130am-1pm, Journey Room  > Look Good Feel Better  1st Wednesday of the month 10am-12 noon, Journey Room (Call [facility] to register [phone])

## 2016-04-21 ENCOUNTER — Encounter (HOSPITAL_COMMUNITY): Payer: Self-pay | Admitting: Hematology & Oncology

## 2016-04-21 ENCOUNTER — Encounter (HOSPITAL_BASED_OUTPATIENT_CLINIC_OR_DEPARTMENT_OTHER): Payer: Medicare Other | Admitting: Hematology & Oncology

## 2016-04-21 VITALS — BP 79/42 | HR 77 | Temp 97.7°F | Resp 16 | Ht 61.0 in | Wt 100.0 lb

## 2016-04-21 DIAGNOSIS — C50919 Malignant neoplasm of unspecified site of unspecified female breast: Secondary | ICD-10-CM | POA: Diagnosis not present

## 2016-04-21 DIAGNOSIS — R7989 Other specified abnormal findings of blood chemistry: Secondary | ICD-10-CM | POA: Diagnosis not present

## 2016-04-21 DIAGNOSIS — I952 Hypotension due to drugs: Secondary | ICD-10-CM | POA: Diagnosis not present

## 2016-04-21 DIAGNOSIS — C7951 Secondary malignant neoplasm of bone: Secondary | ICD-10-CM

## 2016-04-21 DIAGNOSIS — E538 Deficiency of other specified B group vitamins: Secondary | ICD-10-CM

## 2016-04-21 DIAGNOSIS — D7589 Other specified diseases of blood and blood-forming organs: Secondary | ICD-10-CM

## 2016-04-21 MED ORDER — PROPRANOLOL HCL 10 MG PO TABS: 10.0000 mg | ORAL_TABLET | Freq: Two times a day (BID) | ORAL | 3 refills | Status: AC

## 2016-04-21 NOTE — Progress Notes (Signed)
[name]      HEMATOLOGY/ONCOLOGY PROGRESS NOTE  Date of Service: 03/16/2016  Patient Care Team: Zella Richer. Scotty Court, MD as PCP - General (Unknown Physician Specialty)  CHIEF COMPLAINTS:  Continued management of metastatic breast cancer    Metastatic breast cancer ([place])   06/07/2005 Initial Biopsy    Biopsy L axillary nodal mass, no breast primary identified      08/11/2005 -  Chemotherapy    AC x 4, with 20% dose reduction on last cycle      11/21/2005 - 12/30/2005 Radiation Therapy    XRT 5040 cGY to L breast and L axillae with Dr. Isidore Moos      11/21/2005 - 12/31/2010 Anti-estrogen oral therapy    Arimidex X 5 years      01/20/2011 PET scan    Widespread hypermetabolic osseous metastatic disease. Index lesions are given above. No evidence of hypermetabolic metastatic disease involving the neck, chest, abdomen or pelvis.       01/25/2011 -  Radiation Therapy    1200 cGy to the left rib and 2050 cGy in 20 fractions to the T5-T10 vertebral bodies with a boost of 675 cGy in 3 fractions           01/25/2011 Treatment Plan Change    Faslodex initiated approximately on this date then stopped. Stopped due to failure to respond. Faslodex given in a dose of 250 mg only 3 doses (July 17, July 31, August 14) and a single 500 mg dose March 24, 2011          04/25/2011 - 10/13/2011 Chemotherapy    Taxol chemotherapy given weekly 2 with one week off. Stopped due to progression          10/22/2011 - 05/11/2012 Anti-estrogen oral therapy    Aromasin and everolimus initiating but stopped due to progression          11/11/2011 Initial Biopsy    L3 vertebral body biopsy ER+PR+      11/11/2011 Procedure    Status post vertebral body augmentation for painful pathologic compression fractures at T11 and at L1 using the balloon kyphoplasty technique. Status post vertebral body augmentation for painful pathologic compression fracture at T8 using the vertebroplasty technique.      12/09/2012 - 06/10/2013 Anti-estrogen oral therapy    Fareston      04/22/2013 PET scan    Hypermetabolic left upper lobe nodule, possibly metastatic. The possibility of primary bronchogenic carcinoma is also considered. Residual hypermetabolic metastatic disease involving the liver and bones. Small left pleural effusion, stable or minimally increased from 03/07/2013.      06/11/2013 - 01/08/2014 Chemotherapy    Abraxane      01/31/2014 PET scan    Progressive metastatic lymphadenopathy within the right supraclavicular region and mediastinum.New hypermetabolic left adrenal metastasis. Mixed response of hypermetabolic liver metastases. Mild decrease in hypermetabolic activity associated with 1 cm left upper lobe pulmonary nodule. Diffuse bone metastases, with several in the thorax showing increased hypermetabolic activity consistent with mild progression of bone metastases.      02/01/2014 - 05/06/2015 Chemotherapy    XELODA 750 mg/m2 7 on/7 off       06/11/2014 PET scan    Partial metabolic response. Improving mediastinal lymphadenopathy, as above. Improving left adrenal metastasis. Prior hepatic metastasis is no longer evident.Improving multifocal osseous metastases, as above      05/04/2015 PET scan    Dominant finding is new widespread skeletal metastasis involvingthe axillary and appendicular skeleton. New lesions are not  discretely seen on the CT but recognized as new metabolic activity. New LEFT adrenal gland hypermetabolic metastasis. Foci of metabolic activity in the LEFT lung are felt to relate to atelectasis and/or infection. No clear evidence of pulmonary metastasis Moderate pleural LEFT pleural which could be sampled for cytology if clinically relevant.LEFT adrenal metastasis is actually an enlarged LEFT periaortic lymph node. This lymph node was enlarged and hypermetabolic to a greater degree FDG PET 01/31/2014 and subsequently improved and now has recurred on current exam  (05/04/2015) as a hypermetabolic enlarged LEFT periaortic lymph node.      05/04/2015 Progression    PET with progression of disease      05/06/2015 Treatment Plan Change    Megace      08/14/2015 PET scan    Suspect new hypermetabolic metastasis in posterior right hepatic lobe. Consider abdomen MRI without and with contrast for further evaluation. No significant change in probable malignant left pleural effusion and left lung atelectasis.No significant change in diffuse osseous metastatic disease. Stable hypermetabolic left abdominal paraaortic lymph node, consistent with metastatic disease      09/09/2015 - 03/07/2016 Chemotherapy    IBRANCE/FASLODEX . Ibrance at 100 mg secondary to grade 3 neutropenia      03/03/2016 PET scan    Interval overall progression of disease. Interval development of new and progression of pre-existing lesions in the liver, hypermetabolic on PET imaging, consistent with metastatic disease. Persistent hypermetabolic patchy opacity in the anterior left upper lobe with persistent chronic left pleural effusion. Diffuse bony metastases again noted with interval progression ofFDG uptake associated with the index bone lesions.      03/03/2016 Progression    PET demonstrates progression of disease.      03/11/2016 -  Chemotherapy    Halaven D1, 8 every 21 days and with Neupogen support.         HISTORY OF PRESENTING ILLNESS:   Debbie Reyes is a wonderful 75 y.o. female who is for follow-up metastatic breast cancer. She is currently on halaven.   Patient reports feeling well although she notices she cannot eat large portions anymore. She has Boost at home, but doesn't typically drink it.  Nyjai denies mouth sores, diarrhea, or any new pain. No nausea or vomiting.   She says she has noticed a difference after taking vitamin B12 in energy and tingling in her hands and feet.   She takes blood pressure medication twice a day. Occasional light headedness but  she notes that she just gets up slowly. She denies any falls. BP today and on other occasions has been quite low. She is open to medication change.   Energy level is baseline.   MEDICAL HISTORY:  Past Medical History:  Diagnosis Date  . Bone metastases ([place]) 12/29/2015  . Cancer ([place])   . Low serum vitamin B12 03/18/2016  . Metastatic breast cancer ([place]) 12/29/2015  Depression   anxiety   SURGICAL HISTORY: L1 bone metastases biopsy  Axillary lymph node biopsy   SOCIAL HISTORY: Social History   Social History  . Marital status: Widowed    Spouse name: N/A  . Number of children: N/A  . Years of education: N/A   Occupational History  . Not on file.   Social History Main Topics  . Smoking status: Never Smoker  . Smokeless tobacco: Never Used  . Alcohol use No  . Drug use: No  . Sexual activity: No   Other Topics Concern  . Not on file   Social  History Narrative  . No narrative on file    FAMILY HISTORY: Patient denies family history of breast cancer, ovarian cancer or uterine cancer . Denies any family history of blood clotting or bleeding disorders   ALLERGIES:  has No Known Allergies.  MEDICATIONS:  Current Outpatient Prescriptions  Medication Sig Dispense Refill  . aspirin EC 81 MG tablet Take 81 mg by mouth daily.    [name] denosumab (XGEVA) 120 MG/1.7ML SOLN Inject 120 mg into the skin once. Pt took on 10/13/11    . EriBULin Mesylate (HALAVEN IV) Inject into the vein. Day 1 day 8, every 21 days    . HYDROcodone-acetaminophen (NORCO/VICODIN) 5-325 MG tablet Take 1 tablet by mouth every 6 (six) hours as needed. For pain 100 tablet 0  . lidocaine-prilocaine (EMLA) cream Apply topically as needed. 1 each 2  . LORazepam (ATIVAN) 1 MG tablet Take 1 mg by mouth at bedtime as needed. For sleep    . megestrol (MEGACE) 400 MG/10ML suspension Take 10 mLs (400 mg total) by mouth daily. 240 mL 1  . Melatonin 1 MG CAPS Take by mouth at bedtime as needed.    . ondansetron (ZOFRAN)  8 MG tablet Take 1 tablet (8 mg total) by mouth 2 (two) times daily as needed (Nausea or vomiting). 30 tablet 1  . PARoxetine (PAXIL) 20 MG tablet Take 1 tablet (20 mg total) by mouth daily. 30 tablet 2  . prochlorperazine (COMPAZINE) 10 MG tablet Take 1 tablet (10 mg total) by mouth every 6 (six) hours as needed (Nausea or vomiting). 30 tablet 1  . propranolol (INDERAL) 20 MG tablet Take 20 mg by mouth 2 (two) times daily.     No current facility-administered medications for this visit.     REVIEW OF SYSTEMS:   14 point review of systems was performed and is negative except as detailed under history of present illness and above   PHYSICAL EXAMINATION: ECOG PERFORMANCE STATUS: 1 - Symptomatic but completely ambulatory  . Vitals:   04/21/16 1600  BP: (!) 79/42  Pulse: 77  Resp: 16  Temp: 97.7 F (36.5 C)   Filed Weights   04/21/16 1600  Weight: 100 lb (45.4 kg)   .Body mass index is 18.89 kg/m.  GENERAL:alert, in no acute distress and comfortable SKIN: skin color, texture, turgor are normal, no rashes or significant lesions EYES: normal, conjunctiva are pink and non-injected, sclera clear OROPHARYNX:no exudate, no erythema and lips, buccal mucosa, and tongue normal  NECK: supple, no JVD, thyroid normal size, non-tender, without nodularity LYMPH:  no palpable lymphadenopathy in the cervical, axillary or inguinal or supraclavicular LN LUNGS: clear to auscultation with normal respiratory effort HEART: regular rate & rhythm,  no murmurs and  trace pedal edema. ABDOMEN: abdomen soft, non-tender, normoactive bowel sounds  Musculoskeletal: no cyanosis of digits and no clubbing  PSYCH: alert & oriented x 3 with fluent speech NEURO: no focal motor/sensory deficits  LABORATORY DATA:  I have reviewed the data as listed Results for MONZERAT, HANDLER (MRN [id number])   Ref. Range 04/22/2016 13:46  Sodium Latest Ref Range: 135 - 145 mmol/L 135  Potassium Latest Ref Range: 3.5 - 5.1  mmol/L 4.3  Chloride Latest Ref Range: 101 - 111 mmol/L 100 (L)  CO2 Latest Ref Range: 22 - 32 mmol/L 28  BUN Latest Ref Range: 6 - 20 mg/dL 16  Creatinine Latest Ref Range: 0.44 - 1.00 mg/dL 0.82  Calcium Latest Ref Range: 8.9 - 10.3 mg/dL 9.0  EGFR (Non-African Amer.) Latest Ref Range: >60 mL/min >60  EGFR (African American) Latest Ref Range: >60 mL/min >60  Glucose Latest Ref Range: 65 - 99 mg/dL 87  Anion gap Latest Ref Range: 5 - 15  7  Alkaline Phosphatase Latest Ref Range: 38 - 126 U/L 55  Albumin Latest Ref Range: 3.5 - 5.0 g/dL 3.5  AST Latest Ref Range: 15 - 41 U/L 22  ALT Latest Ref Range: 14 - 54 U/L 10 (L)  Total Protein Latest Ref Range: 6.5 - 8.1 g/dL 6.7  Total Bilirubin Latest Ref Range: 0.3 - 1.2 mg/dL 0.5  WBC Latest Ref Range: 4.0 - 10.5 K/uL 7.6  RBC Latest Ref Range: 3.87 - 5.11 MIL/uL 3.80 (L)  Hemoglobin Latest Ref Range: 12.0 - 15.0 g/dL 12.0  HCT Latest Ref Range: 36.0 - 46.0 % 38.4  MCV Latest Ref Range: 78.0 - 100.0 fL 101.1 (H)  MCH Latest Ref Range: 26.0 - 34.0 pg 31.6  MCHC Latest Ref Range: 30.0 - 36.0 g/dL 31.3  RDW Latest Ref Range: 11.5 - 15.5 % 19.1 (H)  Platelets Latest Ref Range: 150 - 400 K/uL 261  Neutrophils Latest Units: % 59  Lymphocytes Latest Units: % 12  Monocytes Relative Latest Units: % 28  Eosinophil Latest Units: % 0  Basophil Latest Units: % 1  NEUT# Latest Ref Range: 1.7 - 7.7 K/uL 4.5  Lymphocyte # Latest Ref Range: 0.7 - 4.0 K/uL 0.9  Monocyte # Latest Ref Range: 0.1 - 1.0 K/uL 2.1 (H)  Eosinophils Absolute Latest Ref Range: 0.0 - 0.7 K/uL 0.0  Basophils Absolute Latest Ref Range: 0.0 - 0.1 K/uL 0.1   Results for SHAELIN, LALLEY (MRN [id number])   Ref. Range 03/16/2016 11:01 04/01/2016 13:50 04/22/2016 13:46  CA 15-3 Latest Ref Range: 0.0 - 25.0 U/mL 681.6 (H) 515.3 (H) 348.3 (H)   Results for SHAYANNA, THATCH (MRN [id number])   Ref. Range 03/16/2016 11:01 04/01/2016 13:50 04/22/2016 13:46  CA 27.29 Latest Ref Range: 0.0 - 38.6  U/mL 833.8 (H) 655.0 (H) 507.7 (H)     RADIOGRAPHIC STUDIES: I have personally reviewed the radiological images as listed and agreed with the findings in the report. No results found. Study Result   CLINICAL DATA:  Subsequent treatment strategy for breast cancer.  EXAM: NUCLEAR MEDICINE PET SKULL BASE TO THIGH  TECHNIQUE: 5.3 mCi F-18 FDG was injected intravenously. Full-ring PET imaging was performed from the skull base to thigh after the radiotracer. CT data was obtained and used for attenuation correction and anatomic localization.  FASTING BLOOD GLUCOSE:  Value: 106 mg/dl  COMPARISON:  12/15/2015.  FINDINGS: NECK  Increased uptake in the left sternocleidomastoid muscle, likely related to patient motion.  CHEST  The nodular density seen previously in the anterior left upper lobe (image 35 series 8 today) measures 6 mm today with SUV max = 6.3 compared to 6.0 previously. The bandlike linear atelectasis scarring in the anterior left upper lobe is stable without substantial hypermetabolism. Chronic left pleural effusion with pleural thickening again noted. There is activity in the posterior aspect of the collapse/ consolidation at the left lung base, decreased slightly in the interval.  Small right pleural effusion is stable. Right Port-A-Cath tip projects at the SVC/RA junction.  ABDOMEN/PELVIS  Interval development of increasing and new hypermetabolic metastases in the liver. 17 mm hypermetabolic lesion in the tip of the lateral segment left liver was 5 mm previously. SUV max = 8.0 on today's exam.  6 mm hypermetabolic  lesions seen in the medial right liver on the previous study has progressed 17 mm today (image 91 series 4) with SUV max = 6.0.  13 mm hypodense lesion seen previously in the inferior right hepatic lobe is stable. As before, this lesion is not substantially hypermetabolic.  There is abdominal aortic atherosclerosis without  aneurysm.  SKELETON  Multiple hypermetabolic bony metastases are evident.  Sternal lesion measured previously has SUV max = 8.1 today compared to 6.4 previously.  Mid thoracic vertebral body lesion with SUV max = 7.4 today compared to 5.3 previously.  Index lesion in the right iliac crest is 14.5 today compared to 10.3 previously.  IMPRESSION: 1. Interval overall progression of disease. 2. Interval development of new and progression of pre-existing lesions in the liver, hypermetabolic on PET imaging, consistent with metastatic disease. 3. Persistent hypermetabolic patchy opacity in the anterior left upper lobe with persistent chronic left pleural effusion. 4. Diffuse bony metastases again noted with interval progression of FDG uptake associated with the index bone lesions.   Electronically Signed   By: Misty Stanley M.D.   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   She has low B12, will continue on B12 IM.  Folic acid was also marginal and will have her continue OTC folic acid.   Continue XGEVA, calcium and vitamin D.  It concerning to me that her blood pressure is 79/42. I will have her only take 10 mg propanolol twice daily instead of 20 mg twice daily. We will arrange for BP check in one week. She is agreeable with this change.   I encouraged the patient to drink the Boost she has at home.  Patient's tumor marker has come down. We will repeat PET scan after a few more treatments.  Follow up with patient at the start of her next cycle, certainly sooner if needed. She is overall doing quite well.   All of the patients questions were answered to her apparent satisfaction. The  patient knows to call the clinic with any problems, questions or concerns.  This document serves as a record of services personally performed by Ancil Linsey, MD. It was created on her behalf by Elmyra Ricks, a trained medical scribe. The creation of this record is based on the scribe's personal observations and the provider's statements to them. This document has been checked and approved by the attending provider.  I have reviewed the above documentation for accuracy and completeness and I agree with the above.  Kelby Fam. Whitney Muse, MD

## 2016-04-21 NOTE — Patient Instructions (Signed)
[name] at [facility] Discharge Instructions  RECOMMENDATIONS MADE BY THE CONSULTANT AND ANY TEST RESULTS WILL BE SENT TO YOUR REFERRING PHYSICIAN.  You were seen today by Dr. Whitney Muse. Return to clinic cycle 4 day 1 with labs appointment.  Continue Aranesp injections. Inderal Rx given for 10mg  twice a day with 3 refills.    Thank you for choosing [place] at [facility] to provide your oncology and hematology care.  To afford each patient quality time with our provider, please arrive at least 15 minutes before your scheduled appointment time.   Beginning January 23rd 2017 lab work for the [facility] will be done in the  Main lab at [facility] on 1st floor. If you have a lab appointment with the [place] please come in thru the  Main Entrance and check in at the main information desk  You need to re-schedule your appointment should you arrive 10 or more minutes late.  We strive to give you quality time with our providers, and arriving late affects you and other patients whose appointments are after yours.  Also, if you no show three or more times for appointments you may be dismissed from the clinic at the providers discretion.     Again, thank you for choosing [facility].  Our hope is that these requests will decrease the amount of time that you wait before being seen by our physicians.       _____________________________________________________________  Should you have questions after your visit to [facility], please contact our office at [phone] between the hours of 8:30 a.m. and 4:30 p.m.  Voicemails left after 4:30 p.m. will not be returned until the following business day.  For prescription refill requests, have your pharmacy contact our office.         Resources For Cancer Patients and their Caregivers ? American Cancer Society: Can assist with transportation, wigs, general needs, runs  Look Good Feel Better.        407-675-7893 ? Cancer Care: Provides financial assistance, online support groups, medication/co-pay assistance.  1-800-813-HOPE (787)061-1039) ? [place] Assists [place] cancer patients and their families through emotional , educational and financial support.  630-469-4270 ? [place] DSS Where to apply for food stamps, Medicaid and utility assistance. [phone] ? RCATS: Transportation to medical appointments. 878-863-7128 ? Social Security Administration: May apply for disability if have a Stage IV cancer. [phone] ? [employer], Disability and Transit Services: Assists with nutrition, care and transit needs. [place] Support Programs: @10RELATIVEDAYS @ > Cancer Support Group  2nd Tuesday of the month 1pm-2pm, Journey Room  > Creative Journey  3rd Tuesday of the month 1130am-1pm, Journey Room  > Look Good Feel Better  1st Wednesday of the month 10am-12 noon, Journey Room (Call [place] to register [phone])

## 2016-04-22 ENCOUNTER — Ambulatory Visit (HOSPITAL_COMMUNITY): Payer: Medicare Other | Admitting: Hematology & Oncology

## 2016-04-22 ENCOUNTER — Encounter (HOSPITAL_BASED_OUTPATIENT_CLINIC_OR_DEPARTMENT_OTHER): Payer: Medicare Other

## 2016-04-22 VITALS — BP 100/55 | HR 69 | Temp 97.9°F | Resp 18 | Wt 100.0 lb

## 2016-04-22 DIAGNOSIS — Z5111 Encounter for antineoplastic chemotherapy: Secondary | ICD-10-CM

## 2016-04-22 DIAGNOSIS — C50919 Malignant neoplasm of unspecified site of unspecified female breast: Secondary | ICD-10-CM | POA: Diagnosis not present

## 2016-04-22 DIAGNOSIS — E538 Deficiency of other specified B group vitamins: Secondary | ICD-10-CM

## 2016-04-22 DIAGNOSIS — C7951 Secondary malignant neoplasm of bone: Secondary | ICD-10-CM

## 2016-04-22 DIAGNOSIS — C799 Secondary malignant neoplasm of unspecified site: Secondary | ICD-10-CM | POA: Diagnosis not present

## 2016-04-22 DIAGNOSIS — D702 Other drug-induced agranulocytosis: Secondary | ICD-10-CM

## 2016-04-22 LAB — COMPREHENSIVE METABOLIC PANEL
ALBUMIN: 3.5 g/dL (ref 3.5–5.0)
ALK PHOS: 55 U/L (ref 38–126)
ALT: 10 U/L — ABNORMAL LOW (ref 14–54)
AST: 22 U/L (ref 15–41)
Anion gap: 7 (ref 5–15)
BILIRUBIN TOTAL: 0.5 mg/dL (ref 0.3–1.2)
BUN: 16 mg/dL (ref 6–20)
CALCIUM: 9 mg/dL (ref 8.9–10.3)
CO2: 28 mmol/L (ref 22–32)
CREATININE: 0.82 mg/dL (ref 0.44–1.00)
Chloride: 100 mmol/L — ABNORMAL LOW (ref 101–111)
GFR calc Af Amer: >60 mL/min (ref >60)
GLUCOSE: 87 mg/dL (ref 65–99)
Potassium: 4.3 mmol/L (ref 3.5–5.1)
Sodium: 135 mmol/L (ref 135–145)
TOTAL PROTEIN: 6.7 g/dL (ref 6.5–8.1)

## 2016-04-22 LAB — CBC WITH DIFFERENTIAL/PLATELET
BASOS ABS: 0.1 10*3/uL (ref 0.0–0.1)
BASOS PCT: 1 %
Eosinophils Absolute: 0 10*3/uL (ref 0.0–0.7)
Eosinophils Relative: 0 %
HEMATOCRIT: 38.4 % (ref 36.0–46.0)
HEMOGLOBIN: 12 g/dL (ref 12.0–15.0)
LYMPHS PCT: 12 %
Lymphs Abs: 0.9 10*3/uL (ref 0.7–4.0)
MCH: 31.6 pg (ref 26.0–34.0)
MCHC: 31.3 g/dL (ref 30.0–36.0)
MCV: 101.1 fL — AB (ref 78.0–100.0)
MONO ABS: 2.1 10*3/uL — AB (ref 0.1–1.0)
MONOS PCT: 28 %
NEUTROS ABS: 4.5 10*3/uL (ref 1.7–7.7)
NEUTROS PCT: 59 %
Platelets: 261 10*3/uL (ref 150–400)
RBC: 3.8 MIL/uL — ABNORMAL LOW (ref 3.87–5.11)
RDW: 19.1 % — AB (ref 11.5–15.5)
WBC: 7.6 10*3/uL (ref 4.0–10.5)

## 2016-04-22 MED ORDER — SODIUM CHLORIDE 0.9 % IV SOLN
Freq: Once | INTRAVENOUS | Status: AC
  Administered 2016-04-22: 15:00:00 via INTRAVENOUS

## 2016-04-22 MED ORDER — HEPARIN SOD (PORK) LOCK FLUSH 100 UNIT/ML IV SOLN
500.0000 [IU] | Freq: Once | INTRAVENOUS | Status: AC | PRN
  Administered 2016-04-22: 500 [IU]
  Filled 2016-04-22: qty 5

## 2016-04-22 MED ORDER — SODIUM CHLORIDE 0.9% FLUSH
10.0000 mL | INTRAVENOUS | Status: DC | PRN
  Administered 2016-04-22: 10 mL
  Filled 2016-04-22: qty 10

## 2016-04-22 MED ORDER — ONDANSETRON HCL 4 MG PO TABS
8.0000 mg | ORAL_TABLET | Freq: Once | ORAL | Status: AC
  Administered 2016-04-22: 8 mg via ORAL
  Filled 2016-04-22: qty 2

## 2016-04-22 MED ORDER — ERIBULIN MESYLATE CHEMO INJECTION 1 MG/2ML
1.4000 mg/m2 | Freq: Once | INTRAVENOUS | Status: AC
  Administered 2016-04-22: 2 mg via INTRAVENOUS
  Filled 2016-04-22: qty 4

## 2016-04-22 MED ORDER — PROCHLORPERAZINE MALEATE 10 MG PO TABS
10.0000 mg | ORAL_TABLET | Freq: Once | ORAL | Status: AC
  Administered 2016-04-22: 10 mg via ORAL
  Filled 2016-04-22: qty 1

## 2016-04-22 NOTE — Progress Notes (Signed)
Debbie Reyes tolerated chemo tx well without complaints or incident. Labs reviewed prior to administering chemo VSS upon discharge. Pt discharged self ambulatory in satisfactory condition

## 2016-04-22 NOTE — Patient Instructions (Signed)
[facility] Discharge Instructions for Patients Receiving Chemotherapy   Beginning January 23rd 2017 lab work for the [facility] will be done in the  Main lab at [facility] on 1st floor. If you have a lab appointment with the [place] please come in thru the  Main Entrance and check in at the main information desk   Today you received the following chemotherapy agents Halaven. Follow-up as scheduled. Call clinic for any questions or concerns  To help prevent nausea and vomiting after your treatment, we encourage you to take your nausea medication   If you develop nausea and vomiting, or diarrhea that is not controlled by your medication, call the clinic.  The clinic phone number is [phone]. Office hours are Monday-Friday 8:30am-5:00pm.  BELOW ARE SYMPTOMS THAT SHOULD BE REPORTED IMMEDIATELY:  *FEVER GREATER THAN 101.0 F  *CHILLS WITH OR WITHOUT FEVER  NAUSEA AND VOMITING THAT IS NOT CONTROLLED WITH YOUR NAUSEA MEDICATION  *UNUSUAL SHORTNESS OF BREATH  *UNUSUAL BRUISING OR BLEEDING  TENDERNESS IN MOUTH AND THROAT WITH OR WITHOUT PRESENCE OF ULCERS  *URINARY PROBLEMS  *BOWEL PROBLEMS  UNUSUAL RASH Items with * indicate a potential emergency and should be followed up as soon as possible. If you have an emergency after office hours please contact your primary care physician or go to the nearest emergency department.  Please call the clinic during office hours if you have any questions or concerns.   You may also contact the Patient Navigator at [phone] should you have any questions or need assistance in obtaining follow up care.      Resources For Cancer Patients and their Caregivers ? American Cancer Society: Can assist with transportation, wigs, general needs, runs Look Good Feel Better.        [phone] ? Cancer Care: Provides financial assistance, online support groups, medication/co-pay assistance.  1-800-813-HOPE  (878)257-4123) ? [place] Assists [place] cancer patients and their families through emotional , educational and financial support.  781-703-5877 ? [place] DSS Where to apply for food stamps, Medicaid and utility assistance. 4408678360 ? RCATS: Transportation to medical appointments. 531-475-0675 ? Social Security Administration: May apply for disability if have a Stage IV cancer. [phone] ? [employer], Disability and Transit Services: Assists with nutrition, care and transit needs. [phone]

## 2016-04-23 LAB — CANCER ANTIGEN 15-3: CAN 15 3: 348.3 U/mL — AB (ref 0.0–25.0)

## 2016-04-23 LAB — CANCER ANTIGEN 27.29: CA 27.29: 507.7 U/mL — AB (ref 0.0–38.6)

## 2016-04-25 ENCOUNTER — Encounter (HOSPITAL_COMMUNITY): Payer: Self-pay

## 2016-04-25 ENCOUNTER — Encounter (HOSPITAL_BASED_OUTPATIENT_CLINIC_OR_DEPARTMENT_OTHER): Payer: Medicare Other

## 2016-04-25 VITALS — BP 106/64 | HR 106 | Temp 97.7°F | Resp 18

## 2016-04-25 DIAGNOSIS — C50919 Malignant neoplasm of unspecified site of unspecified female breast: Secondary | ICD-10-CM

## 2016-04-25 DIAGNOSIS — D702 Other drug-induced agranulocytosis: Secondary | ICD-10-CM

## 2016-04-25 DIAGNOSIS — C50912 Malignant neoplasm of unspecified site of left female breast: Secondary | ICD-10-CM

## 2016-04-25 MED ORDER — FILGRASTIM 300 MCG/0.5ML IJ SOSY
300.0000 ug | PREFILLED_SYRINGE | Freq: Once | INTRAMUSCULAR | Status: AC
  Administered 2016-04-25: 300 ug via SUBCUTANEOUS
  Filled 2016-04-25: qty 0.5

## 2016-04-25 NOTE — Patient Instructions (Signed)
[place] at [facility] Discharge Instructions  RECOMMENDATIONS MADE BY THE CONSULTANT AND ANY TEST RESULTS WILL BE SENT TO YOUR REFERRING PHYSICIAN.  Neupogen injection today. Return tomorrow as scheduled for neupogen injection. Chemotherapy as scheduled.   Thank you for choosing [place] at [facility] to provide your oncology and hematology care.  To afford each patient quality time with our provider, please arrive at least 15 minutes before your scheduled appointment time.   Beginning January 23rd 2017 lab work for the [facility] will be done in the  Main lab at [facility] on 1st floor. If you have a lab appointment with the [place] please come in thru the  Main Entrance and check in at the main information desk  You need to re-schedule your appointment should you arrive 10 or more minutes late.  We strive to give you quality time with our providers, and arriving late affects you and other patients whose appointments are after yours.  Also, if you no show three or more times for appointments you may be dismissed from the clinic at the providers discretion.     Again, thank you for choosing [facility].  Our hope is that these requests will decrease the amount of time that you wait before being seen by our physicians.       _____________________________________________________________  Should you have questions after your visit to [facility], please contact our office at [phone] between the hours of 8:30 a.m. and 4:30 p.m.  Voicemails left after 4:30 p.m. will not be returned until the following business day.  For prescription refill requests, have your pharmacy contact our office.         Resources For Cancer Patients and their Caregivers ? American Cancer Society: Can assist with transportation, wigs, general needs, runs Look Good Feel Better.        757-319-5675 ? Cancer Care: Provides  financial assistance, online support groups, medication/co-pay assistance.  1-800-813-HOPE (606)653-7922) ? [place] Assists [place] cancer patients and their families through emotional , educational and financial support.  9364721839 ? [place] DSS Where to apply for food stamps, Medicaid and utility assistance. ([phone] ? RCATS: Transportation to medical appointments. ([phone] ? Social Security Administration: May apply for disability if have a Stage IV cancer. [phone] ? [employer], Disability and Transit Services: Assists with nutrition, care and transit needs. [place] Support Programs: @10RELATIVEDAYS @ > Cancer Support Group  2nd Tuesday of the month 1pm-2pm, Journey Room  > Creative Journey  3rd Tuesday of the month 1130am-1pm, Journey Room  > Look Good Feel Better  1st Wednesday of the month 10am-12 noon, Journey Room (Call [place] to register [phone])

## 2016-04-25 NOTE — Progress Notes (Signed)
Debbie Reyes presents today for injection per MD orders. Neupogen 311mcg administered SQ in right Upper Arm per patient preference. Administration without incident. Patient tolerated well.

## 2016-04-26 ENCOUNTER — Encounter (HOSPITAL_BASED_OUTPATIENT_CLINIC_OR_DEPARTMENT_OTHER): Payer: Medicare Other

## 2016-04-26 ENCOUNTER — Encounter (HOSPITAL_COMMUNITY): Payer: Self-pay

## 2016-04-26 VITALS — BP 102/61 | HR 110 | Temp 97.5°F | Resp 16

## 2016-04-26 DIAGNOSIS — C50919 Malignant neoplasm of unspecified site of unspecified female breast: Secondary | ICD-10-CM

## 2016-04-26 DIAGNOSIS — D702 Other drug-induced agranulocytosis: Secondary | ICD-10-CM | POA: Diagnosis present

## 2016-04-26 DIAGNOSIS — H43393 Other vitreous opacities, bilateral: Secondary | ICD-10-CM | POA: Diagnosis not present

## 2016-04-26 MED ORDER — FILGRASTIM 300 MCG/0.5ML IJ SOSY
300.0000 ug | PREFILLED_SYRINGE | Freq: Once | INTRAMUSCULAR | Status: AC
  Administered 2016-04-26: 300 ug via SUBCUTANEOUS
  Filled 2016-04-26: qty 0.5

## 2016-04-26 NOTE — Patient Instructions (Signed)
[place] at [facility] Discharge Instructions  RECOMMENDATIONS MADE BY THE CONSULTANT AND ANY TEST RESULTS WILL BE SENT TO YOUR REFERRING PHYSICIAN.  Neupagen given today Follow up as scheuduled   Thank you for choosing [place] at [facility] to provide your oncology and hematology care.  To afford each patient quality time with our provider, please arrive at least 15 minutes before your scheduled appointment time.   Beginning January 23rd 2017 lab work for the [facility] will be done in the  Main lab at [facility] on 1st floor. If you have a lab appointment with the [place] please come in thru the  Main Entrance and check in at the main information desk  You need to re-schedule your appointment should you arrive 10 or more minutes late.  We strive to give you quality time with our providers, and arriving late affects you and other patients whose appointments are after yours.  Also, if you no show three or more times for appointments you may be dismissed from the clinic at the providers discretion.     Again, thank you for choosing [facility].  Our hope is that these requests will decrease the amount of time that you wait before being seen by our physicians.       _____________________________________________________________  Should you have questions after your visit to [facility], please contact our office at [phone] between the hours of 8:30 a.m. and 4:30 p.m.  Voicemails left after 4:30 p.m. will not be returned until the following business day.  For prescription refill requests, have your pharmacy contact our office.         Resources For Cancer Patients and their Caregivers ? American Cancer Society: Can assist with transportation, wigs, general needs, runs Look Good Feel Better.        [phone] ? Cancer Care: Provides financial assistance, online support groups,  medication/co-pay assistance.  1-800-813-HOPE 309-483-2790) ? [place] Assists [place] cancer patients and their families through emotional , educational and financial support.  [phone] ? [place] DSS Where to apply for food stamps, Medicaid and utility assistance. 765-119-6957 ? RCATS: Transportation to medical appointments. [phone] [phone] ? Social Security Administration: May apply for disability if have a Stage IV cancer. 340-057-9165 5304728309 ? [employer], Disability and Transit Services: Assists with nutrition, care and transit needs. [place] Support Programs: @10RELATIVEDAYS @ > Cancer Support Group  2nd Tuesday of the month 1pm-2pm, Journey Room  > Creative Journey  3rd Tuesday of the month 1130am-1pm, Journey Room  > Look Good Feel Better  1st Wednesday of the month 10am-12 noon, Journey Room (Call [place] to register [phone])

## 2016-04-26 NOTE — Progress Notes (Signed)
Vitals were stable and discharged home ambulatory from clinic

## 2016-04-26 NOTE — Progress Notes (Signed)
NIEKA BLAES presents today for injection per MD orders. Neulasta 6mg  administered SQ in right Abdomen. Administration without incident. Patient tolerated well.

## 2016-04-29 ENCOUNTER — Encounter (HOSPITAL_BASED_OUTPATIENT_CLINIC_OR_DEPARTMENT_OTHER): Payer: Medicare Other

## 2016-04-29 VITALS — BP 121/70 | HR 87 | Temp 97.6°F | Resp 18

## 2016-04-29 DIAGNOSIS — Z5111 Encounter for antineoplastic chemotherapy: Secondary | ICD-10-CM | POA: Diagnosis present

## 2016-04-29 DIAGNOSIS — C50919 Malignant neoplasm of unspecified site of unspecified female breast: Secondary | ICD-10-CM

## 2016-04-29 DIAGNOSIS — C50912 Malignant neoplasm of unspecified site of left female breast: Secondary | ICD-10-CM

## 2016-04-29 DIAGNOSIS — D702 Other drug-induced agranulocytosis: Secondary | ICD-10-CM

## 2016-04-29 DIAGNOSIS — C799 Secondary malignant neoplasm of unspecified site: Secondary | ICD-10-CM | POA: Diagnosis not present

## 2016-04-29 DIAGNOSIS — C7951 Secondary malignant neoplasm of bone: Secondary | ICD-10-CM | POA: Diagnosis not present

## 2016-04-29 LAB — CBC WITH DIFFERENTIAL/PLATELET
BASOS ABS: 0.1 10*3/uL (ref 0.0–0.1)
Basophils Relative: 1 %
EOS PCT: 0 %
Eosinophils Absolute: 0 10*3/uL (ref 0.0–0.7)
HEMATOCRIT: 38.6 % (ref 36.0–46.0)
HEMOGLOBIN: 12.1 g/dL (ref 12.0–15.0)
LYMPHS PCT: 12 %
Lymphs Abs: 1.3 10*3/uL (ref 0.7–4.0)
MCH: 31.7 pg (ref 26.0–34.0)
MCHC: 31.3 g/dL (ref 30.0–36.0)
MCV: 101 fL — AB (ref 78.0–100.0)
MONOS PCT: 23 %
Monocytes Absolute: 2.5 10*3/uL — ABNORMAL HIGH (ref 0.1–1.0)
NEUTROS PCT: 64 %
Neutro Abs: 7.1 10*3/uL (ref 1.7–7.7)
Platelets: 211 10*3/uL (ref 150–400)
RBC: 3.82 MIL/uL — AB (ref 3.87–5.11)
RDW: 19.6 % — AB (ref 11.5–15.5)
WBC: 11 10*3/uL — AB (ref 4.0–10.5)

## 2016-04-29 LAB — COMPREHENSIVE METABOLIC PANEL
ALBUMIN: 3.7 g/dL (ref 3.5–5.0)
ALK PHOS: 80 U/L (ref 38–126)
ALT: 12 U/L — AB (ref 14–54)
AST: 28 U/L (ref 15–41)
Anion gap: 7 (ref 5–15)
BILIRUBIN TOTAL: 0.6 mg/dL (ref 0.3–1.2)
BUN: 14 mg/dL (ref 6–20)
CO2: 27 mmol/L (ref 22–32)
CREATININE: 0.78 mg/dL (ref 0.44–1.00)
Calcium: 9 mg/dL (ref 8.9–10.3)
Chloride: 101 mmol/L (ref 101–111)
GFR calc Af Amer: >60 mL/min (ref >60)
GFR calc non Af Amer: >60 mL/min (ref >60)
GLUCOSE: 98 mg/dL (ref 65–99)
POTASSIUM: 4 mmol/L (ref 3.5–5.1)
Sodium: 135 mmol/L (ref 135–145)
TOTAL PROTEIN: 6.7 g/dL (ref 6.5–8.1)

## 2016-04-29 MED ORDER — SODIUM CHLORIDE 0.9 % IV SOLN
Freq: Once | INTRAVENOUS | Status: AC
  Administered 2016-04-29: 15:00:00 via INTRAVENOUS

## 2016-04-29 MED ORDER — SODIUM CHLORIDE 0.9% FLUSH
10.0000 mL | INTRAVENOUS | Status: DC | PRN
  Administered 2016-04-29: 10 mL
  Filled 2016-04-29: qty 10

## 2016-04-29 MED ORDER — HEPARIN SOD (PORK) LOCK FLUSH 100 UNIT/ML IV SOLN
500.0000 [IU] | Freq: Once | INTRAVENOUS | Status: AC | PRN
  Administered 2016-04-29: 500 [IU]
  Filled 2016-04-29: qty 5

## 2016-04-29 MED ORDER — ONDANSETRON HCL 4 MG PO TABS
8.0000 mg | ORAL_TABLET | Freq: Once | ORAL | Status: AC
  Administered 2016-04-29: 8 mg via ORAL
  Filled 2016-04-29: qty 2

## 2016-04-29 MED ORDER — PROCHLORPERAZINE MALEATE 10 MG PO TABS
10.0000 mg | ORAL_TABLET | Freq: Once | ORAL | Status: AC
  Administered 2016-04-29: 10 mg via ORAL
  Filled 2016-04-29: qty 1

## 2016-04-29 MED ORDER — SODIUM CHLORIDE 0.9 % IV SOLN
1.4000 mg/m2 | Freq: Once | INTRAVENOUS | Status: AC
  Administered 2016-04-29: 2 mg via INTRAVENOUS
  Filled 2016-04-29: qty 4

## 2016-04-29 NOTE — Progress Notes (Signed)
Debbie Reyes tolerated chemo tx well without complaints or incident. Labs reviewed prior to administering chemo. VSS upon discharge. Pt discharged self ambulatory in satisfactory condition

## 2016-04-29 NOTE — Patient Instructions (Signed)
[facility] Discharge Instructions for Patients Receiving Chemotherapy   Beginning January 23rd 2017 lab work for the [facility] will be done in the  Main lab at [facility] on 1st floor. If you have a lab appointment with the [place] please come in thru the  Main Entrance and check in at the main information desk   Today you received the following chemotherapy agents Halaven. Follow-up as scheduled. Call clinic for any questions or concerns  To help prevent nausea and vomiting after your treatment, we encourage you to take your nausea medication    If you develop nausea and vomiting, or diarrhea that is not controlled by your medication, call the clinic.  The clinic phone number is [phone]. Office hours are Monday-Friday 8:30am-5:00pm.  BELOW ARE SYMPTOMS THAT SHOULD BE REPORTED IMMEDIATELY:  *FEVER GREATER THAN 101.0 F  *CHILLS WITH OR WITHOUT FEVER  NAUSEA AND VOMITING THAT IS NOT CONTROLLED WITH YOUR NAUSEA MEDICATION  *UNUSUAL SHORTNESS OF BREATH  *UNUSUAL BRUISING OR BLEEDING  TENDERNESS IN MOUTH AND THROAT WITH OR WITHOUT PRESENCE OF ULCERS  *URINARY PROBLEMS  *BOWEL PROBLEMS  UNUSUAL RASH Items with * indicate a potential emergency and should be followed up as soon as possible. If you have an emergency after office hours please contact your primary care physician or go to the nearest emergency department.  Please call the clinic during office hours if you have any questions or concerns.   You may also contact the Patient Navigator at [phone] should you have any questions or need assistance in obtaining follow up care.      Resources For Cancer Patients and their Caregivers ? American Cancer Society: Can assist with transportation, wigs, general needs, runs Look Good Feel Better.        [phone] ? Cancer Care: Provides financial assistance, online support groups, medication/co-pay assistance.  1-800-813-HOPE  727-762-2680) ? [place] Assists [place] cancer patients and their families through emotional , educational and financial support.  351-051-7876 ? [place] DSS Where to apply for food stamps, Medicaid and utility assistance. (818)755-2383 ? RCATS: Transportation to medical appointments. 870-461-2459 ? Social Security Administration: May apply for disability if have a Stage IV cancer. 858-653-6075 [phone] ? [employer], Disability and Transit Services: Assists with nutrition, care and transit needs. [phone]

## 2016-05-02 ENCOUNTER — Encounter (HOSPITAL_BASED_OUTPATIENT_CLINIC_OR_DEPARTMENT_OTHER): Payer: Medicare Other

## 2016-05-02 VITALS — BP 121/68 | HR 89 | Temp 98.1°F | Resp 18

## 2016-05-02 DIAGNOSIS — D701 Agranulocytosis secondary to cancer chemotherapy: Secondary | ICD-10-CM

## 2016-05-02 DIAGNOSIS — C50919 Malignant neoplasm of unspecified site of unspecified female breast: Secondary | ICD-10-CM

## 2016-05-02 DIAGNOSIS — D702 Other drug-induced agranulocytosis: Secondary | ICD-10-CM

## 2016-05-02 MED ORDER — FILGRASTIM 300 MCG/0.5ML IJ SOSY
300.0000 ug | PREFILLED_SYRINGE | Freq: Once | INTRAMUSCULAR | Status: AC
  Administered 2016-05-02: 300 ug via SUBCUTANEOUS
  Filled 2016-05-02: qty 0.5

## 2016-05-02 NOTE — Patient Instructions (Signed)
[place] at [facility] Discharge Instructions  RECOMMENDATIONS MADE BY THE CONSULTANT AND ANY TEST RESULTS WILL BE SENT TO YOUR REFERRING PHYSICIAN.  Received Neupogen injection today. Follow-up as scheduled. Call clinic for any questions or concerns  Thank you for choosing [place] at [facility] to provide your oncology and hematology care.  To afford each patient quality time with our provider, please arrive at least 15 minutes before your scheduled appointment time.   Beginning January 23rd 2017 lab work for the [facility] will be done in the  Main lab at [facility] on 1st floor. If you have a lab appointment with the [place] please come in thru the  Main Entrance and check in at the main information desk  You need to re-schedule your appointment should you arrive 10 or more minutes late.  We strive to give you quality time with our providers, and arriving late affects you and other patients whose appointments are after yours.  Also, if you no show three or more times for appointments you may be dismissed from the clinic at the providers discretion.     Again, thank you for choosing [facility].  Our hope is that these requests will decrease the amount of time that you wait before being seen by our physicians.       _____________________________________________________________  Should you have questions after your visit to [facility], please contact our office at [phone] between the hours of 8:30 a.m. and 4:30 p.m.  Voicemails left after 4:30 p.m. will not be returned until the following business day.  For prescription refill requests, have your pharmacy contact our office.         Resources For Cancer Patients and their Caregivers ? American Cancer Society: Can assist with transportation, wigs, general needs, runs Look Good Feel Better.        949-218-6737 ? Cancer Care: Provides  financial assistance, online support groups, medication/co-pay assistance.  1-800-813-HOPE (810)311-5719) ? [place] Assists [place] cancer patients and their families through emotional , educational and financial support.  [phone] ? [place] DSS Where to apply for food stamps, Medicaid and utility assistance. 778-729-9957 ? RCATS: Transportation to medical appointments. 260-797-3056 ? Social Security Administration: May apply for disability if have a Stage IV cancer. [phone] 985-781-3231 ? [employer], Disability and Transit Services: Assists with nutrition, care and transit needs. [place] Support Programs: @10RELATIVEDAYS @ > Cancer Support Group  2nd Tuesday of the month 1pm-2pm, Journey Room  > Creative Journey  3rd Tuesday of the month 1130am-1pm, Journey Room  > Look Good Feel Better  1st Wednesday of the month 10am-12 noon, Journey Room (Call [place] to register [phone])

## 2016-05-02 NOTE — Progress Notes (Signed)
Debbie Reyes tolerated Neupogen injection well without complaints or incident. Pt discharged self ambulatory in satisfactory condition

## 2016-05-03 ENCOUNTER — Encounter (HOSPITAL_COMMUNITY): Payer: Self-pay

## 2016-05-03 ENCOUNTER — Encounter (HOSPITAL_BASED_OUTPATIENT_CLINIC_OR_DEPARTMENT_OTHER): Payer: Medicare Other

## 2016-05-03 VITALS — BP 106/78 | HR 96 | Temp 98.0°F | Resp 18

## 2016-05-03 DIAGNOSIS — C50919 Malignant neoplasm of unspecified site of unspecified female breast: Secondary | ICD-10-CM

## 2016-05-03 DIAGNOSIS — D702 Other drug-induced agranulocytosis: Secondary | ICD-10-CM

## 2016-05-03 DIAGNOSIS — D701 Agranulocytosis secondary to cancer chemotherapy: Secondary | ICD-10-CM

## 2016-05-03 MED ORDER — FILGRASTIM 300 MCG/0.5ML IJ SOSY
300.0000 ug | PREFILLED_SYRINGE | Freq: Once | INTRAMUSCULAR | Status: AC
  Administered 2016-05-03: 300 ug via SUBCUTANEOUS
  Filled 2016-05-03: qty 0.5

## 2016-05-03 NOTE — Patient Instructions (Signed)
[place] at [facility] Discharge Instructions  RECOMMENDATIONS MADE BY THE CONSULTANT AND ANY TEST RESULTS WILL BE SENT TO YOUR REFERRING PHYSICIAN.  Neupogen injection today. Return as scheduled for chemotherapy. Return as scheduled for office visit.   Thank you for choosing [place] at [facility] to provide your oncology and hematology care.  To afford each patient quality time with our provider, please arrive at least 15 minutes before your scheduled appointment time.   Beginning January 23rd 2017 lab work for the [facility] will be done in the  Main lab at [facility] on 1st floor. If you have a lab appointment with the [place] please come in thru the  Main Entrance and check in at the main information desk  You need to re-schedule your appointment should you arrive 10 or more minutes late.  We strive to give you quality time with our providers, and arriving late affects you and other patients whose appointments are after yours.  Also, if you no show three or more times for appointments you may be dismissed from the clinic at the providers discretion.     Again, thank you for choosing [facility].  Our hope is that these requests will decrease the amount of time that you wait before being seen by our physicians.       _____________________________________________________________  Should you have questions after your visit to [facility], please contact our office at [phone] between the hours of 8:30 a.m. and 4:30 p.m.  Voicemails left after 4:30 p.m. will not be returned until the following business day.  For prescription refill requests, have your pharmacy contact our office.         Resources For Cancer Patients and their Caregivers ? American Cancer Society: Can assist with transportation, wigs, general needs, runs Look Good Feel Better.        352-026-1188 ? Cancer Care: Provides  financial assistance, online support groups, medication/co-pay assistance.  1-800-813-HOPE [phone]) ? [place] Assists [place] cancer patients and their families through emotional , educational and financial support.  651-063-2988 ? [place] DSS Where to apply for food stamps, Medicaid and utility assistance. [phone] ? RCATS: Transportation to medical appointments. 718-146-9656 ? Social Security Administration: May apply for disability if have a Stage IV cancer. [phone] 628-026-5712 ? [employer], Disability and Transit Services: Assists with nutrition, care and transit needs. [place] Support Programs: @10RELATIVEDAYS @ > Cancer Support Group  2nd Tuesday of the month 1pm-2pm, Journey Room  > Creative Journey  3rd Tuesday of the month 1130am-1pm, Journey Room  > Look Good Feel Better  1st Wednesday of the month 10am-12 noon, Journey Room (Call [place] to register [phone])

## 2016-05-03 NOTE — Progress Notes (Signed)
Debbie Reyes presents today for injection per the provider's orders.  Neupogen administration without incident; see MAR for injection details.  Patient tolerated procedure well and without incident.  No questions or complaints noted at this time.

## 2016-05-10 ENCOUNTER — Encounter (HOSPITAL_COMMUNITY): Payer: Self-pay

## 2016-05-10 ENCOUNTER — Encounter (HOSPITAL_COMMUNITY): Payer: Medicare Other

## 2016-05-10 ENCOUNTER — Encounter (HOSPITAL_BASED_OUTPATIENT_CLINIC_OR_DEPARTMENT_OTHER): Payer: Medicare Other

## 2016-05-10 VITALS — BP 105/54 | HR 91 | Temp 97.4°F | Resp 18

## 2016-05-10 DIAGNOSIS — C799 Secondary malignant neoplasm of unspecified site: Secondary | ICD-10-CM | POA: Diagnosis not present

## 2016-05-10 DIAGNOSIS — C50919 Malignant neoplasm of unspecified site of unspecified female breast: Secondary | ICD-10-CM

## 2016-05-10 DIAGNOSIS — C50912 Malignant neoplasm of unspecified site of left female breast: Secondary | ICD-10-CM | POA: Diagnosis not present

## 2016-05-10 DIAGNOSIS — C7951 Secondary malignant neoplasm of bone: Secondary | ICD-10-CM | POA: Diagnosis present

## 2016-05-10 LAB — COMPREHENSIVE METABOLIC PANEL
ALT: 14 U/L (ref 14–54)
AST: 25 U/L (ref 15–41)
Albumin: 3.7 g/dL (ref 3.5–5.0)
Alkaline Phosphatase: 65 U/L (ref 38–126)
Anion gap: 6 (ref 5–15)
BUN: 18 mg/dL (ref 6–20)
CHLORIDE: 102 mmol/L (ref 101–111)
CO2: 28 mmol/L (ref 22–32)
CREATININE: 0.88 mg/dL (ref 0.44–1.00)
Calcium: 9.2 mg/dL (ref 8.9–10.3)
GFR calc Af Amer: >60 mL/min (ref >60)
GFR calc non Af Amer: >60 mL/min (ref >60)
Glucose, Bld: 82 mg/dL (ref 65–99)
Potassium: 4 mmol/L (ref 3.5–5.1)
SODIUM: 136 mmol/L (ref 135–145)
Total Bilirubin: 0.5 mg/dL (ref 0.3–1.2)
Total Protein: 6.9 g/dL (ref 6.5–8.1)

## 2016-05-10 MED ORDER — DENOSUMAB 120 MG/1.7ML ~~LOC~~ SOLN
120.0000 mg | Freq: Once | SUBCUTANEOUS | Status: AC
  Administered 2016-05-10: 120 mg via SUBCUTANEOUS
  Filled 2016-05-10: qty 1.7

## 2016-05-10 NOTE — Progress Notes (Signed)
Debbie Reyes presents today for injection per MD orders. Xgeva 120mg  administered SQ in right Abdomen. Administration without incident. Patient tolerated well.  Vitals stable, discharged from clinic, ambulatory.

## 2016-05-10 NOTE — Patient Instructions (Signed)
[facility] at [facility] Discharge Instructions  RECOMMENDATIONS MADE BY THE CONSULTANT AND ANY TEST RESULTS WILL BE SENT TO YOUR REFERRING PHYSICIAN.  Xgeva given today Follow up as scheduled  Thank you for choosing [facility] at [facility] to provide your oncology and hematology care.  To afford each patient quality time with our provider, please arrive at least 15 minutes before your scheduled appointment time.   Beginning January 23rd 2017 lab work for the Cancer Center will be done in the  Main lab at [facility] on 1st floor. If you have a lab appointment with the Cancer Center please come in thru the  Main Entrance and check in at the main information desk  You need to re-schedule your appointment should you arrive 10 or more minutes late.  We strive to give you quality time with our providers, and arriving late affects you and other patients whose appointments are after yours.  Also, if you no show three or more times for appointments you may be dismissed from the clinic at the providers discretion.     Again, thank you for choosing [facility].  Our hope is that these requests will decrease the amount of time that you wait before being seen by our physicians.       _____________________________________________________________  Should you have questions after your visit to [facility], please contact our office at [phone] between the hours of 8:30 a.m. and 4:30 p.m.  Voicemails left after 4:30 p.m. will not be returned until the following business day.  For prescription refill requests, have your pharmacy contact our office.         Resources For Cancer Patients and their Caregivers ? American Cancer Society: Can assist with transportation, wigs, general needs, runs Look Good Feel Better.        1-888-227-6333 ? Cancer Care: Provides financial assistance, online support groups, medication/co-pay  assistance.  1-800-813-HOPE (4673) ? [facility] Assists [place] cancer patients and their families through emotional , educational and financial support.  336-427-4357 ? [place] DSS Where to apply for food stamps, Medicaid and utility assistance. 336-342-1394 ? RCATS: Transportation to medical appointments. 336-347-2287 ? Social Security Administration: May apply for disability if have a Stage IV cancer. 336-342-7796 1-800-772-1213 ? [place] [employer], Disability and Transit Services: Assists with nutrition, care and transit needs. 336-349-2343  Cancer Center Support Programs: @10RELATIVEDAYS@ > Cancer Support Group  2nd Tuesday of the month 1pm-2pm, Journey Room  > Creative Journey  3rd Tuesday of the month 1130am-1pm, Journey Room  > Look Good Feel Better  1st Wednesday of the month 10am-12 noon, Journey Room (Call American Cancer [employer] to register 1-800-395-5775)    

## 2016-05-13 ENCOUNTER — Encounter (HOSPITAL_BASED_OUTPATIENT_CLINIC_OR_DEPARTMENT_OTHER): Payer: Medicare Other

## 2016-05-13 ENCOUNTER — Encounter (HOSPITAL_COMMUNITY): Payer: Self-pay | Admitting: Hematology & Oncology

## 2016-05-13 ENCOUNTER — Encounter (HOSPITAL_COMMUNITY): Payer: Medicare Other | Attending: Hematology & Oncology | Admitting: Hematology & Oncology

## 2016-05-13 VITALS — BP 97/55 | HR 79 | Temp 97.5°F | Resp 18 | Wt 101.8 lb

## 2016-05-13 DIAGNOSIS — E538 Deficiency of other specified B group vitamins: Secondary | ICD-10-CM | POA: Diagnosis not present

## 2016-05-13 DIAGNOSIS — Z23 Encounter for immunization: Secondary | ICD-10-CM | POA: Diagnosis not present

## 2016-05-13 DIAGNOSIS — C7951 Secondary malignant neoplasm of bone: Secondary | ICD-10-CM

## 2016-05-13 DIAGNOSIS — Z5111 Encounter for antineoplastic chemotherapy: Secondary | ICD-10-CM | POA: Diagnosis not present

## 2016-05-13 DIAGNOSIS — D702 Other drug-induced agranulocytosis: Secondary | ICD-10-CM

## 2016-05-13 DIAGNOSIS — Z170 Estrogen receptor positive status [ER+]: Secondary | ICD-10-CM | POA: Diagnosis not present

## 2016-05-13 DIAGNOSIS — C50919 Malignant neoplasm of unspecified site of unspecified female breast: Secondary | ICD-10-CM

## 2016-05-13 DIAGNOSIS — C50912 Malignant neoplasm of unspecified site of left female breast: Secondary | ICD-10-CM | POA: Diagnosis not present

## 2016-05-13 DIAGNOSIS — D701 Agranulocytosis secondary to cancer chemotherapy: Secondary | ICD-10-CM | POA: Diagnosis not present

## 2016-05-13 DIAGNOSIS — C799 Secondary malignant neoplasm of unspecified site: Secondary | ICD-10-CM | POA: Insufficient documentation

## 2016-05-13 DIAGNOSIS — D6481 Anemia due to antineoplastic chemotherapy: Secondary | ICD-10-CM | POA: Diagnosis not present

## 2016-05-13 DIAGNOSIS — D7589 Other specified diseases of blood and blood-forming organs: Secondary | ICD-10-CM

## 2016-05-13 DIAGNOSIS — Z0000 Encounter for general adult medical examination without abnormal findings: Secondary | ICD-10-CM

## 2016-05-13 LAB — COMPREHENSIVE METABOLIC PANEL
ALK PHOS: 61 U/L (ref 38–126)
ALT: 11 U/L — ABNORMAL LOW (ref 14–54)
ANION GAP: 8 (ref 5–15)
AST: 26 U/L (ref 15–41)
Albumin: 3.7 g/dL (ref 3.5–5.0)
BILIRUBIN TOTAL: 0.6 mg/dL (ref 0.3–1.2)
BUN: 23 mg/dL — ABNORMAL HIGH (ref 6–20)
CALCIUM: 9 mg/dL (ref 8.9–10.3)
CO2: 25 mmol/L (ref 22–32)
Chloride: 101 mmol/L (ref 101–111)
Creatinine, Ser: 0.89 mg/dL (ref 0.44–1.00)
GLUCOSE: 122 mg/dL — AB (ref 65–99)
Potassium: 3.6 mmol/L (ref 3.5–5.1)
Sodium: 134 mmol/L — ABNORMAL LOW (ref 135–145)
TOTAL PROTEIN: 6.8 g/dL (ref 6.5–8.1)

## 2016-05-13 LAB — CBC WITH DIFFERENTIAL/PLATELET
Basophils Absolute: 0.1 10*3/uL (ref 0.0–0.1)
Basophils Relative: 1 %
Eosinophils Absolute: 0 10*3/uL (ref 0.0–0.7)
Eosinophils Relative: 0 %
HEMATOCRIT: 36.6 % (ref 36.0–46.0)
HEMOGLOBIN: 11.6 g/dL — AB (ref 12.0–15.0)
LYMPHS ABS: 1 10*3/uL (ref 0.7–4.0)
Lymphocytes Relative: 10 %
MCH: 31.2 pg (ref 26.0–34.0)
MCHC: 31.7 g/dL (ref 30.0–36.0)
MCV: 98.4 fL (ref 78.0–100.0)
MONO ABS: 1.2 10*3/uL — AB (ref 0.1–1.0)
MONOS PCT: 12 %
NEUTROS ABS: 7.8 10*3/uL — AB (ref 1.7–7.7)
NEUTROS PCT: 77 %
Platelets: 215 10*3/uL (ref 150–400)
RBC: 3.72 MIL/uL — ABNORMAL LOW (ref 3.87–5.11)
RDW: 19.1 % — ABNORMAL HIGH (ref 11.5–15.5)
WBC: 10.1 10*3/uL (ref 4.0–10.5)

## 2016-05-13 MED ORDER — PNEUMOCOCCAL 13-VAL CONJ VACC IM SUSP
INTRAMUSCULAR | Status: AC
  Filled 2016-05-13: qty 0.5

## 2016-05-13 MED ORDER — HEPARIN SOD (PORK) LOCK FLUSH 100 UNIT/ML IV SOLN
500.0000 [IU] | Freq: Once | INTRAVENOUS | Status: AC | PRN
  Administered 2016-05-13: 500 [IU]
  Filled 2016-05-13: qty 5

## 2016-05-13 MED ORDER — SODIUM CHLORIDE 0.9% FLUSH
10.0000 mL | INTRAVENOUS | Status: DC | PRN
  Administered 2016-05-13: 10 mL
  Filled 2016-05-13: qty 10

## 2016-05-13 MED ORDER — CYANOCOBALAMIN 1000 MCG/ML IJ SOLN
1000.0000 ug | Freq: Once | INTRAMUSCULAR | Status: AC
  Administered 2016-05-13: 1000 ug via INTRAMUSCULAR
  Filled 2016-05-13: qty 1

## 2016-05-13 MED ORDER — SODIUM CHLORIDE 0.9 % IV SOLN
1.4000 mg/m2 | Freq: Once | INTRAVENOUS | Status: AC
  Administered 2016-05-13: 2 mg via INTRAVENOUS
  Filled 2016-05-13: qty 4

## 2016-05-13 MED ORDER — ONDANSETRON HCL 4 MG PO TABS
8.0000 mg | ORAL_TABLET | Freq: Once | ORAL | Status: AC
  Administered 2016-05-13: 8 mg via ORAL
  Filled 2016-05-13: qty 2

## 2016-05-13 MED ORDER — SODIUM CHLORIDE 0.9 % IV SOLN
Freq: Once | INTRAVENOUS | Status: AC
  Administered 2016-05-13: 10:00:00 via INTRAVENOUS

## 2016-05-13 MED ORDER — PNEUMOCOCCAL 13-VAL CONJ VACC IM SUSP
0.5000 mL | Freq: Once | INTRAMUSCULAR | Status: AC
  Administered 2016-05-13: 0.5 mL via INTRAMUSCULAR

## 2016-05-13 MED ORDER — HYDROCODONE-ACETAMINOPHEN 5-325 MG PO TABS: 1.0000 | ORAL_TABLET | Freq: Four times a day (QID) | ORAL | 0 refills | Status: DC | PRN

## 2016-05-13 MED ORDER — PROCHLORPERAZINE MALEATE 10 MG PO TABS
10.0000 mg | ORAL_TABLET | Freq: Once | ORAL | Status: AC
  Administered 2016-05-13: 10 mg via ORAL
  Filled 2016-05-13: qty 1

## 2016-05-13 NOTE — Patient Instructions (Signed)
[place] at [facility] Discharge Instructions  RECOMMENDATIONS MADE BY THE CONSULTANT AND ANY TEST RESULTS WILL BE SENT TO YOUR REFERRING PHYSICIAN.  You saw Dr.Penland today.  Prevnar today then pneumovax in 8 weeks.  PET scan on 12/18.  Continue current treatment regimen.  Follow up with MD, labs and next cycle on 12/1.  See Amy at checkout for appointments.  Thank you for choosing [place] at [facility] to provide your oncology and hematology care.  To afford each patient quality time with our provider, please arrive at least 15 minutes before your scheduled appointment time.   Beginning January 23rd 2017 lab work for the [facility] will be done in the  Main lab at [facility] on 1st floor. If you have a lab appointment with the [facility] please come in thru the  Main Entrance and check in at the main information desk  You need to re-schedule your appointment should you arrive 10 or more minutes late.  We strive to give you quality time with our providers, and arriving late affects you and other patients whose appointments are after yours.  Also, if you no show three or more times for appointments you may be dismissed from the clinic at the providers discretion.     Again, thank you for choosing [facility].  Our hope is that these requests will decrease the amount of time that you wait before being seen by our physicians.       _____________________________________________________________  Should you have questions after your visit to [facility], please contact our office at [phone] between the hours of 8:30 a.m. and 4:30 p.m.  Voicemails left after 4:30 p.m. will not be returned until the following business day.  For prescription refill requests, have your pharmacy contact our office.         Resources For Cancer Patients and their Caregivers ? American Cancer Society: Can assist with  transportation, wigs, general needs, runs Look Good Feel Better.        906-200-9155 ? Cancer Care: Provides financial assistance, online support groups, medication/co-pay assistance.  1-800-813-HOPE 312 607 5386) ? [facility] Assists [place] cancer patients and their families through emotional , educational and financial support.  206-145-9546 ? [place] DSS Where to apply for food stamps, Medicaid and utility assistance. 3850030880 ? RCATS: Transportation to medical appointments. 762-733-5878 ? Social Security Administration: May apply for disability if have a Stage IV cancer. 8140287209 858-040-8843 ? [employer], Disability and Transit Services: Assists with nutrition, care and transit needs. [place] Support Programs: @10RELATIVEDAYS @ > Cancer Support Group  2nd Tuesday of the month 1pm-2pm, Journey Room  > Creative Journey  3rd Tuesday of the month 1130am-1pm, Journey Room  > Look Good Feel Better  1st Wednesday of the month 10am-12 noon, Journey Room (Call [place] to register [phone])

## 2016-05-13 NOTE — Patient Instructions (Addendum)
[facility] Discharge Instructions for Patients Receiving Chemotherapy   Beginning January 23rd 2017 lab work for the [facility] will be done in the  Main lab at [facility] on 1st floor. If you have a lab appointment with the [facility] please come in thru the  Main Entrance and check in at the main information desk   Today you received the following chemotherapy agents [place] as well as Prevnar immunization and Vitamin B12 inj Follow-up as scheduled. Call clinic for any questions or concerns  To help prevent nausea and vomiting after your treatment, we encourage you to take your nausea medication   If you develop nausea and vomiting, or diarrhea that is not controlled by your medication, call the clinic.  The clinic phone number is [phone]. Office hours are Monday-Friday 8:30am-5:00pm.  BELOW ARE SYMPTOMS THAT SHOULD BE REPORTED IMMEDIATELY:  *FEVER GREATER THAN 101.0 F  *CHILLS WITH OR WITHOUT FEVER  NAUSEA AND VOMITING THAT IS NOT CONTROLLED WITH YOUR NAUSEA MEDICATION  *UNUSUAL SHORTNESS OF BREATH  *UNUSUAL BRUISING OR BLEEDING  TENDERNESS IN MOUTH AND THROAT WITH OR WITHOUT PRESENCE OF ULCERS  *URINARY PROBLEMS  *BOWEL PROBLEMS  UNUSUAL RASH Items with * indicate a potential emergency and should be followed up as soon as possible. If you have an emergency after office hours please contact your primary care physician or go to the nearest emergency department.  Please call the clinic during office hours if you have any questions or concerns.   You may also contact the Patient Navigator at [phone] should you have any questions or need assistance in obtaining follow up care.      Resources For Cancer Patients and their Caregivers ? American Cancer Society: Can assist with transportation, wigs, general needs, runs Look Good Feel Better.        [phone] ? Cancer Care: Provides financial assistance, online support groups,  medication/co-pay assistance.  1-800-813-HOPE (631)650-7582) ? [facility] Assists [place] cancer patients and their families through emotional , educational and financial support.  (571)090-3951 ? [place] DSS Where to apply for food stamps, Medicaid and utility assistance. 251-463-7265 ? RCATS: Transportation to medical appointments. 937-641-8131 ? Social Security Administration: May apply for disability if have a Stage IV cancer. (541) 404-6865 (930)159-6577 ? [employer], Disability and Transit Services: Assists with nutrition, care and transit needs. 8506696082

## 2016-05-13 NOTE — Progress Notes (Signed)
Debbie Reyes Kitchen      HEMATOLOGY/ONCOLOGY PROGRESS NOTE  Date of Service: 03/16/2016  Patient Care Team: Zella Richer. Scotty Court, MD as PCP - General (Unknown Physician Specialty)  CHIEF COMPLAINTS:  Continued management of metastatic breast cancer    Metastatic breast cancer ([place])   06/07/2005 Initial Biopsy    Biopsy L axillary nodal mass, no breast primary identified      08/11/2005 -  Chemotherapy    AC x 4, with 20% dose reduction on last cycle      11/21/2005 - 12/30/2005 Radiation Therapy    XRT 5040 cGY to L breast and L axillae with Dr. Isidore Moos      11/21/2005 - 12/31/2010 Anti-estrogen oral therapy    Arimidex X 5 years      01/20/2011 PET scan    Widespread hypermetabolic osseous metastatic disease. Index lesions are given above. No evidence of hypermetabolic metastatic disease involving the neck, chest, abdomen or pelvis.       01/25/2011 -  Radiation Therapy    1200 cGy to the left rib and 2050 cGy in 20 fractions to the T5-T10 vertebral bodies with a boost of 675 cGy in 3 fractions           01/25/2011 Treatment Plan Change    Faslodex initiated approximately on this date then stopped. Stopped due to failure to respond. Faslodex given in a dose of 250 mg only 3 doses (July 17, July 31, August 14) and a single 500 mg dose March 24, 2011          04/25/2011 - 10/13/2011 Chemotherapy    Taxol chemotherapy given weekly 2 with one week off. Stopped due to progression          10/22/2011 - 05/11/2012 Anti-estrogen oral therapy    Aromasin and everolimus initiating but stopped due to progression          11/11/2011 Initial Biopsy    L3 vertebral body biopsy ER+PR+      11/11/2011 Procedure    Status post vertebral body augmentation for painful pathologic compression fractures at T11 and at L1 using the balloon kyphoplasty technique. Status post vertebral body augmentation for painful pathologic compression fracture at T8 using the vertebroplasty technique.      12/09/2012 - 06/10/2013 Anti-estrogen oral therapy    Fareston      04/22/2013 PET scan    Hypermetabolic left upper lobe nodule, possibly metastatic. The possibility of primary bronchogenic carcinoma is also considered. Residual hypermetabolic metastatic disease involving the liver and bones. Small left pleural effusion, stable or minimally increased from 03/07/2013.      06/11/2013 - 01/08/2014 Chemotherapy    Abraxane      01/31/2014 PET scan    Progressive metastatic lymphadenopathy within the right supraclavicular region and mediastinum.New hypermetabolic left adrenal metastasis. Mixed response of hypermetabolic liver metastases. Mild decrease in hypermetabolic activity associated with 1 cm left upper lobe pulmonary nodule. Diffuse bone metastases, with several in the thorax showing increased hypermetabolic activity consistent with mild progression of bone metastases.      02/01/2014 - 05/06/2015 Chemotherapy    XELODA 750 mg/m2 7 on/7 off       06/11/2014 PET scan    Partial metabolic response. Improving mediastinal lymphadenopathy, as above. Improving left adrenal metastasis. Prior hepatic metastasis is no longer evident.Improving multifocal osseous metastases, as above      05/04/2015 PET scan    Dominant finding is new widespread skeletal metastasis involvingthe axillary and appendicular skeleton. New lesions are not  discretely seen on the CT but recognized as new metabolic activity. New LEFT adrenal gland hypermetabolic metastasis. Foci of metabolic activity in the LEFT lung are felt to relate to atelectasis and/or infection. No clear evidence of pulmonary metastasis Moderate pleural LEFT pleural which could be sampled for cytology if clinically relevant.LEFT adrenal metastasis is actually an enlarged LEFT periaortic lymph node. This lymph node was enlarged and hypermetabolic to a greater degree FDG PET 01/31/2014 and subsequently improved and now has recurred on current exam  (05/04/2015) as a hypermetabolic enlarged LEFT periaortic lymph node.      05/04/2015 Progression    PET with progression of disease      05/06/2015 Treatment Plan Change    Megace      08/14/2015 PET scan    Suspect new hypermetabolic metastasis in posterior right hepatic lobe. Consider abdomen MRI without and with contrast for further evaluation. No significant change in probable malignant left pleural effusion and left lung atelectasis.No significant change in diffuse osseous metastatic disease. Stable hypermetabolic left abdominal paraaortic lymph node, consistent with metastatic disease      09/09/2015 - 03/07/2016 Chemotherapy    IBRANCE/FASLODEX . Ibrance at 100 mg secondary to grade 3 neutropenia      03/03/2016 PET scan    Interval overall progression of disease. Interval development of new and progression of pre-existing lesions in the liver, hypermetabolic on PET imaging, consistent with metastatic disease. Persistent hypermetabolic patchy opacity in the anterior left upper lobe with persistent chronic left pleural effusion. Diffuse bony metastases again noted with interval progression ofFDG uptake associated with the index bone lesions.      03/03/2016 Progression    PET demonstrates progression of disease.      03/11/2016 -  Chemotherapy    Halaven D1, 8 every 21 days and with Neupogen support.         HISTORY OF PRESENTING ILLNESS:   Debbie Reyes is a wonderful 75 y.o. female who has been referred to [place] by Dr .[name], MD Dr Tressie Stalker for continued management of metastatic breast cancer. She presents today for ongoing care.   Debbie Reyes is unaccompanied and presents in the treatment chair. She feels good today.   She states that she is trying to eat better. She has gained some weight. Family was here visiting recently and she notes that she had a great time.   She has requested a refill of hydrocodone.   She states that there is no difference in her energy  levels. She has "good and bad days".   She is up to date on her flu shot. She needs her pneumonia shot. Her last one she believes was 10 years ago.   Her last imaging was in August.   Patient is struggling with time of year of the loss of her husband coming up next month. She is tearful, but is happy that he is no longer in pain. They had a balloon release this past Monday for him.   She denies any abdominal pain or diarrhea. No fever or chills. No new pain.    MEDICAL HISTORY:  Past Medical History:  Diagnosis Date  . Bone metastases ([place]) 12/29/2015  . Cancer ([place])   . Low serum vitamin B12 03/18/2016  . Metastatic breast cancer ([place]) 12/29/2015  Depression   anxiety   SURGICAL HISTORY: L1 bone metastases biopsy  Axillary lymph node biopsy   SOCIAL HISTORY: Social History   Social History  . Marital status: Widowed  Spouse name: N/A  . Number of children: N/A  . Years of education: N/A   Occupational History  . Not on file.   Social History Main Topics  . Smoking status: Never Smoker  . Smokeless tobacco: Never Used  . Alcohol use No  . Drug use: No  . Sexual activity: No   Other Topics Concern  . Not on file   Social History Narrative  . No narrative on file    FAMILY HISTORY: Patient denies family history of breast cancer, ovarian cancer or uterine cancer . Denies any family history of blood clotting or bleeding disorders   ALLERGIES:  has No Known Allergies.  MEDICATIONS:  Current Outpatient Prescriptions  Medication Sig Dispense Refill  . aspirin EC 81 MG tablet Take 81 mg by mouth daily.    Debbie Reyes Kitchen denosumab (XGEVA) 120 MG/1.7ML SOLN Inject 120 mg into the skin once. Pt took on 10/13/11    . EriBULin Mesylate (HALAVEN IV) Inject into the vein. Day 1 day 8, every 21 days    . HYDROcodone-acetaminophen (NORCO/VICODIN) 5-325 MG tablet Take 1 tablet by mouth every 6 (six) hours as needed. For pain 100 tablet 0  . lidocaine-prilocaine (EMLA) cream Apply  topically as needed. 1 each 2  . LORazepam (ATIVAN) 1 MG tablet Take 1 mg by mouth at bedtime as needed. For sleep    . megestrol (MEGACE) 400 MG/10ML suspension Take 10 mLs (400 mg total) by mouth daily. 240 mL 1  . Melatonin 1 MG CAPS Take by mouth at bedtime as needed.    . ondansetron (ZOFRAN) 8 MG tablet Take 1 tablet (8 mg total) by mouth 2 (two) times daily as needed (Nausea or vomiting). 30 tablet 1  . PARoxetine (PAXIL) 20 MG tablet Take 1 tablet (20 mg total) by mouth daily. 30 tablet 2  . prochlorperazine (COMPAZINE) 10 MG tablet Take 1 tablet (10 mg total) by mouth every 6 (six) hours as needed (Nausea or vomiting). 30 tablet 1  . propranolol (INDERAL) 10 MG tablet Take 1 tablet (10 mg total) by mouth 2 (two) times daily. 60 tablet 3   No current facility-administered medications for this visit.     REVIEW OF SYSTEMS:   Review of Systems  Constitutional: Negative.        Has gained some weight Good and bad days with energy levels  HENT: Negative.   Eyes: Negative.   Respiratory: Negative.   Cardiovascular: Negative.   Gastrointestinal: Negative.  Negative for abdominal pain and diarrhea.  Genitourinary: Negative.   Musculoskeletal: Negative.   Skin: Negative.   Neurological: Negative.   Endo/Heme/Allergies: Negative.   Psychiatric/Behavioral: Negative.   All other systems reviewed and are negative. 14 point review of systems was performed and is negative except as detailed under history of present illness and above    PHYSICAL EXAMINATION: ECOG PERFORMANCE STATUS: 1 - Symptomatic but completely ambulatory   Vitals with BMI 05/13/2016  Height   Weight 101 lbs 13 oz  BMI   Systolic 92  Diastolic 49  Pulse 85  Respirations 18    Physical Exam  Constitutional: She is oriented to person, place, and time and well-developed, well-nourished, and in no distress.  Weight gain (up 1.8 lbs)  HENT:  Head: Normocephalic and atraumatic.  Eyes: EOM are normal. Pupils  are equal, round, and reactive to light.  Neck: Normal range of motion. Neck supple.  Cardiovascular: Normal rate, regular rhythm and normal heart sounds.   Pulmonary/Chest: Effort normal  and breath sounds normal.  Abdominal: Soft. Bowel sounds are normal.  Musculoskeletal: Normal range of motion.  Neurological: She is alert and oriented to person, place, and time. Gait normal.  Skin: Skin is warm and dry.  Nursing note and vitals reviewed.   LABORATORY DATA:  I have reviewed the data as listed Results for SAANYA, ZIESKE (MRN [id number]) as of 05/13/2016 08:55 Results for SHIARA, MCGOUGH (MRN [id number]) as of 05/16/2016 08:37  Ref. Range 05/13/2016 08:44  Sodium Latest Ref Range: 135 - 145 mmol/L 134 (L)  Potassium Latest Ref Range: 3.5 - 5.1 mmol/L 3.6  Chloride Latest Ref Range: 101 - 111 mmol/L 101  CO2 Latest Ref Range: 22 - 32 mmol/L 25  BUN Latest Ref Range: 6 - 20 mg/dL 23 (H)  Creatinine Latest Ref Range: 0.44 - 1.00 mg/dL 0.89  Calcium Latest Ref Range: 8.9 - 10.3 mg/dL 9.0  EGFR (Non-African Amer.) Latest Ref Range: >60 mL/min >60  EGFR (African American) Latest Ref Range: >60 mL/min >60  Glucose Latest Ref Range: 65 - 99 mg/dL 122 (H)  Anion gap Latest Ref Range: 5 - 15  8  Alkaline Phosphatase Latest Ref Range: 38 - 126 U/L 61  Albumin Latest Ref Range: 3.5 - 5.0 g/dL 3.7  AST Latest Ref Range: 15 - 41 U/L 26  ALT Latest Ref Range: 14 - 54 U/L 11 (L)  Total Protein Latest Ref Range: 6.5 - 8.1 g/dL 6.8  Total Bilirubin Latest Ref Range: 0.3 - 1.2 mg/dL 0.6  WBC Latest Ref Range: 4.0 - 10.5 K/uL 10.1  RBC Latest Ref Range: 3.87 - 5.11 MIL/uL 3.72 (L)  Hemoglobin Latest Ref Range: 12.0 - 15.0 g/dL 11.6 (L)  HCT Latest Ref Range: 36.0 - 46.0 % 36.6  MCV Latest Ref Range: 78.0 - 100.0 fL 98.4  MCH Latest Ref Range: 26.0 - 34.0 pg 31.2  MCHC Latest Ref Range: 30.0 - 36.0 g/dL 31.7  RDW Latest Ref Range: 11.5 - 15.5 % 19.1 (H)  Platelets Latest Ref Range: 150 - 400 K/uL 215   Neutrophils Latest Units: % 77  Lymphocytes Latest Units: % 10  Monocytes Relative Latest Units: % 12  Eosinophil Latest Units: % 0  Basophil Latest Units: % 1  NEUT# Latest Ref Range: 1.7 - 7.7 K/uL 7.8 (H)  Lymphocyte # Latest Ref Range: 0.7 - 4.0 K/uL 1.0  Monocyte # Latest Ref Range: 0.1 - 1.0 K/uL 1.2 (H)  Eosinophils Absolute Latest Ref Range: 0.0 - 0.7 K/uL 0.0  Basophils Absolute Latest Ref Range: 0.0 - 0.1 K/uL 0.1   Results for SHIRLINE, KENDLE (MRN [id number]) as of 05/16/2016 08:37  Ref. Range 03/16/2016 11:01 03/18/2016 13:42 04/01/2016 13:50 04/04/2016 14:11 04/04/2016 14:11 04/04/2016 14:11 04/07/2016 08:50 04/22/2016 13:46  CA 15-3 Latest Ref Range: 0.0 - 25.0 U/mL 681.6 (H)  515.3 (H)     348.3 (H)  CA 27.29 Latest Ref Range: 0.0 - 38.6 U/mL 833.8 (H)  655.0 (H)     507.7 (H)        RADIOGRAPHIC STUDIES: I have personally reviewed the radiological images as listed and agreed with the findings in the report. Study Result   CLINICAL DATA:  Subsequent treatment strategy for breast cancer.  EXAM: NUCLEAR MEDICINE PET SKULL BASE TO THIGH  TECHNIQUE: 5.3 mCi F-18 FDG was injected intravenously. Full-ring PET imaging was performed from the skull base to thigh after the radiotracer. CT data was obtained and used for attenuation correction and anatomic localization.  FASTING BLOOD GLUCOSE:  Value: 106 mg/dl  COMPARISON:  12/15/2015.  FINDINGS: NECK  Increased uptake in the left sternocleidomastoid muscle, likely related to patient motion.  CHEST  The nodular density seen previously in the anterior left upper lobe (image 35 series 8 today) measures 6 mm today with SUV max = 6.3 compared to 6.0 previously. The bandlike linear atelectasis scarring in the anterior left upper lobe is stable without substantial hypermetabolism. Chronic left pleural effusion with pleural thickening again noted. There is activity in the posterior aspect of the collapse/  consolidation at the left lung base, decreased slightly in the interval.  Small right pleural effusion is stable. Right Port-A-Cath tip projects at the SVC/RA junction.  ABDOMEN/PELVIS  Interval development of increasing and new hypermetabolic metastases in the liver. 17 mm hypermetabolic lesion in the tip of the lateral segment left liver was 5 mm previously. SUV max = 8.0 on today's exam.  6 mm hypermetabolic lesions seen in the medial right liver on the previous study has progressed 17 mm today (image 91 series 4) with SUV max = 6.0.  13 mm hypodense lesion seen previously in the inferior right hepatic lobe is stable. As before, this lesion is not substantially hypermetabolic.  There is abdominal aortic atherosclerosis without aneurysm.  SKELETON  Multiple hypermetabolic bony metastases are evident.  Sternal lesion measured previously has SUV max = 8.1 today compared to 6.4 previously.  Mid thoracic vertebral body lesion with SUV max = 7.4 today compared to 5.3 previously.  Index lesion in the right iliac crest is 14.5 today compared to 10.3 previously.  IMPRESSION: 1. Interval overall progression of disease. 2. Interval development of new and progression of pre-existing lesions in the liver, hypermetabolic on PET imaging, consistent with metastatic disease. 3. Persistent hypermetabolic patchy opacity in the anterior left upper lobe with persistent chronic left pleural effusion. 4. Diffuse bony metastases again noted with interval progression of FDG uptake associated with the index bone lesions.   Electronically Signed   By: Misty Stanley M.D.   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 Tumor  markers are falling, weight is stable to improved. Energy is baseline.  She is to continue on B12 and folate.   She is due for repeat restaging and I will order a PET scan for sometime after Thanksgiving.   I have written her a refill for her hydrocodone.   I have ordered her for a shot of Prevnar and then in 8 weeks she will receive Pneumovax.   She will return for a follow up on 06/10/2016.   Orders Placed This Encounter  Procedures  . NM PET Image Restag (PS) Skull Base To Thigh    Standing Status:   Future    Standing Expiration Date:   05/13/2017    Order Specific Question:   Reason for Exam (SYMPTOM  OR DIAGNOSIS REQUIRED)    Answer:   restaging breast cancer, stage IV    Order Specific Question:   Preferred imaging location?    Answer:   [facility]    Order Specific Question:   If indicated for the ordered procedure, I authorize the administration of a radiopharmaceutical per Radiology protocol    Answer:   Yes  . CBC with Differential  Standing Status:   Future    Standing Expiration Date:   05/13/2017  . Comprehensive metabolic panel    Standing Status:   Future    Standing Expiration Date:   05/13/2017  . Cancer antigen 15-3    Standing Status:   Future    Standing Expiration Date:   05/13/2017  . Cancer antigen 27.29    Standing Status:   Future    Standing Expiration Date:   05/13/2017    All of the patients questions were answered to her apparent satisfaction. The patient knows to call the clinic with any problems, questions or concerns.  This document serves as a record of services personally performed by Ancil Linsey, MD. It was created on her behalf by Martinique Casey, a trained medical scribe. The creation of this record is based on the scribe's personal observations and the provider's statements to them. This document has been checked and approved by the attending provider.  I have reviewed the above documentation for accuracy and completeness and I  agree with the above.  Kelby Fam. Whitney Muse, MD

## 2016-05-13 NOTE — Progress Notes (Signed)
Debbie Reyes [place] tolerated chemo tx,Vit B12 injection and Prevnar vaccine well without complaints or incident.Labs reviewed prior to administration of chemo. HGB 11.6 Aranesp held. VSS upon discharge. Pt discharged self ambulatory in satisfactory condition

## 2016-05-15 ENCOUNTER — Encounter (HOSPITAL_COMMUNITY): Payer: Self-pay | Admitting: Hematology & Oncology

## 2016-05-16 ENCOUNTER — Encounter (HOSPITAL_COMMUNITY): Payer: Self-pay | Admitting: Hematology & Oncology

## 2016-05-16 ENCOUNTER — Encounter (HOSPITAL_COMMUNITY): Payer: Self-pay

## 2016-05-16 ENCOUNTER — Encounter (HOSPITAL_BASED_OUTPATIENT_CLINIC_OR_DEPARTMENT_OTHER): Payer: Medicare Other

## 2016-05-16 VITALS — BP 97/58 | HR 95 | Temp 97.6°F | Resp 16

## 2016-05-16 DIAGNOSIS — D702 Other drug-induced agranulocytosis: Secondary | ICD-10-CM | POA: Diagnosis present

## 2016-05-16 DIAGNOSIS — C50919 Malignant neoplasm of unspecified site of unspecified female breast: Secondary | ICD-10-CM | POA: Diagnosis not present

## 2016-05-16 MED ORDER — FILGRASTIM 300 MCG/0.5ML IJ SOSY
300.0000 ug | PREFILLED_SYRINGE | Freq: Once | INTRAMUSCULAR | Status: AC
  Administered 2016-05-16: 300 ug via SUBCUTANEOUS
  Filled 2016-05-16: qty 0.5

## 2016-05-16 NOTE — Progress Notes (Signed)
Debbie Reyes presents today for injection per MD orders. Neupogen 300mg  administered SQ in right Abdomen. Administration without incident. Patient tolerated well.  Vitals stable and discharged home from clinic ambulatory. Follow up as scheduled.

## 2016-05-16 NOTE — Patient Instructions (Signed)
[place] at [facility] Discharge Instructions  RECOMMENDATIONS MADE BY THE CONSULTANT AND ANY TEST RESULTS WILL BE SENT TO YOUR REFERRING PHYSICIAN.  Neupagen given Follow up as scheduled.  Thank you for choosing [place] at [facility] to provide your oncology and hematology care.  To afford each patient quality time with our provider, please arrive at least 15 minutes before your scheduled appointment time.   Beginning January 23rd 2017 lab work for the [facility] will be done in the  Main lab at [facility] on 1st floor. If you have a lab appointment with the [facility] please come in thru the  Main Entrance and check in at the main information desk  You need to re-schedule your appointment should you arrive 10 or more minutes late.  We strive to give you quality time with our providers, and arriving late affects you and other patients whose appointments are after yours.  Also, if you no show three or more times for appointments you may be dismissed from the clinic at the providers discretion.     Again, thank you for choosing [facility].  Our hope is that these requests will decrease the amount of time that you wait before being seen by our physicians.       _____________________________________________________________  Should you have questions after your visit to [facility], please contact our office at [phone] between the hours of 8:30 a.m. and 4:30 p.m.  Voicemails left after 4:30 p.m. will not be returned until the following business day.  For prescription refill requests, have your pharmacy contact our office.         Resources For Cancer Patients and their Caregivers ? American Cancer Society: Can assist with transportation, wigs, general needs, runs Look Good Feel Better.        401-488-1319 ? Cancer Care: Provides financial assistance, online support groups, medication/co-pay  assistance.  1-800-813-HOPE 563-751-5088) ? [facility] Assists [place] cancer patients and their families through emotional , educational and financial support.  734-124-5789 ? [place] DSS Where to apply for food stamps, Medicaid and utility assistance. 905-817-3800 ? RCATS: Transportation to medical appointments. 7274069707 ? Social Security Administration: May apply for disability if have a Stage IV cancer. 912 529 9420 820-851-5579 ? [employer], Disability and Transit Services: Assists with nutrition, care and transit needs. [place] Support Programs: @10RELATIVEDAYS @ > Cancer Support Group  2nd Tuesday of the month 1pm-2pm, Journey Room  > Creative Journey  3rd Tuesday of the month 1130am-1pm, Journey Room  > Look Good Feel Better  1st Wednesday of the month 10am-12 noon, Journey Room (Call [place] to register [phone])

## 2016-05-17 ENCOUNTER — Encounter (HOSPITAL_COMMUNITY): Payer: Medicare Other | Attending: Hematology & Oncology

## 2016-05-17 VITALS — BP 99/71 | HR 93 | Temp 97.8°F | Resp 16

## 2016-05-17 DIAGNOSIS — D702 Other drug-induced agranulocytosis: Secondary | ICD-10-CM

## 2016-05-17 DIAGNOSIS — C50919 Malignant neoplasm of unspecified site of unspecified female breast: Secondary | ICD-10-CM

## 2016-05-17 DIAGNOSIS — D701 Agranulocytosis secondary to cancer chemotherapy: Secondary | ICD-10-CM

## 2016-05-17 MED ORDER — FILGRASTIM 300 MCG/0.5ML IJ SOSY
300.0000 ug | PREFILLED_SYRINGE | Freq: Once | INTRAMUSCULAR | Status: AC
  Administered 2016-05-17: 300 ug via SUBCUTANEOUS
  Filled 2016-05-17: qty 0.5

## 2016-05-17 NOTE — Progress Notes (Signed)
Debbie Reyes presents today for injection per MD orders. Neupogen 300 mcg administered SQ in left Abdomen. Administration without incident. Patient tolerated well.

## 2016-05-17 NOTE — Patient Instructions (Signed)
[facility] at [facility] Discharge Instructions  RECOMMENDATIONS MADE BY THE CONSULTANT AND ANY TEST RESULTS WILL BE SENT TO YOUR REFERRING PHYSICIAN.  Neupogen 300 mcg injection given as ordered. Return as scheduled.  Thank you for choosing [facility] at [facility] to provide your oncology and hematology care.  To afford each patient quality time with our provider, please arrive at least 15 minutes before your scheduled appointment time.   Beginning January 23rd 2017 lab work for the Cancer Center will be done in the  Main lab at [place] on 1st floor. If you have a lab appointment with the Cancer Center please come in thru the  Main Entrance and check in at the main information desk  You need to re-schedule your appointment should you arrive 10 or more minutes late.  We strive to give you quality time with our providers, and arriving late affects you and other patients whose appointments are after yours.  Also, if you no show three or more times for appointments you may be dismissed from the clinic at the providers discretion.     Again, thank you for choosing [facility].  Our hope is that these requests will decrease the amount of time that you wait before being seen by our physicians.       _____________________________________________________________  Should you have questions after your visit to [facility], please contact our office at [phone] between the hours of 8:30 a.m. and 4:30 p.m.  Voicemails left after 4:30 p.m. will not be returned until the following business day.  For prescription refill requests, have your pharmacy contact our office.         Resources For Cancer Patients and their Caregivers ? American Cancer Society: Can assist with transportation, wigs, general needs, runs Look Good Feel Better.        1-888-227-6333 ? Cancer Care: Provides financial assistance, online support  groups, medication/co-pay assistance.  1-800-813-HOPE (4673) ? [facility] Assists [place] cancer patients and their families through emotional , educational and financial support.  336-427-4357 ? [place] DSS Where to apply for food stamps, Medicaid and utility assistance. 336-342-1394 ? RCATS: Transportation to medical appointments. 336-347-2287 ? Social Security Administration: May apply for disability if have a Stage IV cancer. 336-342-7796 1-800-772-1213 ? [place] Aging, Disability and Transit Services: Assists with nutrition, care and transit needs. 336-349-2343  Cancer Center Support Programs: @10RELATIVEDAYS@ > Cancer Support Group  2nd Tuesday of the month 1pm-2pm, Journey Room  > Creative Journey  3rd Tuesday of the month 1130am-1pm, Journey Room  > Look Good Feel Better  1st Wednesday of the month 10am-12 noon, Journey Room (Call American Cancer Society to register 1-800-395-5775)   

## 2016-05-20 ENCOUNTER — Encounter (HOSPITAL_COMMUNITY): Payer: Self-pay

## 2016-05-20 ENCOUNTER — Encounter (HOSPITAL_BASED_OUTPATIENT_CLINIC_OR_DEPARTMENT_OTHER): Payer: Medicare Other

## 2016-05-20 VITALS — BP 107/66 | HR 78 | Temp 97.8°F | Resp 18 | Wt 101.0 lb

## 2016-05-20 DIAGNOSIS — C7951 Secondary malignant neoplasm of bone: Secondary | ICD-10-CM | POA: Diagnosis not present

## 2016-05-20 DIAGNOSIS — C50919 Malignant neoplasm of unspecified site of unspecified female breast: Secondary | ICD-10-CM

## 2016-05-20 DIAGNOSIS — C799 Secondary malignant neoplasm of unspecified site: Secondary | ICD-10-CM | POA: Diagnosis not present

## 2016-05-20 DIAGNOSIS — C50912 Malignant neoplasm of unspecified site of left female breast: Secondary | ICD-10-CM | POA: Diagnosis not present

## 2016-05-20 DIAGNOSIS — D702 Other drug-induced agranulocytosis: Secondary | ICD-10-CM

## 2016-05-20 DIAGNOSIS — Z5111 Encounter for antineoplastic chemotherapy: Secondary | ICD-10-CM

## 2016-05-20 LAB — CBC WITH DIFFERENTIAL/PLATELET
BASOS ABS: 0 10*3/uL (ref 0.0–0.1)
Basophils Relative: 0 %
EOS ABS: 0 10*3/uL (ref 0.0–0.7)
Eosinophils Relative: 0 %
HCT: 33 % — ABNORMAL LOW (ref 36.0–46.0)
HEMOGLOBIN: 10.5 g/dL — AB (ref 12.0–15.0)
LYMPHS PCT: 10 %
Lymphs Abs: 0.8 10*3/uL (ref 0.7–4.0)
MCH: 31.4 pg (ref 26.0–34.0)
MCHC: 31.8 g/dL (ref 30.0–36.0)
MCV: 98.8 fL (ref 78.0–100.0)
Monocytes Absolute: 1.7 10*3/uL — ABNORMAL HIGH (ref 0.1–1.0)
Monocytes Relative: 22 %
NEUTROS ABS: 5.1 10*3/uL (ref 1.7–7.7)
NEUTROS PCT: 68 %
Platelets: 209 10*3/uL (ref 150–400)
RBC: 3.34 MIL/uL — AB (ref 3.87–5.11)
RDW: 19.1 % — ABNORMAL HIGH (ref 11.5–15.5)
WBC Morphology: INCREASED
WBC: 7.6 10*3/uL (ref 4.0–10.5)

## 2016-05-20 LAB — COMPREHENSIVE METABOLIC PANEL
ALBUMIN: 3.7 g/dL (ref 3.5–5.0)
ALK PHOS: 66 U/L (ref 38–126)
ALT: 13 U/L — ABNORMAL LOW (ref 14–54)
ANION GAP: 7 (ref 5–15)
AST: 27 U/L (ref 15–41)
BUN: 15 mg/dL (ref 6–20)
CALCIUM: 8.8 mg/dL — AB (ref 8.9–10.3)
CO2: 26 mmol/L (ref 22–32)
Chloride: 103 mmol/L (ref 101–111)
Creatinine, Ser: 0.78 mg/dL (ref 0.44–1.00)
GFR calc Af Amer: >60 mL/min (ref >60)
GFR calc non Af Amer: >60 mL/min (ref >60)
GLUCOSE: 104 mg/dL — AB (ref 65–99)
POTASSIUM: 3.5 mmol/L (ref 3.5–5.1)
SODIUM: 136 mmol/L (ref 135–145)
Total Bilirubin: 0.7 mg/dL (ref 0.3–1.2)
Total Protein: 6.7 g/dL (ref 6.5–8.1)

## 2016-05-20 MED ORDER — HEPARIN SOD (PORK) LOCK FLUSH 100 UNIT/ML IV SOLN
500.0000 [IU] | Freq: Once | INTRAVENOUS | Status: AC | PRN
  Administered 2016-05-20: 500 [IU]
  Filled 2016-05-20: qty 5

## 2016-05-20 MED ORDER — SODIUM CHLORIDE 0.9 % IV SOLN
Freq: Once | INTRAVENOUS | Status: AC
  Administered 2016-05-20: 13:00:00 via INTRAVENOUS

## 2016-05-20 MED ORDER — PROCHLORPERAZINE MALEATE 10 MG PO TABS
10.0000 mg | ORAL_TABLET | Freq: Once | ORAL | Status: AC
  Administered 2016-05-20: 10 mg via ORAL
  Filled 2016-05-20: qty 1

## 2016-05-20 MED ORDER — SODIUM CHLORIDE 0.9 % IV SOLN
1.4000 mg/m2 | Freq: Once | INTRAVENOUS | Status: AC
  Administered 2016-05-20: 2 mg via INTRAVENOUS
  Filled 2016-05-20: qty 2

## 2016-05-20 MED ORDER — ONDANSETRON HCL 4 MG PO TABS
8.0000 mg | ORAL_TABLET | Freq: Once | ORAL | Status: AC
  Administered 2016-05-20: 8 mg via ORAL
  Filled 2016-05-20: qty 2

## 2016-05-20 NOTE — Patient Instructions (Signed)
[facility] Discharge Instructions for Patients Receiving Chemotherapy   Beginning January 23rd 2017 lab work for the [facility] will be done in the  Main lab at [facility] on 1st floor. If you have a lab appointment with the [facility] please come in thru the  Main Entrance and check in at the main information desk   Today you received the following chemotherapy agents:  halaven  To help prevent nausea and vomiting after your treatment, we encourage you to take your nausea medication as prescribed.  If you develop nausea and vomiting, or diarrhea that is not controlled by your medication, call the clinic.  The clinic phone number is [phone]. Office hours are Monday-Friday 8:30am-5:00pm.  BELOW ARE SYMPTOMS THAT SHOULD BE REPORTED IMMEDIATELY:  *FEVER GREATER THAN 101.0 F  *CHILLS WITH OR WITHOUT FEVER  NAUSEA AND VOMITING THAT IS NOT CONTROLLED WITH YOUR NAUSEA MEDICATION  *UNUSUAL SHORTNESS OF BREATH  *UNUSUAL BRUISING OR BLEEDING  TENDERNESS IN MOUTH AND THROAT WITH OR WITHOUT PRESENCE OF ULCERS  *URINARY PROBLEMS  *BOWEL PROBLEMS  UNUSUAL RASH Items with * indicate a potential emergency and should be followed up as soon as possible. If you have an emergency after office hours please contact your primary care physician or go to the nearest emergency department.  Please call the clinic during office hours if you have any questions or concerns.   You may also contact the Patient Navigator at [phone] should you have any questions or need assistance in obtaining follow up care.      Resources For Cancer Patients and their Caregivers ? American Cancer Society: Can assist with transportation, wigs, general needs, runs Look Good Feel Better.        ([phone] ? Cancer Care: Provides financial assistance, online support groups, medication/co-pay assistance.  1-800-813-HOPE 986-681-0995) ? [place] Assists  [place] cancer patients and their families through emotional , educational and financial support.  2195649174 ? [place] DSS Where to apply for food stamps, Medicaid and utility assistance. (817) 218-6437 ? RCATS: Transportation to medical appointments. (704) 183-4451 ? Social Security Administration: May apply for disability if have a Stage IV cancer. 7016303911 671-268-9688 ? [employer], Disability and Transit Services: Assists with nutrition, care and transit needs. [phone]

## 2016-05-20 NOTE — Progress Notes (Signed)
Tolerated infusion w/o adverse reaction.  Alert, in no distress.  VSS.  Discharged ambulatory.  

## 2016-05-21 LAB — CANCER ANTIGEN 15-3: CAN 15 3: 227.3 U/mL — AB (ref 0.0–25.0)

## 2016-05-21 LAB — CANCER ANTIGEN 27.29: CA 27.29: 323.1 U/mL — AB (ref 0.0–38.6)

## 2016-05-23 ENCOUNTER — Encounter (HOSPITAL_COMMUNITY): Payer: Self-pay

## 2016-05-23 ENCOUNTER — Encounter (HOSPITAL_COMMUNITY): Payer: Self-pay | Attending: Hematology & Oncology

## 2016-05-23 VITALS — BP 110/61 | HR 74 | Temp 98.0°F | Resp 18

## 2016-05-23 DIAGNOSIS — D701 Agranulocytosis secondary to cancer chemotherapy: Secondary | ICD-10-CM

## 2016-05-23 DIAGNOSIS — D702 Other drug-induced agranulocytosis: Secondary | ICD-10-CM

## 2016-05-23 DIAGNOSIS — C50919 Malignant neoplasm of unspecified site of unspecified female breast: Secondary | ICD-10-CM

## 2016-05-23 MED ORDER — FILGRASTIM 300 MCG/0.5ML IJ SOSY
300.0000 ug | PREFILLED_SYRINGE | Freq: Once | INTRAMUSCULAR | Status: AC
  Administered 2016-05-23: 300 ug via SUBCUTANEOUS
  Filled 2016-05-23: qty 0.5

## 2016-05-23 NOTE — Progress Notes (Signed)
Pt discharged ambulatory alert and in no distress.

## 2016-05-23 NOTE — Progress Notes (Signed)
Debbie Reyes presents today for injection per the provider's orders.  Neupogen administration without incident; see MAR for injection details.  Patient tolerated procedure well and without incident.  No questions or complaints noted at this time.

## 2016-05-23 NOTE — Patient Instructions (Signed)
[place] at [facility] Discharge Instructions  RECOMMENDATIONS MADE BY THE CONSULTANT AND ANY TEST RESULTS WILL BE SENT TO YOUR REFERRING PHYSICIAN.  Neupogen injection today. Return as scheduled tomorrow for Neupogen. Return as scheduled for Xgeva injection. Return as scheduled for chemotherapy.   Thank you for choosing [place] at [facility] to provide your oncology and hematology care.  To afford each patient quality time with our provider, please arrive at least 15 minutes before your scheduled appointment time.   Beginning January 23rd 2017 lab work for the [facility] will be done in the  Main lab at [facility] on 1st floor. If you have a lab appointment with the [place] please come in thru the  Main Entrance and check in at the main information desk  You need to re-schedule your appointment should you arrive 10 or more minutes late.  We strive to give you quality time with our providers, and arriving late affects you and other patients whose appointments are after yours.  Also, if you no show three or more times for appointments you may be dismissed from the clinic at the providers discretion.     Again, thank you for choosing [facility].  Our hope is that these requests will decrease the amount of time that you wait before being seen by our physicians.       _____________________________________________________________  Should you have questions after your visit to [facility], please contact our office at [phone] between the hours of 8:30 a.m. and 4:30 p.m.  Voicemails left after 4:30 p.m. will not be returned until the following business day.  For prescription refill requests, have your pharmacy contact our office.         Resources For Cancer Patients and their Caregivers ? American Cancer Society: Can assist with transportation, wigs, general needs, runs Look Good Feel Better.         [phone] ? Cancer Care: Provides financial assistance, online support groups, medication/co-pay assistance.  1-800-813-HOPE 4253384364) ? [place] Assists [place] cancer patients and their families through emotional , educational and financial support.  470-635-2149 ? [place] DSS Where to apply for food stamps, Medicaid and utility assistance. 413-399-8489 ? RCATS: Transportation to medical appointments. ([phone] 353-1262 ? Social Security Administration: May apply for disability if have a Stage IV cancer. 661-442-2295 478-884-5187 ? [employer], Disability and Transit Services: Assists with nutrition, care and transit needs. [place] Support Programs: @10RELATIVEDAYS @ > Cancer Support Group  2nd Tuesday of the month 1pm-2pm, Journey Room  > Creative Journey  3rd Tuesday of the month 1130am-1pm, Journey Room  > Look Good Feel Better  1st Wednesday of the month 10am-12 noon, Journey Room (Call [place] to register [phone])

## 2016-05-24 ENCOUNTER — Encounter (HOSPITAL_BASED_OUTPATIENT_CLINIC_OR_DEPARTMENT_OTHER): Payer: Medicare Other

## 2016-05-24 ENCOUNTER — Encounter (HOSPITAL_COMMUNITY): Payer: Self-pay

## 2016-05-24 VITALS — BP 107/58 | HR 96 | Temp 97.3°F | Resp 18

## 2016-05-24 DIAGNOSIS — C50919 Malignant neoplasm of unspecified site of unspecified female breast: Secondary | ICD-10-CM

## 2016-05-24 DIAGNOSIS — D701 Agranulocytosis secondary to cancer chemotherapy: Secondary | ICD-10-CM | POA: Diagnosis present

## 2016-05-24 DIAGNOSIS — D702 Other drug-induced agranulocytosis: Secondary | ICD-10-CM

## 2016-05-24 MED ORDER — FILGRASTIM 300 MCG/0.5ML IJ SOSY
300.0000 ug | PREFILLED_SYRINGE | Freq: Once | INTRAMUSCULAR | Status: AC
  Administered 2016-05-24: 300 ug via SUBCUTANEOUS
  Filled 2016-05-24: qty 0.5

## 2016-05-24 NOTE — Progress Notes (Signed)
MACHENZIE WELTNER presents today for injection per MD orders. Neupogen 339mcg administered SQ in left Abdomen. Administration without incident. Patient tolerated well.  Vitals stable and discharged ambulatory from clinic. Follow up as scheduled.

## 2016-05-24 NOTE — Patient Instructions (Signed)
[facility] at [facility] Discharge Instructions  RECOMMENDATIONS MADE BY THE CONSULTANT AND ANY TEST RESULTS WILL BE SENT TO YOUR REFERRING PHYSICIAN.  Neupagen given today.  Follow up as scheduled.  Thank you for choosing  Cancer Center at [facility] to provide your oncology and hematology care.  To afford each patient quality time with our provider, please arrive at least 15 minutes before your scheduled appointment time.   Beginning January 23rd 2017 lab work for the Cancer Center will be done in the  Main lab at [place] on 1st floor. If you have a lab appointment with the Cancer Center please come in thru the  Main Entrance and check in at the main information desk  You need to re-schedule your appointment should you arrive 10 or more minutes late.  We strive to give you quality time with our providers, and arriving late affects you and other patients whose appointments are after yours.  Also, if you no show three or more times for appointments you may be dismissed from the clinic at the providers discretion.     Again, thank you for choosing [facility].  Our hope is that these requests will decrease the amount of time that you wait before being seen by our physicians.       _____________________________________________________________  Should you have questions after your visit to [facility], please contact our office at [phone] between the hours of 8:30 a.m. and 4:30 p.m.  Voicemails left after 4:30 p.m. will not be returned until the following business day.  For prescription refill requests, have your pharmacy contact our office.         Resources For Cancer Patients and their Caregivers ? American Cancer Society: Can assist with transportation, wigs, general needs, runs Look Good Feel Better.        1-888-227-6333 ? Cancer Care: Provides financial assistance, online support groups,  medication/co-pay assistance.  1-800-813-HOPE (4673) ? [facility] Assists [place] cancer patients and their families through emotional , educational and financial support.  336-427-4357 ? [place] DSS Where to apply for food stamps, Medicaid and utility assistance. 336-342-1394 ? RCATS: Transportation to medical appointments. 336-347-2287 ? Social Security Administration: May apply for disability if have a Stage IV cancer. 336-342-7796 1-800-772-1213 ? [place] [employer], Disability and Transit Services: Assists with nutrition, care and transit needs. 336-349-2343  Cancer Center Support Programs: @10RELATIVEDAYS@ > Cancer Support Group  2nd Tuesday of the month 1pm-2pm, Journey Room  > Creative Journey  3rd Tuesday of the month 1130am-1pm, Journey Room  > Look Good Feel Better  1st Wednesday of the month 10am-12 noon, Journey Room (Call [employer] to register [phone])   

## 2016-06-07 ENCOUNTER — Ambulatory Visit (HOSPITAL_COMMUNITY): Payer: Medicare Other

## 2016-06-07 ENCOUNTER — Encounter (HOSPITAL_BASED_OUTPATIENT_CLINIC_OR_DEPARTMENT_OTHER): Payer: Medicare Other

## 2016-06-07 ENCOUNTER — Encounter (HOSPITAL_COMMUNITY): Payer: Medicare Other

## 2016-06-07 ENCOUNTER — Other Ambulatory Visit (HOSPITAL_COMMUNITY): Payer: Medicare Other

## 2016-06-07 VITALS — BP 107/54 | HR 77 | Temp 97.4°F | Resp 16

## 2016-06-07 DIAGNOSIS — C50912 Malignant neoplasm of unspecified site of left female breast: Secondary | ICD-10-CM

## 2016-06-07 DIAGNOSIS — C50919 Malignant neoplasm of unspecified site of unspecified female breast: Secondary | ICD-10-CM | POA: Diagnosis not present

## 2016-06-07 DIAGNOSIS — C799 Secondary malignant neoplasm of unspecified site: Secondary | ICD-10-CM | POA: Diagnosis not present

## 2016-06-07 DIAGNOSIS — C7951 Secondary malignant neoplasm of bone: Secondary | ICD-10-CM | POA: Diagnosis present

## 2016-06-07 LAB — CBC WITH DIFFERENTIAL/PLATELET
Basophils Absolute: 0 10*3/uL (ref 0.0–0.1)
Basophils Relative: 0 %
Eosinophils Absolute: 0 10*3/uL (ref 0.0–0.7)
Eosinophils Relative: 0 %
HEMATOCRIT: 32.3 % — AB (ref 36.0–46.0)
HEMOGLOBIN: 10.4 g/dL — AB (ref 12.0–15.0)
LYMPHS ABS: 0.8 10*3/uL (ref 0.7–4.0)
LYMPHS PCT: 11 %
MCH: 31.6 pg (ref 26.0–34.0)
MCHC: 32.2 g/dL (ref 30.0–36.0)
MCV: 98.2 fL (ref 78.0–100.0)
MONO ABS: 1.3 10*3/uL — AB (ref 0.1–1.0)
MONOS PCT: 17 %
NEUTROS ABS: 5.4 10*3/uL (ref 1.7–7.7)
NEUTROS PCT: 72 %
Platelets: 215 10*3/uL (ref 150–400)
RBC: 3.29 MIL/uL — ABNORMAL LOW (ref 3.87–5.11)
RDW: 19 % — AB (ref 11.5–15.5)
WBC: 7.6 10*3/uL (ref 4.0–10.5)

## 2016-06-07 LAB — COMPREHENSIVE METABOLIC PANEL
ALBUMIN: 3.6 g/dL (ref 3.5–5.0)
ALK PHOS: 47 U/L (ref 38–126)
ALT: 7 U/L — ABNORMAL LOW (ref 14–54)
ANION GAP: 5 (ref 5–15)
AST: 18 U/L (ref 15–41)
BILIRUBIN TOTAL: 0.4 mg/dL (ref 0.3–1.2)
BUN: 22 mg/dL — AB (ref 6–20)
CALCIUM: 9.3 mg/dL (ref 8.9–10.3)
CO2: 25 mmol/L (ref 22–32)
Chloride: 105 mmol/L (ref 101–111)
Creatinine, Ser: 0.93 mg/dL (ref 0.44–1.00)
GFR calc Af Amer: >60 mL/min (ref >60)
GFR, EST NON AFRICAN AMERICAN: 59 mL/min — AB (ref >60)
GLUCOSE: 92 mg/dL (ref 65–99)
POTASSIUM: 3.7 mmol/L (ref 3.5–5.1)
Sodium: 135 mmol/L (ref 135–145)
TOTAL PROTEIN: 6.6 g/dL (ref 6.5–8.1)

## 2016-06-07 MED ORDER — DENOSUMAB 120 MG/1.7ML ~~LOC~~ SOLN
120.0000 mg | Freq: Once | SUBCUTANEOUS | Status: AC
  Administered 2016-06-07: 120 mg via SUBCUTANEOUS
  Filled 2016-06-07: qty 1.7

## 2016-06-07 NOTE — Patient Instructions (Signed)
[place] at [facility] Discharge Instructions  RECOMMENDATIONS MADE BY THE CONSULTANT AND ANY TEST RESULTS WILL BE SENT TO YOUR REFERRING PHYSICIAN.  Xgeva 120 mg injection given as ordered. Return as scheduled.  Thank you for choosing [place] at [facility] to provide your oncology and hematology care.  To afford each patient quality time with our provider, please arrive at least 15 minutes before your scheduled appointment time.   Beginning January 23rd 2017 lab work for the [facility] will be done in the  Main lab at [facility] on 1st floor. If you have a lab appointment with the [facility] please come in thru the  Main Entrance and check in at the main information desk  You need to re-schedule your appointment should you arrive 10 or more minutes late.  We strive to give you quality time with our providers, and arriving late affects you and other patients whose appointments are after yours.  Also, if you no show three or more times for appointments you may be dismissed from the clinic at the providers discretion.     Again, thank you for choosing [facility].  Our hope is that these requests will decrease the amount of time that you wait before being seen by our physicians.       _____________________________________________________________  Should you have questions after your visit to [facility], please contact our office at [phone] between the hours of 8:30 a.m. and 4:30 p.m.  Voicemails left after 4:30 p.m. will not be returned until the following business day.  For prescription refill requests, have your pharmacy contact our office.         Resources For Cancer Patients and their Caregivers ? American Cancer Society: Can assist with transportation, wigs, general needs, runs Look Good Feel Better.        570-390-4638 ? Cancer Care: Provides financial assistance, online support groups,  medication/co-pay assistance.  1-800-813-HOPE 4153759411) ? [place] Assists [place] cancer patients and their families through emotional , educational and financial support.  571-345-4115 ? [place] DSS Where to apply for food stamps, Medicaid and utility assistance. 786 763 7234 ? RCATS: Transportation to medical appointments. 646-506-6550 ? Social Security Administration: May apply for disability if have a Stage IV cancer. [phone] ? [employer], Disability and Transit Services: Assists with nutrition, care and transit needs. [place] Support Programs: @10RELATIVEDAYS @ > Cancer Support Group  2nd Tuesday of the month 1pm-2pm, Journey Room  > Creative Journey  3rd Tuesday of the month 1130am-1pm, Journey Room  > Look Good Feel Better  1st Wednesday of the month 10am-12 noon, Journey Room (Call [place] to register [phone])

## 2016-06-07 NOTE — Progress Notes (Signed)
Tammra J Arns presents today for injection per MD orders. Xgeva 120 mg administered SQ in right Abdomen. Administration without incident. Patient tolerated well.  

## 2016-06-10 ENCOUNTER — Encounter (HOSPITAL_COMMUNITY): Payer: Self-pay | Admitting: Hematology & Oncology

## 2016-06-10 ENCOUNTER — Encounter (HOSPITAL_BASED_OUTPATIENT_CLINIC_OR_DEPARTMENT_OTHER): Payer: Medicare Other

## 2016-06-10 ENCOUNTER — Encounter (HOSPITAL_COMMUNITY): Payer: Medicare Other | Attending: Hematology & Oncology | Admitting: Hematology & Oncology

## 2016-06-10 VITALS — BP 100/50 | HR 84 | Temp 97.9°F | Resp 18

## 2016-06-10 VITALS — BP 122/62 | HR 103 | Temp 97.9°F | Resp 20 | Wt 100.9 lb

## 2016-06-10 DIAGNOSIS — Z170 Estrogen receptor positive status [ER+]: Secondary | ICD-10-CM | POA: Diagnosis not present

## 2016-06-10 DIAGNOSIS — E538 Deficiency of other specified B group vitamins: Secondary | ICD-10-CM

## 2016-06-10 DIAGNOSIS — C50912 Malignant neoplasm of unspecified site of left female breast: Secondary | ICD-10-CM

## 2016-06-10 DIAGNOSIS — C7951 Secondary malignant neoplasm of bone: Secondary | ICD-10-CM

## 2016-06-10 DIAGNOSIS — Z5112 Encounter for antineoplastic immunotherapy: Secondary | ICD-10-CM

## 2016-06-10 DIAGNOSIS — D6481 Anemia due to antineoplastic chemotherapy: Secondary | ICD-10-CM

## 2016-06-10 DIAGNOSIS — T451X5A Adverse effect of antineoplastic and immunosuppressive drugs, initial encounter: Secondary | ICD-10-CM

## 2016-06-10 DIAGNOSIS — C787 Secondary malignant neoplasm of liver and intrahepatic bile duct: Secondary | ICD-10-CM | POA: Diagnosis not present

## 2016-06-10 DIAGNOSIS — Z452 Encounter for adjustment and management of vascular access device: Secondary | ICD-10-CM | POA: Diagnosis present

## 2016-06-10 DIAGNOSIS — C50612 Malignant neoplasm of axillary tail of left female breast: Secondary | ICD-10-CM

## 2016-06-10 DIAGNOSIS — C50919 Malignant neoplasm of unspecified site of unspecified female breast: Secondary | ICD-10-CM

## 2016-06-10 DIAGNOSIS — Z95828 Presence of other vascular implants and grafts: Secondary | ICD-10-CM

## 2016-06-10 DIAGNOSIS — D701 Agranulocytosis secondary to cancer chemotherapy: Secondary | ICD-10-CM | POA: Diagnosis not present

## 2016-06-10 DIAGNOSIS — D702 Other drug-induced agranulocytosis: Secondary | ICD-10-CM

## 2016-06-10 DIAGNOSIS — C799 Secondary malignant neoplasm of unspecified site: Secondary | ICD-10-CM | POA: Insufficient documentation

## 2016-06-10 MED ORDER — HYDROCODONE-ACETAMINOPHEN 5-325 MG PO TABS: 1.0000 | ORAL_TABLET | Freq: Four times a day (QID) | ORAL | 0 refills | Status: DC | PRN

## 2016-06-10 MED ORDER — HEPARIN SOD (PORK) LOCK FLUSH 100 UNIT/ML IV SOLN
500.0000 [IU] | Freq: Once | INTRAVENOUS | Status: AC | PRN
  Administered 2016-06-10: 500 [IU]
  Filled 2016-06-10: qty 5

## 2016-06-10 MED ORDER — SODIUM CHLORIDE 0.9 % IV SOLN
Freq: Once | INTRAVENOUS | Status: AC
  Administered 2016-06-10: 12:00:00 via INTRAVENOUS

## 2016-06-10 MED ORDER — CYANOCOBALAMIN 1000 MCG/ML IJ SOLN
1000.0000 ug | Freq: Once | INTRAMUSCULAR | Status: AC
  Administered 2016-06-10: 1000 ug via INTRAMUSCULAR
  Filled 2016-06-10: qty 1

## 2016-06-10 MED ORDER — ONDANSETRON HCL 4 MG PO TABS
8.0000 mg | ORAL_TABLET | Freq: Once | ORAL | Status: AC
  Administered 2016-06-10: 8 mg via ORAL
  Filled 2016-06-10: qty 2

## 2016-06-10 MED ORDER — SODIUM CHLORIDE 0.9% FLUSH
10.0000 mL | INTRAVENOUS | Status: DC | PRN
  Administered 2016-06-10: 10 mL
  Filled 2016-06-10: qty 10

## 2016-06-10 MED ORDER — PROCHLORPERAZINE MALEATE 10 MG PO TABS
10.0000 mg | ORAL_TABLET | Freq: Once | ORAL | Status: AC
  Administered 2016-06-10: 10 mg via ORAL
  Filled 2016-06-10: qty 1

## 2016-06-10 MED ORDER — SODIUM CHLORIDE 0.9 % IV SOLN
1.4000 mg/m2 | Freq: Once | INTRAVENOUS | Status: AC
  Administered 2016-06-10: 2 mg via INTRAVENOUS
  Filled 2016-06-10: qty 4

## 2016-06-10 NOTE — Patient Instructions (Signed)
[facility] Discharge Instructions for Patients Receiving Chemotherapy   Beginning January 23rd 2017 lab work for the [facility] will be done in the  Main lab at [facility] on 1st floor. If you have a lab appointment with the [place] please come in thru the  Main Entrance and check in at the main information desk   Today you received the following chemotherapy agents Halaven as well as Vit B12 injection. Follow-up as scheduled. Call clinic for any questions or concerns  To help prevent nausea and vomiting after your treatment, we encourage you to take your nausea medication   If you develop nausea and vomiting, or diarrhea that is not controlled by your medication, call the clinic.  The clinic phone number is [phone]. Office hours are Monday-Friday 8:30am-5:00pm.  BELOW ARE SYMPTOMS THAT SHOULD BE REPORTED IMMEDIATELY:  *FEVER GREATER THAN 101.0 F  *CHILLS WITH OR WITHOUT FEVER  NAUSEA AND VOMITING THAT IS NOT CONTROLLED WITH YOUR NAUSEA MEDICATION  *UNUSUAL SHORTNESS OF BREATH  *UNUSUAL BRUISING OR BLEEDING  TENDERNESS IN MOUTH AND THROAT WITH OR WITHOUT PRESENCE OF ULCERS  *URINARY PROBLEMS  *BOWEL PROBLEMS  UNUSUAL RASH Items with * indicate a potential emergency and should be followed up as soon as possible. If you have an emergency after office hours please contact your primary care physician or go to the nearest emergency department.  Please call the clinic during office hours if you have any questions or concerns.   You may also contact the Patient Navigator at [phone] should you have any questions or need assistance in obtaining follow up care.      Resources For Cancer Patients and their Caregivers ? American Cancer Society: Can assist with transportation, wigs, general needs, runs Look Good Feel Better.        [phone] ? Cancer Care: Provides financial assistance, online support groups, medication/co-pay  assistance.  1-800-813-HOPE 6691846563) ? [place] Assists [place] cancer patients and their families through emotional , educational and financial support.  ([phone] ? [place] DSS Where to apply for food stamps, Medicaid and utility assistance. 971-641-1605 ? RCATS: Transportation to medical appointments. 432-555-7559 ? Social Security Administration: May apply for disability if have a Stage IV cancer. [phone] [phone] ? [employer], Disability and Transit Services: Assists with nutrition, care and transit needs. [phone]

## 2016-06-10 NOTE — Progress Notes (Signed)
Debbie Reyes tolerated chemo tx and Vit B12 injection well without complaints or incident.Labs reviewed from 11/28 prior to administering chemotherapy VSS upon discharge. Pt discharged self ambulatory in satisfactory condition

## 2016-06-10 NOTE — Progress Notes (Signed)
[name]      HEMATOLOGY/ONCOLOGY PROGRESS NOTE  Date of Service: 03/16/2016  Patient Care Team: Zella Richer. Scotty Court, MD as PCP - General (Unknown Physician Specialty)  CHIEF COMPLAINTS:  Continued management of metastatic breast cancer    Metastatic breast cancer ([place])   06/07/2005 Initial Biopsy    Biopsy L axillary nodal mass, no breast primary identified      08/11/2005 -  Chemotherapy    AC x 4, with 20% dose reduction on last cycle      11/21/2005 - 12/30/2005 Radiation Therapy    XRT 5040 cGY to L breast and L axillae with Dr. Isidore Moos      11/21/2005 - 12/31/2010 Anti-estrogen oral therapy    Arimidex X 5 years      01/20/2011 PET scan    Widespread hypermetabolic osseous metastatic disease. Index lesions are given above. No evidence of hypermetabolic metastatic disease involving the neck, chest, abdomen or pelvis.       01/25/2011 -  Radiation Therapy    1200 cGy to the left rib and 2050 cGy in 20 fractions to the T5-T10 vertebral bodies with a boost of 675 cGy in 3 fractions           01/25/2011 Treatment Plan Change    Faslodex initiated approximately on this date then stopped. Stopped due to failure to respond. Faslodex given in a dose of 250 mg only 3 doses (July 17, July 31, August 14) and a single 500 mg dose March 24, 2011          04/25/2011 - 10/13/2011 Chemotherapy    Taxol chemotherapy given weekly 2 with one week off. Stopped due to progression          10/22/2011 - 05/11/2012 Anti-estrogen oral therapy    Aromasin and everolimus initiating but stopped due to progression          11/11/2011 Initial Biopsy    L3 vertebral body biopsy ER+PR+      11/11/2011 Procedure    Status post vertebral body augmentation for painful pathologic compression fractures at T11 and at L1 using the balloon kyphoplasty technique. Status post vertebral body augmentation for painful pathologic compression fracture at T8 using the vertebroplasty technique.      12/09/2012 - 06/10/2013 Anti-estrogen oral therapy    Fareston      04/22/2013 PET scan    Hypermetabolic left upper lobe nodule, possibly metastatic. The possibility of primary bronchogenic carcinoma is also considered. Residual hypermetabolic metastatic disease involving the liver and bones. Small left pleural effusion, stable or minimally increased from 03/07/2013.      06/11/2013 - 01/08/2014 Chemotherapy    Abraxane      01/31/2014 PET scan    Progressive metastatic lymphadenopathy within the right supraclavicular region and mediastinum.New hypermetabolic left adrenal metastasis. Mixed response of hypermetabolic liver metastases. Mild decrease in hypermetabolic activity associated with 1 cm left upper lobe pulmonary nodule. Diffuse bone metastases, with several in the thorax showing increased hypermetabolic activity consistent with mild progression of bone metastases.      02/01/2014 - 05/06/2015 Chemotherapy    XELODA 750 mg/m2 7 on/7 off       06/11/2014 PET scan    Partial metabolic response. Improving mediastinal lymphadenopathy, as above. Improving left adrenal metastasis. Prior hepatic metastasis is no longer evident.Improving multifocal osseous metastases, as above      05/04/2015 PET scan    Dominant finding is new widespread skeletal metastasis involvingthe axillary and appendicular skeleton. New lesions are not  discretely seen on the CT but recognized as new metabolic activity. New LEFT adrenal gland hypermetabolic metastasis. Foci of metabolic activity in the LEFT lung are felt to relate to atelectasis and/or infection. No clear evidence of pulmonary metastasis Moderate pleural LEFT pleural which could be sampled for cytology if clinically relevant.LEFT adrenal metastasis is actually an enlarged LEFT periaortic lymph node. This lymph node was enlarged and hypermetabolic to a greater degree FDG PET 01/31/2014 and subsequently improved and now has recurred on current exam  (05/04/2015) as a hypermetabolic enlarged LEFT periaortic lymph node.      05/04/2015 Progression    PET with progression of disease      05/06/2015 Treatment Plan Change    Megace      08/14/2015 PET scan    Suspect new hypermetabolic metastasis in posterior right hepatic lobe. Consider abdomen MRI without and with contrast for further evaluation. No significant change in probable malignant left pleural effusion and left lung atelectasis.No significant change in diffuse osseous metastatic disease. Stable hypermetabolic left abdominal paraaortic lymph node, consistent with metastatic disease      09/09/2015 - 03/07/2016 Chemotherapy    IBRANCE/FASLODEX . Ibrance at 100 mg secondary to grade 3 neutropenia      03/03/2016 PET scan    Interval overall progression of disease. Interval development of new and progression of pre-existing lesions in the liver, hypermetabolic on PET imaging, consistent with metastatic disease. Persistent hypermetabolic patchy opacity in the anterior left upper lobe with persistent chronic left pleural effusion. Diffuse bony metastases again noted with interval progression ofFDG uptake associated with the index bone lesions.      03/03/2016 Progression    PET demonstrates progression of disease.      03/11/2016 -  Chemotherapy    Halaven D1, 8 every 21 days and with Neupogen support.         HISTORY OF PRESENTING ILLNESS:  Debbie Reyes is a wonderful 75 y.o. female who has been referred to [place] by Dr .[name], MD Dr Tressie Stalker for continued management of metastatic breast cancer. She presents today for ongoing care.   Debbie Reyes is unaccompanied.  She says that her appetite has been getting much better. She ate well at Thanksgiving dinner. She's been eating a lot of broccoli and drinking a lot of water.  Denies abdominal pain, nausea, diarrhea, or constipation.  She gets some tingling in her fingers. She says it's not too bad, but she does  occasionally drop things and it does change her handwriting. She denies stumbling or falling. She denies SOB. No CP. She is pleased overall with how well she is doing.   MEDICAL HISTORY:  Past Medical History:  Diagnosis Date  . Bone metastases ([place]) 12/29/2015  . Cancer ([place])   . Low serum vitamin B12 03/18/2016  . Metastatic breast cancer ([place]) 12/29/2015  Depression   anxiety   SURGICAL HISTORY: L1 bone metastases biopsy  Axillary lymph node biopsy   SOCIAL HISTORY: Social History   Social History  . Marital status: Widowed    Spouse name: N/A  . Number of children: N/A  . Years of education: N/A   Occupational History  . Not on file.   Social History Main Topics  . Smoking status: Never Smoker  . Smokeless tobacco: Never Used  . Alcohol use No  . Drug use: No  . Sexual activity: No   Other Topics Concern  . Not on file   Social History Narrative  . No narrative  on file    FAMILY HISTORY: Patient denies family history of breast cancer, ovarian cancer or uterine cancer . Denies any family history of blood clotting or bleeding disorders   ALLERGIES:  has No Known Allergies.  MEDICATIONS:  Current Outpatient Prescriptions  Medication Sig Dispense Refill  . aspirin EC 81 MG tablet Take 81 mg by mouth daily.    [name] denosumab (XGEVA) 120 MG/1.7ML SOLN Inject 120 mg into the skin once. Pt took on 10/13/11    . EriBULin Mesylate (HALAVEN IV) Inject into the vein. Day 1 day 8, every 21 days    . HYDROcodone-acetaminophen (NORCO/VICODIN) 5-325 MG tablet Take 1 tablet by mouth every 6 (six) hours as needed. For pain 100 tablet 0  . lidocaine-prilocaine (EMLA) cream Apply topically as needed. 1 each 2  . LORazepam (ATIVAN) 1 MG tablet Take 1 mg by mouth at bedtime as needed. For sleep    . megestrol (MEGACE) 400 MG/10ML suspension Take 10 mLs (400 mg total) by mouth daily. 240 mL 1  . Melatonin 1 MG CAPS Take by mouth at bedtime as needed.    . ondansetron (ZOFRAN) 8 MG  tablet Take 1 tablet (8 mg total) by mouth 2 (two) times daily as needed (Nausea or vomiting). 30 tablet 1  . PARoxetine (PAXIL) 20 MG tablet Take 1 tablet (20 mg total) by mouth daily. 30 tablet 2  . prochlorperazine (COMPAZINE) 10 MG tablet Take 1 tablet (10 mg total) by mouth every 6 (six) hours as needed (Nausea or vomiting). 30 tablet 1  . propranolol (INDERAL) 10 MG tablet Take 1 tablet (10 mg total) by mouth 2 (two) times daily. 60 tablet 3   No current facility-administered medications for this visit.     REVIEW OF SYSTEMS:   Review of Systems  Constitutional: Negative.        Appetite improved.   HENT: Negative.   Eyes: Negative.   Respiratory: Negative.   Cardiovascular: Negative.   Gastrointestinal: Negative.  Negative for abdominal pain, constipation, diarrhea and nausea.  Genitourinary: Negative.   Musculoskeletal: Negative.   Skin: Negative.   Neurological: Positive for tingling (fingers).  Endo/Heme/Allergies: Negative.   Psychiatric/Behavioral: Negative.   All other systems reviewed and are negative. 14 point review of systems was performed and is negative except as detailed under history of present illness and above    PHYSICAL EXAMINATION: ECOG PERFORMANCE STATUS: 1 - Symptomatic but completely ambulatory   Vitals with BMI 06/10/2016  Height   Weight 100 lbs 14 oz  BMI   Systolic 364  Diastolic 62  Pulse 680  Respirations 20    Physical Exam  Constitutional: She is oriented to person, place, and time and well-developed, well-nourished, and in no distress.  Pt able to get on exam table without assistance.   HENT:  Head: Normocephalic and atraumatic.  Eyes: EOM are normal. Pupils are equal, round, and reactive to light. No scleral icterus.  Neck: Normal range of motion. Neck supple.  Cardiovascular: Normal rate, regular rhythm and normal heart sounds.   Pulmonary/Chest: Effort normal and breath sounds normal.  Abdominal: Soft. Bowel sounds are normal.  She exhibits no distension and no mass. There is no tenderness. There is no rebound and no guarding.  Musculoskeletal: Normal range of motion. She exhibits no edema.  Lymphadenopathy:    She has no cervical adenopathy.  Neurological: She is alert and oriented to person, place, and time. No cranial nerve deficit. Gait normal.  Skin: Skin is warm  and dry.  Psychiatric: Mood, memory, affect and judgment normal.  Nursing note and vitals reviewed.   LABORATORY DATA:  I have reviewed the data as listed  Results for LLOYD, CULLINAN (MRN [id number]) as of 06/10/2016 08:11  Ref. Range 06/07/2016 13:41  Sodium Latest Ref Range: 135 - 145 mmol/L 135  Potassium Latest Ref Range: 3.5 - 5.1 mmol/L 3.7  Chloride Latest Ref Range: 101 - 111 mmol/L 105  CO2 Latest Ref Range: 22 - 32 mmol/L 25  BUN Latest Ref Range: 6 - 20 mg/dL 22 (H)  Creatinine Latest Ref Range: 0.44 - 1.00 mg/dL 0.93  Calcium Latest Ref Range: 8.9 - 10.3 mg/dL 9.3  EGFR (Non-African Amer.) Latest Ref Range: >60 mL/min 59 (L)  EGFR (African American) Latest Ref Range: >60 mL/min >60  Glucose Latest Ref Range: 65 - 99 mg/dL 92  Anion gap Latest Ref Range: 5 - 15  5  Alkaline Phosphatase Latest Ref Range: 38 - 126 U/L 47  Albumin Latest Ref Range: 3.5 - 5.0 g/dL 3.6  AST Latest Ref Range: 15 - 41 U/L 18  ALT Latest Ref Range: 14 - 54 U/L 7 (L)  Total Protein Latest Ref Range: 6.5 - 8.1 g/dL 6.6  Total Bilirubin Latest Ref Range: 0.3 - 1.2 mg/dL 0.4  WBC Latest Ref Range: 4.0 - 10.5 K/uL 7.6  RBC Latest Ref Range: 3.87 - 5.11 MIL/uL 3.29 (L)  Hemoglobin Latest Ref Range: 12.0 - 15.0 g/dL 10.4 (L)  HCT Latest Ref Range: 36.0 - 46.0 % 32.3 (L)  MCV Latest Ref Range: 78.0 - 100.0 fL 98.2  MCH Latest Ref Range: 26.0 - 34.0 pg 31.6  MCHC Latest Ref Range: 30.0 - 36.0 g/dL 32.2  RDW Latest Ref Range: 11.5 - 15.5 % 19.0 (H)  Platelets Latest Ref Range: 150 - 400 K/uL 215  Neutrophils Latest Units: % 72  Lymphocytes Latest Units: % 11    Monocytes Relative Latest Units: % 17  Eosinophil Latest Units: % 0  Basophil Latest Units: % 0  NEUT# Latest Ref Range: 1.7 - 7.7 K/uL 5.4  Lymphocyte # Latest Ref Range: 0.7 - 4.0 K/uL 0.8  Monocyte # Latest Ref Range: 0.1 - 1.0 K/uL 1.3 (H)  Eosinophils Absolute Latest Ref Range: 0.0 - 0.7 K/uL 0.0  Basophils Absolute Latest Ref Range: 0.0 - 0.1 K/uL 0.0    Results for TERRI, RORRER (MRN [id number]) as of 06/10/2016 08:11  Ref. Range 04/29/2016 13:33 05/10/2016 13:32 05/13/2016 08:44 05/20/2016 11:43 06/07/2016 13:41  CA 15-3 Latest Ref Range: 0.0 - 25.0 U/mL    227.3 (H)   CA 27.29 Latest Ref Range: 0.0 - 38.6 U/mL    323.1 (H)          RADIOGRAPHIC STUDIES: I have personally reviewed the radiological images as listed and agreed with the findings in the report. Study Result   CLINICAL DATA:  Subsequent treatment strategy for breast cancer.  EXAM: NUCLEAR MEDICINE PET SKULL BASE TO THIGH  TECHNIQUE: 5.3 mCi F-18 FDG was injected intravenously. Full-ring PET imaging was performed from the skull base to thigh after the radiotracer. CT data was obtained and used for attenuation correction and anatomic localization.  FASTING BLOOD GLUCOSE:  Value: 106 mg/dl  COMPARISON:  12/15/2015.  FINDINGS: NECK  Increased uptake in the left sternocleidomastoid muscle, likely related to patient motion.  CHEST  The nodular density seen previously in the anterior left upper lobe (image 35 series 8 today) measures 6 mm today with SUV  max = 6.3 compared to 6.0 previously. The bandlike linear atelectasis scarring in the anterior left upper lobe is stable without substantial hypermetabolism. Chronic left pleural effusion with pleural thickening again noted. There is activity in the posterior aspect of the collapse/ consolidation at the left lung base, decreased slightly in the interval.  Small right pleural effusion is stable. Right Port-A-Cath tip projects at the  SVC/RA junction.  ABDOMEN/PELVIS  Interval development of increasing and new hypermetabolic metastases in the liver. 17 mm hypermetabolic lesion in the tip of the lateral segment left liver was 5 mm previously. SUV max = 8.0 on today's exam.  6 mm hypermetabolic lesions seen in the medial right liver on the previous study has progressed 17 mm today (image 91 series 4) with SUV max = 6.0.  13 mm hypodense lesion seen previously in the inferior right hepatic lobe is stable. As before, this lesion is not substantially hypermetabolic.  There is abdominal aortic atherosclerosis without aneurysm.  SKELETON  Multiple hypermetabolic bony metastases are evident.  Sternal lesion measured previously has SUV max = 8.1 today compared to 6.4 previously.  Mid thoracic vertebral body lesion with SUV max = 7.4 today compared to 5.3 previously.  Index lesion in the right iliac crest is 14.5 today compared to 10.3 previously.  IMPRESSION: 1. Interval overall progression of disease. 2. Interval development of new and progression of pre-existing lesions in the liver, hypermetabolic on PET imaging, consistent with metastatic disease. 3. Persistent hypermetabolic patchy opacity in the anterior left upper lobe with persistent chronic left pleural effusion. 4. Diffuse bony metastases again noted with interval progression of FDG uptake associated with the index bone lesions.   Electronically Signed   By: Misty Stanley M.D.   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 Tumor markers are falling, weight is stable to improved. Energy is baseline. We will continue with ongoing therapy.   She is to continue on B12 and folate. I  advised her to try SL B12.   She is due for repeat restaging and PET is ordered for 12/18.  I have refilled her hydrocodone.    She will return for a follow up in 3 weeks, after her PET scan.  Meds ordered this encounter  Medications  . HYDROcodone-acetaminophen (NORCO/VICODIN) 5-325 MG tablet    Sig: Take 1 tablet by mouth every 6 (six) hours as needed. For pain, ok to fill on or after 06/17/2016    Dispense:  100 tablet    Refill:  0   All of the patients questions were answered to her apparent satisfaction. The patient knows to call the clinic with any problems, questions or concerns.  This document serves as a record of services personally performed by Ancil Linsey, MD. It was created on her behalf by Martinique Casey, a trained medical scribe. The creation of this record is based on the scribe's personal observations and the provider's statements to them. This document has been checked and approved by the attending provider.  I have reviewed the above documentation for accuracy and completeness and I agree with the above.  Kelby Fam. Whitney Muse, MD

## 2016-06-10 NOTE — Patient Instructions (Addendum)
[place] at [facility] Discharge Instructions  RECOMMENDATIONS MADE BY THE CONSULTANT AND ANY TEST RESULTS WILL BE SENT TO YOUR REFERRING PHYSICIAN.  You saw Dr.Penland today.  Follow up after PET scan.  Continue chemo.  SL B12 [phone] mg daily  See Amy at checkout for appointments.  Thank you for choosing [place] at [facility] to provide your oncology and hematology care.  To afford each patient quality time with our provider, please arrive at least 15 minutes before your scheduled appointment time.   Beginning January 23rd 2017 lab work for the [facility] will be done in the  Main lab at [facility] on 1st floor. If you have a lab appointment with the [facility] please come in thru the  Main Entrance and check in at the main information desk  You need to re-schedule your appointment should you arrive 10 or more minutes late.  We strive to give you quality time with our providers, and arriving late affects you and other patients whose appointments are after yours.  Also, if you no show three or more times for appointments you may be dismissed from the clinic at the providers discretion.     Again, thank you for choosing [facility].  Our hope is that these requests will decrease the amount of time that you wait before being seen by our physicians.       _____________________________________________________________  Should you have questions after your visit to [facility], please contact our office at [phone] between the hours of 8:30 a.m. and 4:30 p.m.  Voicemails left after 4:30 p.m. will not be returned until the following business day.  For prescription refill requests, have your pharmacy contact our office.         Resources For Cancer Patients and their Caregivers ? American Cancer Society: Can assist with transportation, wigs, general needs, runs Look Good Feel Better.         985-216-7168 ? Cancer Care: Provides financial assistance, online support groups, medication/co-pay assistance.  1-800-813-HOPE 947-396-5184) ? [place] Assists [place] cancer patients and their families through emotional , educational and financial support.  (214)087-6407 ? [place] DSS Where to apply for food stamps, Medicaid and utility assistance. 905-637-1086 ? RCATS: Transportation to medical appointments. 920-247-7573 ? Social Security Administration: May apply for disability if have a Stage IV cancer. (364) 613-2990 612 149 9391 ? [employer], Disability and Transit Services: Assists with nutrition, care and transit needs. [place] Support Programs: @10RELATIVEDAYS @ > Cancer Support Group  2nd Tuesday of the month 1pm-2pm, Journey Room  > Creative Journey  3rd Tuesday of the month 1130am-1pm, Journey Room  > Look Good Feel Better  1st Wednesday of the month 10am-12 noon, Journey Room (Call [place] to register [phone])

## 2016-06-13 ENCOUNTER — Encounter (HOSPITAL_BASED_OUTPATIENT_CLINIC_OR_DEPARTMENT_OTHER): Payer: Medicare Other

## 2016-06-13 VITALS — BP 102/60 | HR 104 | Temp 97.4°F | Resp 18

## 2016-06-13 DIAGNOSIS — C50919 Malignant neoplasm of unspecified site of unspecified female breast: Secondary | ICD-10-CM

## 2016-06-13 DIAGNOSIS — D701 Agranulocytosis secondary to cancer chemotherapy: Secondary | ICD-10-CM | POA: Diagnosis present

## 2016-06-13 DIAGNOSIS — D702 Other drug-induced agranulocytosis: Secondary | ICD-10-CM

## 2016-06-13 MED ORDER — FILGRASTIM 300 MCG/0.5ML IJ SOSY
300.0000 ug | PREFILLED_SYRINGE | Freq: Once | INTRAMUSCULAR | Status: AC
  Administered 2016-06-13: 300 ug via SUBCUTANEOUS
  Filled 2016-06-13: qty 0.5

## 2016-06-13 NOTE — Progress Notes (Signed)
Cortni J Linehan tolerated Neupogen injection well without complaints or incident. VSS Pt discharged self ambulatory in satisfactory condition

## 2016-06-13 NOTE — Patient Instructions (Signed)
[place] at [facility] Discharge Instructions  RECOMMENDATIONS MADE BY THE CONSULTANT AND ANY TEST RESULTS WILL BE SENT TO YOUR REFERRING PHYSICIAN.  Receoved Neupogen injection today. Follow-up as scheduled. Call clinic for any questions or concerns  Thank you for choosing [place] at [facility] to provide your oncology and hematology care.  To afford each patient quality time with our provider, please arrive at least 15 minutes before your scheduled appointment time.   Beginning January 23rd 2017 lab work for the [facility] will be done in the  Main lab at [facility] on 1st floor. If you have a lab appointment with the [facility] please come in thru the  Main Entrance and check in at the main information desk  You need to re-schedule your appointment should you arrive 10 or more minutes late.  We strive to give you quality time with our providers, and arriving late affects you and other patients whose appointments are after yours.  Also, if you no show three or more times for appointments you may be dismissed from the clinic at the providers discretion.     Again, thank you for choosing [facility].  Our hope is that these requests will decrease the amount of time that you wait before being seen by our physicians.       _____________________________________________________________  Should you have questions after your visit to [facility], please contact our office at [phone] between the hours of 8:30 a.m. and 4:30 p.m.  Voicemails left after 4:30 p.m. will not be returned until the following business day.  For prescription refill requests, have your pharmacy contact our office.         Resources For Cancer Patients and their Caregivers ? American Cancer Society: Can assist with transportation, wigs, general needs, runs Look Good Feel Better.        (267) 830-8306 ? Cancer Care: Provides  financial assistance, online support groups, medication/co-pay assistance.  1-800-813-HOPE 701-130-2064) ? [place] Assists [place] cancer patients and their families through emotional , educational and financial support.  669-303-8010 ? [place] DSS Where to apply for food stamps, Medicaid and utility assistance. 2368788286 ? RCATS: Transportation to medical appointments. 613-163-3766 ? Social Security Administration: May apply for disability if have a Stage IV cancer. (941)228-3366 310-801-1344 ? [employer], Disability and Transit Services: Assists with nutrition, care and transit needs. [place] Support Programs: @10RELATIVEDAYS @ > Cancer Support Group  2nd Tuesday of the month 1pm-2pm, Journey Room  > Creative Journey  3rd Tuesday of the month 1130am-1pm, Journey Room  > Look Good Feel Better  1st Wednesday of the month 10am-12 noon, Journey Room (Call [place] to register [phone])

## 2016-06-14 ENCOUNTER — Encounter (HOSPITAL_BASED_OUTPATIENT_CLINIC_OR_DEPARTMENT_OTHER): Payer: Medicare Other

## 2016-06-14 VITALS — BP 110/51 | HR 95 | Temp 97.5°F | Resp 18

## 2016-06-14 DIAGNOSIS — D701 Agranulocytosis secondary to cancer chemotherapy: Secondary | ICD-10-CM

## 2016-06-14 DIAGNOSIS — C50919 Malignant neoplasm of unspecified site of unspecified female breast: Secondary | ICD-10-CM | POA: Diagnosis not present

## 2016-06-14 DIAGNOSIS — D702 Other drug-induced agranulocytosis: Secondary | ICD-10-CM

## 2016-06-14 MED ORDER — FILGRASTIM 300 MCG/0.5ML IJ SOSY
300.0000 ug | PREFILLED_SYRINGE | Freq: Once | INTRAMUSCULAR | Status: AC
  Administered 2016-06-14: 300 ug via SUBCUTANEOUS
  Filled 2016-06-14: qty 0.5

## 2016-06-14 NOTE — Patient Instructions (Signed)
[place] at [facility] Discharge Instructions  RECOMMENDATIONS MADE BY THE CONSULTANT AND ANY TEST RESULTS WILL BE SENT TO YOUR REFERRING PHYSICIAN.  Received Neupogen injection today. Follow-up as scheduled. Call clinic for any questions or concerns  Thank you for choosing [place] at [facility] to provide your oncology and hematology care.  To afford each patient quality time with our provider, please arrive at least 15 minutes before your scheduled appointment time.   Beginning January 23rd 2017 lab work for the [facility] will be done in the  Main lab at [facility] on 1st floor. If you have a lab appointment with the [place] please come in thru the  Main Entrance and check in at the main information desk  You need to re-schedule your appointment should you arrive 10 or more minutes late.  We strive to give you quality time with our providers, and arriving late affects you and other patients whose appointments are after yours.  Also, if you no show three or more times for appointments you may be dismissed from the clinic at the providers discretion.     Again, thank you for choosing [facility].  Our hope is that these requests will decrease the amount of time that you wait before being seen by our physicians.       _____________________________________________________________  Should you have questions after your visit to [facility] please contact our office at [phone] between the hours of 8:30 a.m. and 4:30 p.m.  Voicemails left after 4:30 p.m. will not be returned until the following business day.  For prescription refill requests, have your pharmacy contact our office.         Resources For Cancer Patients and their Caregivers ? American Cancer Society: Can assist with transportation, wigs, general needs, runs Look Good Feel Better.        863-012-6533 ? Cancer Care: Provides  financial assistance, online support groups, medication/co-pay assistance.  1-800-813-HOPE 224 582 7421) ? [place] Assists [place] cancer patients and their families through emotional , educational and financial support.  (808)283-2733 ? [place] DSS Where to apply for food stamps, Medicaid and utility assistance. 661-272-9585 ? RCATS: Transportation to medical appointments. (905)015-2592 ? Social Security Administration: May apply for disability if have a Stage IV cancer. (310)876-7141 (607)223-0955 ? [employer], Disability and Transit Services: Assists with nutrition, care and transit needs. [place] Support Programs: @10RELATIVEDAYS @ > Cancer Support Group  2nd Tuesday of the month 1pm-2pm, Journey Room  > Creative Journey  3rd Tuesday of the month 1130am-1pm, Journey Room  > Look Good Feel Better  1st Wednesday of the month 10am-12 noon, Journey Room (Call [place] to register [phone])

## 2016-06-14 NOTE — Progress Notes (Signed)
Debbie Reyes tolerated Neupogen injection well without complaints or incident. VSS Pt discharged self ambulatory in satisfactory condition

## 2016-06-17 ENCOUNTER — Encounter (HOSPITAL_BASED_OUTPATIENT_CLINIC_OR_DEPARTMENT_OTHER): Payer: Medicare Other

## 2016-06-17 VITALS — BP 120/57 | HR 93 | Temp 98.4°F | Resp 18 | Wt 101.2 lb

## 2016-06-17 DIAGNOSIS — Z5111 Encounter for antineoplastic chemotherapy: Secondary | ICD-10-CM

## 2016-06-17 DIAGNOSIS — C799 Secondary malignant neoplasm of unspecified site: Secondary | ICD-10-CM | POA: Diagnosis not present

## 2016-06-17 DIAGNOSIS — C50912 Malignant neoplasm of unspecified site of left female breast: Secondary | ICD-10-CM | POA: Diagnosis not present

## 2016-06-17 DIAGNOSIS — C7951 Secondary malignant neoplasm of bone: Secondary | ICD-10-CM | POA: Diagnosis not present

## 2016-06-17 DIAGNOSIS — C50919 Malignant neoplasm of unspecified site of unspecified female breast: Secondary | ICD-10-CM

## 2016-06-17 DIAGNOSIS — D702 Other drug-induced agranulocytosis: Secondary | ICD-10-CM

## 2016-06-17 LAB — CBC WITH DIFFERENTIAL/PLATELET
BASOS PCT: 1 %
Basophils Absolute: 0.1 10*3/uL (ref 0.0–0.1)
EOS PCT: 0 %
Eosinophils Absolute: 0 10*3/uL (ref 0.0–0.7)
HEMATOCRIT: 31.7 % — AB (ref 36.0–46.0)
Hemoglobin: 10.2 g/dL — ABNORMAL LOW (ref 12.0–15.0)
LYMPHS ABS: 1.2 10*3/uL (ref 0.7–4.0)
Lymphocytes Relative: 15 %
MCH: 31.6 pg (ref 26.0–34.0)
MCHC: 32.2 g/dL (ref 30.0–36.0)
MCV: 98.1 fL (ref 78.0–100.0)
MONO ABS: 2.8 10*3/uL — AB (ref 0.1–1.0)
MONOS PCT: 34 %
Neutro Abs: 4.1 10*3/uL (ref 1.7–7.7)
Neutrophils Relative %: 50 %
PLATELETS: 276 10*3/uL (ref 150–400)
RBC: 3.23 MIL/uL — AB (ref 3.87–5.11)
RDW: 19.1 % — AB (ref 11.5–15.5)
WBC Morphology: INCREASED
WBC: 8.2 10*3/uL (ref 4.0–10.5)

## 2016-06-17 LAB — COMPREHENSIVE METABOLIC PANEL
ALT: 12 U/L — AB (ref 14–54)
AST: 27 U/L (ref 15–41)
Albumin: 3.8 g/dL (ref 3.5–5.0)
Alkaline Phosphatase: 64 U/L (ref 38–126)
Anion gap: 9 (ref 5–15)
BILIRUBIN TOTAL: 0.9 mg/dL (ref 0.3–1.2)
BUN: 20 mg/dL (ref 6–20)
CHLORIDE: 108 mmol/L (ref 101–111)
CO2: 22 mmol/L (ref 22–32)
CREATININE: 0.9 mg/dL (ref 0.44–1.00)
Calcium: 9 mg/dL (ref 8.9–10.3)
Glucose, Bld: 102 mg/dL — ABNORMAL HIGH (ref 65–99)
POTASSIUM: 3.5 mmol/L (ref 3.5–5.1)
Sodium: 139 mmol/L (ref 135–145)
TOTAL PROTEIN: 6.9 g/dL (ref 6.5–8.1)

## 2016-06-17 MED ORDER — SODIUM CHLORIDE 0.9 % IV SOLN
Freq: Once | INTRAVENOUS | Status: AC
  Administered 2016-06-17: 13:00:00 via INTRAVENOUS

## 2016-06-17 MED ORDER — PROCHLORPERAZINE MALEATE 10 MG PO TABS
10.0000 mg | ORAL_TABLET | Freq: Once | ORAL | Status: AC
  Administered 2016-06-17: 10 mg via ORAL
  Filled 2016-06-17: qty 1

## 2016-06-17 MED ORDER — SODIUM CHLORIDE 0.9% FLUSH: 10.0000 mL | INTRAVENOUS | Status: DC | PRN

## 2016-06-17 MED ORDER — HEPARIN SOD (PORK) LOCK FLUSH 100 UNIT/ML IV SOLN
500.0000 [IU] | Freq: Once | INTRAVENOUS | Status: AC | PRN
  Administered 2016-06-17: 500 [IU]
  Filled 2016-06-17: qty 5

## 2016-06-17 MED ORDER — ONDANSETRON HCL 4 MG PO TABS
8.0000 mg | ORAL_TABLET | Freq: Once | ORAL | Status: AC
  Administered 2016-06-17: 8 mg via ORAL
  Filled 2016-06-17: qty 2

## 2016-06-17 MED ORDER — SODIUM CHLORIDE 0.9 % IV SOLN
1.4000 mg/m2 | Freq: Once | INTRAVENOUS | Status: AC
  Administered 2016-06-17: 2 mg via INTRAVENOUS
  Filled 2016-06-17: qty 4

## 2016-06-17 NOTE — Progress Notes (Signed)
Will hold Neupogen for now per Dr. Whitney Muse.

## 2016-06-17 NOTE — Patient Instructions (Signed)
[facility] Discharge Instructions for Patients Receiving Chemotherapy   Beginning January 23rd 2017 lab work for the [facility] will be done in the  Main lab at [facility] on 1st floor. If you have a lab appointment with the [place] please come in thru the  Main Entrance and check in at the main information desk   Today you received the following chemotherapy agents:  Halaven  If you develop nausea and vomiting, or diarrhea that is not controlled by your medication, call the clinic.  The clinic phone number is [phone]. Office hours are Monday-Friday 8:30am-5:00pm.  BELOW ARE SYMPTOMS THAT SHOULD BE REPORTED IMMEDIATELY:  *FEVER GREATER THAN 101.0 F  *CHILLS WITH OR WITHOUT FEVER  NAUSEA AND VOMITING THAT IS NOT CONTROLLED WITH YOUR NAUSEA MEDICATION  *UNUSUAL SHORTNESS OF BREATH  *UNUSUAL BRUISING OR BLEEDING  TENDERNESS IN MOUTH AND THROAT WITH OR WITHOUT PRESENCE OF ULCERS  *URINARY PROBLEMS  *BOWEL PROBLEMS  UNUSUAL RASH Items with * indicate a potential emergency and should be followed up as soon as possible. If you have an emergency after office hours please contact your primary care physician or go to the nearest emergency department.  Please call the clinic during office hours if you have any questions or concerns.   You may also contact the Patient Navigator at [phone] should you have any questions or need assistance in obtaining follow up care.      Resources For Cancer Patients and their Caregivers ? American Cancer Society: Can assist with transportation, wigs, general needs, runs Look Good Feel Better.        939-851-0605 ? Cancer Care: Provides financial assistance, online support groups, medication/co-pay assistance.  1-800-813-HOPE 947-102-7927) ? [place] Assists [place] cancer patients and their families through emotional , educational and financial support.   404-550-9608 ? [place] DSS Where to apply for food stamps, Medicaid and utility assistance. (782)793-3894 ? RCATS: Transportation to medical appointments. 510-040-2383 ? Social Security Administration: May apply for disability if have a Stage IV cancer. 801-568-8931 [phone])[phone] ? [employer], Disability and Transit Services: Assists with nutrition, care and transit needs. [phone]

## 2016-06-17 NOTE — Progress Notes (Signed)
Tolerated tx w/o adverse reaction.  Alert, in no distress.  VSS.  Discharged ambulatory. 

## 2016-06-27 ENCOUNTER — Encounter (HOSPITAL_COMMUNITY)
Admission: RE | Admit: 2016-06-27 | Discharge: 2016-06-27 | Disposition: A | Discharge status: Home / Self Care | Payer: Medicare Other | Source: Ambulatory Visit | Attending: Hematology & Oncology | Admitting: Hematology & Oncology

## 2016-06-27 DIAGNOSIS — C787 Secondary malignant neoplasm of liver and intrahepatic bile duct: Secondary | ICD-10-CM | POA: Diagnosis not present

## 2016-06-27 DIAGNOSIS — C7951 Secondary malignant neoplasm of bone: Secondary | ICD-10-CM | POA: Diagnosis not present

## 2016-06-27 DIAGNOSIS — C50919 Malignant neoplasm of unspecified site of unspecified female breast: Secondary | ICD-10-CM | POA: Diagnosis not present

## 2016-06-27 LAB — GLUCOSE, CAPILLARY: Glucose-Capillary: 100 mg/dL — ABNORMAL HIGH (ref 65–99)

## 2016-06-27 MED ORDER — FLUDEOXYGLUCOSE F - 18 (FDG) INJECTION
5.1600 | Freq: Once | INTRAVENOUS | Status: AC | PRN
  Administered 2016-06-27: 5.16 via INTRAVENOUS

## 2016-06-28 ENCOUNTER — Encounter (HOSPITAL_BASED_OUTPATIENT_CLINIC_OR_DEPARTMENT_OTHER): Payer: Medicare Other | Admitting: Oncology

## 2016-06-28 ENCOUNTER — Encounter (HOSPITAL_COMMUNITY): Payer: Self-pay | Admitting: Oncology

## 2016-06-28 DIAGNOSIS — Z170 Estrogen receptor positive status [ER+]: Secondary | ICD-10-CM

## 2016-06-28 DIAGNOSIS — C7951 Secondary malignant neoplasm of bone: Secondary | ICD-10-CM | POA: Diagnosis not present

## 2016-06-28 DIAGNOSIS — C50912 Malignant neoplasm of unspecified site of left female breast: Secondary | ICD-10-CM

## 2016-06-28 DIAGNOSIS — D6481 Anemia due to antineoplastic chemotherapy: Secondary | ICD-10-CM | POA: Diagnosis not present

## 2016-06-28 DIAGNOSIS — C50919 Malignant neoplasm of unspecified site of unspecified female breast: Secondary | ICD-10-CM

## 2016-06-28 DIAGNOSIS — D701 Agranulocytosis secondary to cancer chemotherapy: Secondary | ICD-10-CM

## 2016-06-28 NOTE — Patient Instructions (Signed)
[place] at [facility] Discharge Instructions  RECOMMENDATIONS MADE BY THE CONSULTANT AND ANY TEST RESULTS WILL BE SENT TO YOUR REFERRING PHYSICIAN.  You were seen today by Kirby Crigler PA-C.  Treatment on 07/01/16. Neupogen and Xgeva on 07/05/16. Neupogen on 07/06/16. Treatment and pneumovax 07/08/16. Follow up in 3 weeks.  Thank you for choosing [place] at [facility] to provide your oncology and hematology care.  To afford each patient quality time with our provider, please arrive at least 15 minutes before your scheduled appointment time.   Beginning January 23rd 2017 lab work for the [facility] will be done in the  Main lab at [facility] on 1st floor. If you have a lab appointment with the [place] please come in thru the  Main Entrance and check in at the main information desk  You need to re-schedule your appointment should you arrive 10 or more minutes late.  We strive to give you quality time with our providers, and arriving late affects you and other patients whose appointments are after yours.  Also, if you no show three or more times for appointments you may be dismissed from the clinic at the providers discretion.     Again, thank you for choosing [facility].  Our hope is that these requests will decrease the amount of time that you wait before being seen by our physicians.       _____________________________________________________________  Should you have questions after your visit to [facility], please contact our office at [phone] between the hours of 8:30 a.m. and 4:30 p.m.  Voicemails left after 4:30 p.m. will not be returned until the following business day.  For prescription refill requests, have your pharmacy contact our office.         Resources For Cancer Patients and their Caregivers ? American Cancer Society: Can assist with transportation, wigs, general needs, runs  Look Good Feel Better.        403-787-3462 ? Cancer Care: Provides financial assistance, online support groups, medication/co-pay assistance.  1-800-813-HOPE 331-803-2436) ? [place] Assists [place] cancer patients and their families through emotional , educational and financial support.  606-245-9395 ? [place] DSS Where to apply for food stamps, Medicaid and utility assistance. 5014687446 ? RCATS: Transportation to medical appointments. [phone] ? Social Security Administration: May apply for disability if have a Stage IV cancer. [phone] ? [employer], Disability and Transit Services: Assists with nutrition, care and transit needs. [place] Support Programs: @10RELATIVEDAYS @ > Cancer Support Group  2nd Tuesday of the month 1pm-2pm, Journey Room  > Creative Journey  3rd Tuesday of the month 1130am-1pm, Journey Room  > Look Good Feel Better  1st Wednesday of the month 10am-12 noon, Journey Room (Call [place] to register [phone])

## 2016-06-28 NOTE — Assessment & Plan Note (Addendum)
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  Complicated by chemotherapy induced anemia and neutropenia.  On Xgeva for bone metastases.  Now on Halaven days 1, 8 every 21 days (with Neupogen support) beginning on 03/11/2016.  Oncology history is updated.  She is here to review her PET imaging.  I personally reviewed and went over radiographic studies with the patient.  The results are noted within this dictation.  PET scan demonstrates improvement in disease, overall.  Pre-treatment labs as ordered day 1 of treatment: CBC diff, CMET, CA 27.29, and CA 15-3  She is scheduled for Xgeva on 07/05/2016.  She will get Neupogen that day too.  She is scheduled for pneumovax on 07/08/2016.  She will be due for treatment on that day too for Day 8.  She will return on 12/22 for day 1 of treatment followed by Neupogen on 12/26 and 12/27.  She is tolerating treatment well and is not interested in an adjustment in her treatment for Christmas.  We will stay on schedule as a result.  Return for follow-up on day 1 of next cycle (~ 3 weeks).

## 2016-06-28 NOTE — Progress Notes (Signed)
Debbie Lair, MD [place] [id number]  Metastatic breast cancer [facility]) - Plan: Cancer antigen 27.29, Cancer antigen 15-3  CURRENT THERAPY: Halaven days 1, 8 every 21 days (with Neupogen support) beginning on 03/11/2016 and Xgeva monthly.  INTERVAL HISTORY: Debbie Reyes 75 y.o. 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  Complicated by chemotherapy induced anemia and neutropenia.  On Xgeva for bone metastases.    Metastatic breast cancer ([place])   06/07/2005 Initial Biopsy    Biopsy L axillary nodal mass, no breast primary identified      08/11/2005 -  Chemotherapy    AC x 4, with 20% dose reduction on last cycle      11/21/2005 - 12/30/2005 Radiation Therapy    XRT 5040 cGY to L breast and L axillae with Dr. Isidore Moos      11/21/2005 - 12/31/2010 Anti-estrogen oral therapy    Arimidex X 5 years      01/20/2011 PET scan    Widespread hypermetabolic osseous metastatic disease. Index lesions are given above. No evidence of hypermetabolic metastatic disease involving the neck, chest, abdomen or pelvis.       01/25/2011 -  Radiation Therapy    1200 cGy to the left rib and 2050 cGy in 20 fractions to the T5-T10 vertebral bodies with a boost of 675 cGy in 3 fractions           01/25/2011 Treatment Plan Change    Faslodex initiated approximately on this date then stopped. Stopped due to failure to respond. Faslodex given in a dose of 250 mg only 3 doses (July 17, July 31, August 14) and a single 500 mg dose March 24, 2011          04/25/2011 - 10/13/2011 Chemotherapy    Taxol chemotherapy given weekly 2 with one week off. Stopped due to progression          10/22/2011 - 05/11/2012 Anti-estrogen oral therapy    Aromasin and everolimus initiating but stopped due to progression          11/11/2011 Initial Biopsy    L3  vertebral body biopsy ER+PR+      11/11/2011 Procedure    Status post vertebral body augmentation for painful pathologic compression fractures at T11 and at L1 using the balloon kyphoplasty technique. Status post vertebral body augmentation for painful pathologic compression fracture at T8 using the vertebroplasty technique.      12/09/2012 - 06/10/2013 Anti-estrogen oral therapy    Fareston      04/22/2013 PET scan    Hypermetabolic left upper lobe nodule, possibly metastatic. The possibility of primary bronchogenic carcinoma is also considered. Residual hypermetabolic metastatic disease involving the liver and bones. Small left pleural effusion, stable or minimally increased from 03/07/2013.      06/11/2013 - 01/08/2014 Chemotherapy    Abraxane      01/31/2014 PET scan    Progressive metastatic lymphadenopathy within the right supraclavicular region and mediastinum.New hypermetabolic left adrenal metastasis. Mixed response of hypermetabolic liver metastases. Mild decrease in hypermetabolic activity associated with 1 cm left upper lobe pulmonary nodule. Diffuse bone metastases, with several in the thorax showing increased hypermetabolic activity consistent with mild progression of bone metastases.      02/01/2014 - 05/06/2015 Chemotherapy    XELODA 750 mg/m2 7 on/7 off       06/11/2014  PET scan    Partial metabolic response. Improving mediastinal lymphadenopathy, as above. Improving left adrenal metastasis. Prior hepatic metastasis is no longer evident.Improving multifocal osseous metastases, as above      05/04/2015 PET scan    Dominant finding is new widespread skeletal metastasis involvingthe axillary and appendicular skeleton. New lesions are not discretely seen on the CT but recognized as new metabolic activity. New LEFT adrenal gland hypermetabolic metastasis. Foci of metabolic activity in the LEFT lung are felt to relate to atelectasis and/or infection. No clear evidence of pulmonary  metastasis Moderate pleural LEFT pleural which could be sampled for cytology if clinically relevant.LEFT adrenal metastasis is actually an enlarged LEFT periaortic lymph node. This lymph node was enlarged and hypermetabolic to a greater degree FDG PET 01/31/2014 and subsequently improved and now has recurred on current exam (05/04/2015) as a hypermetabolic enlarged LEFT periaortic lymph node.      05/04/2015 Progression    PET with progression of disease      05/06/2015 Treatment Plan Change    Megace      08/14/2015 PET scan    Suspect new hypermetabolic metastasis in posterior right hepatic lobe. Consider abdomen MRI without and with contrast for further evaluation. No significant change in probable malignant left pleural effusion and left lung atelectasis.No significant change in diffuse osseous metastatic disease. Stable hypermetabolic left abdominal paraaortic lymph node, consistent with metastatic disease      09/09/2015 - 03/07/2016 Chemotherapy    IBRANCE/FASLODEX . Ibrance at 100 mg secondary to grade 3 neutropenia      03/03/2016 PET scan    Interval overall progression of disease. Interval development of new and progression of pre-existing lesions in the liver, hypermetabolic on PET imaging, consistent with metastatic disease. Persistent hypermetabolic patchy opacity in the anterior left upper lobe with persistent chronic left pleural effusion. Diffuse bony metastases again noted with interval progression ofFDG uptake associated with the index bone lesions.      03/03/2016 Progression    PET demonstrates progression of disease.      03/11/2016 -  Chemotherapy    Halaven D1, 8 every 21 days and with Neupogen support.       06/27/2016 PET scan    1. Overall mild improvement of multifocal metastatic disease. 2. Improvement in metabolic nodularity at the LEFT lung base. 3. Improvement in hepatic metastasis. 4. Overall improvement skeletal metastasis. Multiple foci of  intense hypermetabolic activity remain within the skeleton remain.       She is here to review her PET imaging and for further medical oncology recommendations.  She denies any new medical oncology complaints.  She denies any new pains.  She is tolerating therapy very well without any issues.  Review of Systems  Constitutional: Negative.  Negative for chills, fever and weight loss.  HENT: Negative.   Eyes: Negative.   Respiratory: Negative.  Negative for cough.   Cardiovascular: Negative.  Negative for chest pain.  Gastrointestinal: Negative.  Negative for constipation, diarrhea, nausea and vomiting.  Genitourinary: Negative.   Musculoskeletal: Negative.  Negative for back pain.  Skin: Negative.   Neurological: Negative.  Negative for weakness.  Endo/Heme/Allergies: Negative.   Psychiatric/Behavioral: Negative.     Past Medical History:  Diagnosis Date  . Bone metastases ([place]) 12/29/2015  . Cancer ([place])   . Low serum vitamin B12 03/18/2016  . Metastatic breast cancer ([place]) 12/29/2015    History reviewed. No pertinent surgical history.  History reviewed. No pertinent family history.  Social History  Social History  . Marital status: Widowed    Spouse name: N/A  . Number of children: N/A  . Years of education: N/A   Social History Main Topics  . Smoking status: Never Smoker  . Smokeless tobacco: Never Used  . Alcohol use No  . Drug use: No  . Sexual activity: No   Other Topics Concern  . None   Social History Narrative  . None     PHYSICAL EXAMINATION  ECOG PERFORMANCE STATUS: 1 - Symptomatic but completely ambulatory  Vitals:   06/28/16 1401  BP: 116/66  Pulse: 93  Resp: 18  Temp: 97.5 F (36.4 C)    GENERAL:alert, well nourished, well developed, comfortable, cooperative, smiling and unaccompanied. SKIN: skin color, texture, turgor are normal, no rashes or significant lesions HEAD: Normocephalic, No masses, lesions, tenderness or  abnormalities EYES: normal, EOMI, Conjunctiva are pink and non-injected EARS: External ears normal OROPHARYNX:lips, buccal mucosa, and tongue normal and mucous membranes are moist  NECK: supple, trachea midline LYMPH:  no palpable lymphadenopathy BREAST:not examined LUNGS: clear to auscultation and percussion HEART: regular rate & rhythm, no murmurs, no gallops, S1 normal and S2 normal ABDOMEN:abdomen soft and normal bowel sounds BACK: Back symmetric, no curvature. EXTREMITIES:less then 2 second capillary refill, no joint deformities, effusion, or inflammation, no skin discoloration, no cyanosis  NEURO: alert & oriented x 3 with fluent speech, no focal motor/sensory deficits, gait normal   LABORATORY DATA: CBC    Component Value Date/Time   WBC 8.2 06/17/2016 1126   RBC 3.23 (L) 06/17/2016 1126   HGB 10.2 (L) 06/17/2016 1126   HCT 31.7 (L) 06/17/2016 1126   PLT 276 06/17/2016 1126   MCV 98.1 06/17/2016 1126   MCH 31.6 06/17/2016 1126   MCHC 32.2 06/17/2016 1126   RDW 19.1 (H) 06/17/2016 1126   LYMPHSABS 1.2 06/17/2016 1126   MONOABS 2.8 (H) 06/17/2016 1126   EOSABS 0.0 06/17/2016 1126   BASOSABS 0.1 06/17/2016 1126      Chemistry      Component Value Date/Time   NA 139 06/17/2016 1126   K 3.5 06/17/2016 1126   CL 108 06/17/2016 1126   CO2 22 06/17/2016 1126   BUN 20 06/17/2016 1126   CREATININE 0.90 06/17/2016 1126      Component Value Date/Time   CALCIUM 9.0 06/17/2016 1126   ALKPHOS 64 06/17/2016 1126   AST 27 06/17/2016 1126   ALT 12 (L) 06/17/2016 1126   BILITOT 0.9 06/17/2016 1126        PENDING LABS:   RADIOGRAPHIC STUDIES:  Nm Pet Image Restag (ps) Skull Base To Thigh  Result Date: 06/27/2016 CLINICAL DATA:  Subsequent treatment strategy for breast carcinoma with metastasis. EXAM: NUCLEAR MEDICINE PET SKULL BASE TO THIGH TECHNIQUE: 5.2 mCi F-18 FDG was injected intravenously. Full-ring PET imaging was performed from the skull base to thigh after  the radiotracer. CT data was obtained and used for attenuation correction and anatomic localization. FASTING BLOOD GLUCOSE:  Value: 100 mg/dl COMPARISON:  PET-CT 03/01/2016 FINDINGS: NECK No hypermetabolic lymph nodes in the neck. CHEST Hypermetabolic nodularity inferior lingula and LEFT lower lobe is decreased. The nodular focus at the LEFT lung base is resolved. Metabolic activity in the inferior lingula has improved with SUV max 4.1 decrease of 6.3. There is persistent loculated effusion and bibasilar atelectasis. No new pulmonary nodules. No hypermetabolic mediastinal lymph nodes. ABDOMEN/PELVIS The previous described liver lesions within the LEFT lateral hepatic lobe in the RIGHT hepatic lobe. Persistent lesion  in subcapsular RIGHT hepatic lobe (segment 8) with SUV max equal 4.3 compared with SUV max equal 4.9. Subtle hypodensity on the CT portion exam measuring 10 mm on image 90, series 4. There is misregistration in this region secondary to patient breathing. Adrenal glands normal. No hypermetabolic abdominopelvic lymph nodes. Uterus and ovaries are normal. SKELETON Overall improvement in skeletal metastasis. For example diffuse activity in the pelvis is decreased. For example activity in the posterior LEFT iliac bone with SUV max equals 6.3 decreased from 11.0. Metabolic lesion at L5 SUV max 10.6 is similar to 10.6. Decrease in metabolic activity through thoracic spine with SUV max equal 3 point 9 decreased from 9.6. IMPRESSION: 1. Overall mild improvement of multifocal metastatic disease. 2. Improvement in metabolic nodularity at the LEFT lung base. 3. Improvement in hepatic metastasis. 4. Overall improvement skeletal metastasis. Multiple foci of intense hypermetabolic activity remain within the skeleton remain. Electronically Signed   By: Suzy Bouchard M.D.   On: 06/27/2016 13:08     PATHOLOGY:    ASSESSMENT AND PLAN:  Metastatic breast cancer ([facility]) Stage IV breast cancer, ER/PR+ of left breast  with bone metastases requiring vertebroplasty and kyphoplasty, heavily pre-treated disease.  Complicated by chemotherapy induced anemia and neutropenia.  On Xgeva for bone metastases.  Now on Halaven days 1, 8 every 21 days (with Neupogen support) beginning on 03/11/2016.  Oncology history is updated.  She is here to review her PET imaging.  I personally reviewed and went over radiographic studies with the patient.  The results are noted within this dictation.  PET scan demonstrates improvement in disease, overall.  Pre-treatment labs as ordered day 1 of treatment: CBC diff, CMET, CA 27.29, and CA 15-3  She is scheduled for Xgeva on 07/05/2016.  She will get Neupogen that day too.  She is scheduled for pneumovax on 07/08/2016.  She will be due for treatment on that day too for Day 8.  She will return on 12/22 for day 1 of treatment followed by Neupogen on 12/26 and 12/27.  She is tolerating treatment well and is not interested in an adjustment in her treatment for Christmas.  We will stay on schedule as a result.  Return for follow-up on day 1 of next cycle (~ 3 weeks).   ORDERS PLACED FOR THIS ENCOUNTER: Orders Placed This Encounter  Procedures  . Cancer antigen 27.29  . Cancer antigen 15-3    MEDICATIONS PRESCRIBED THIS ENCOUNTER: No orders of the defined types were placed in this encounter.   THERAPY PLAN:  Continue with Halaven with close monitoring for progression of disease.  All questions were answered. The patient knows to call the clinic with any problems, questions or concerns. We can certainly see the patient much sooner if necessary.  Patient and plan discussed with Dr. Ancil Linsey and she is in agreement with the aforementioned.   This note is electronically signed by: Doy Mince 06/28/2016 5:20 PM

## 2016-07-01 ENCOUNTER — Encounter (HOSPITAL_BASED_OUTPATIENT_CLINIC_OR_DEPARTMENT_OTHER): Payer: Medicare Other

## 2016-07-01 VITALS — BP 110/62 | HR 92 | Temp 97.4°F | Resp 18 | Wt 100.0 lb

## 2016-07-01 DIAGNOSIS — C7951 Secondary malignant neoplasm of bone: Secondary | ICD-10-CM

## 2016-07-01 DIAGNOSIS — C50919 Malignant neoplasm of unspecified site of unspecified female breast: Secondary | ICD-10-CM

## 2016-07-01 DIAGNOSIS — C799 Secondary malignant neoplasm of unspecified site: Secondary | ICD-10-CM | POA: Diagnosis not present

## 2016-07-01 DIAGNOSIS — D702 Other drug-induced agranulocytosis: Secondary | ICD-10-CM

## 2016-07-01 LAB — CBC WITH DIFFERENTIAL/PLATELET
BASOS ABS: 0 10*3/uL (ref 0.0–0.1)
BASOS PCT: 1 %
EOS ABS: 0 10*3/uL (ref 0.0–0.7)
EOS PCT: 0 %
HCT: 32.7 % — ABNORMAL LOW (ref 36.0–46.0)
Hemoglobin: 10.5 g/dL — ABNORMAL LOW (ref 12.0–15.0)
Lymphocytes Relative: 24 %
Lymphs Abs: 0.8 10*3/uL (ref 0.7–4.0)
MCH: 31.7 pg (ref 26.0–34.0)
MCHC: 32.1 g/dL (ref 30.0–36.0)
MCV: 98.8 fL (ref 78.0–100.0)
MONO ABS: 0.9 10*3/uL (ref 0.1–1.0)
Monocytes Relative: 27 %
Neutro Abs: 1.5 10*3/uL — ABNORMAL LOW (ref 1.7–7.7)
Neutrophils Relative %: 48 %
PLATELETS: 288 10*3/uL (ref 150–400)
RBC: 3.31 MIL/uL — AB (ref 3.87–5.11)
RDW: 18.2 % — AB (ref 11.5–15.5)
WBC: 3.2 10*3/uL — AB (ref 4.0–10.5)

## 2016-07-01 LAB — COMPREHENSIVE METABOLIC PANEL
ALBUMIN: 3.8 g/dL (ref 3.5–5.0)
ALT: 10 U/L — AB (ref 14–54)
AST: 25 U/L (ref 15–41)
Alkaline Phosphatase: 55 U/L (ref 38–126)
Anion gap: 7 (ref 5–15)
BUN: 13 mg/dL (ref 6–20)
CHLORIDE: 105 mmol/L (ref 101–111)
CO2: 24 mmol/L (ref 22–32)
Calcium: 8.8 mg/dL — ABNORMAL LOW (ref 8.9–10.3)
Creatinine, Ser: 0.91 mg/dL (ref 0.44–1.00)
GFR calc Af Amer: >60 mL/min (ref >60)
GFR calc non Af Amer: >60 mL/min (ref >60)
Glucose, Bld: 113 mg/dL — ABNORMAL HIGH (ref 65–99)
POTASSIUM: 3.6 mmol/L (ref 3.5–5.1)
SODIUM: 136 mmol/L (ref 135–145)
Total Bilirubin: 0.7 mg/dL (ref 0.3–1.2)
Total Protein: 6.8 g/dL (ref 6.5–8.1)

## 2016-07-01 MED ORDER — PROCHLORPERAZINE MALEATE 10 MG PO TABS
10.0000 mg | ORAL_TABLET | Freq: Once | ORAL | Status: AC
  Administered 2016-07-01: 10 mg via ORAL

## 2016-07-01 MED ORDER — HEPARIN SOD (PORK) LOCK FLUSH 100 UNIT/ML IV SOLN
500.0000 [IU] | Freq: Once | INTRAVENOUS | Status: AC | PRN
  Administered 2016-07-01: 500 [IU]

## 2016-07-01 MED ORDER — ONDANSETRON HCL 4 MG PO TABS
8.0000 mg | ORAL_TABLET | Freq: Once | ORAL | Status: AC
  Administered 2016-07-01: 8 mg via ORAL

## 2016-07-01 MED ORDER — SODIUM CHLORIDE 0.9 % IV SOLN
Freq: Once | INTRAVENOUS | Status: AC
  Administered 2016-07-01: 13:00:00 via INTRAVENOUS

## 2016-07-01 MED ORDER — SODIUM CHLORIDE 0.9% FLUSH
10.0000 mL | INTRAVENOUS | Status: DC | PRN
  Administered 2016-07-01: 10 mL
  Filled 2016-07-01: qty 10

## 2016-07-01 MED ORDER — SODIUM CHLORIDE 0.9 % IV SOLN
1.4000 mg/m2 | Freq: Once | INTRAVENOUS | Status: AC
  Administered 2016-07-01: 2 mg via INTRAVENOUS
  Filled 2016-07-01: qty 4

## 2016-07-01 MED ORDER — PROCHLORPERAZINE MALEATE 10 MG PO TABS
ORAL_TABLET | ORAL | Status: AC
  Filled 2016-07-01: qty 1

## 2016-07-01 MED ORDER — ONDANSETRON HCL 4 MG PO TABS
ORAL_TABLET | ORAL | Status: AC
  Filled 2016-07-01: qty 2

## 2016-07-01 NOTE — Progress Notes (Signed)
Keleigh Stbernard Bisson returning 12/26 and 12/27 for Neupogen injections

## 2016-07-01 NOTE — Progress Notes (Signed)
Debbie Reyes tolerated chemo tx well without complaints or incident. Labs reviewed with Kirby Crigler PA prior to administering chemotherapy.VSS upon discharge. Pt reinforced to call for any fever or chills and verbalized understanding. Pt discharged self ambulatory in satisfactory condition

## 2016-07-01 NOTE — Patient Instructions (Signed)
[facility] Discharge Instructions for Patients Receiving Chemotherapy   Beginning January 23rd 2017 lab work for the [facility] will be done in the  Main lab at [facility] on 1st floor. If you have a lab appointment with the [place] please come in thru the  Main Entrance and check in at the main information desk   Today you received the following chemotherapy agents Halaven. Follow-up as scheduled. Call clinic for any questions or concerns  To help prevent nausea and vomiting after your treatment, we encourage you to take your nausea medication   If you develop nausea and vomiting, or diarrhea that is not controlled by your medication, call the clinic.  The clinic phone number is [phone]. Office hours are Monday-Friday 8:30am-5:00pm.  BELOW ARE SYMPTOMS THAT SHOULD BE REPORTED IMMEDIATELY:  *FEVER GREATER THAN 101.0 F  *CHILLS WITH OR WITHOUT FEVER  NAUSEA AND VOMITING THAT IS NOT CONTROLLED WITH YOUR NAUSEA MEDICATION  *UNUSUAL SHORTNESS OF BREATH  *UNUSUAL BRUISING OR BLEEDING  TENDERNESS IN MOUTH AND THROAT WITH OR WITHOUT PRESENCE OF ULCERS  *URINARY PROBLEMS  *BOWEL PROBLEMS  UNUSUAL RASH Items with * indicate a potential emergency and should be followed up as soon as possible. If you have an emergency after office hours please contact your primary care physician or go to the nearest emergency department.  Please call the clinic during office hours if you have any questions or concerns.   You may also contact the Patient Navigator at [phone] should you have any questions or need assistance in obtaining follow up care.      Resources For Cancer Patients and their Caregivers ? American Cancer Society: Can assist with transportation, wigs, general needs, runs Look Good Feel Better.        [phone] ? Cancer Care: Provides financial assistance, online support groups, medication/co-pay assistance.  1-800-813-HOPE  [phone]) ? [place] Assists [place] cancer patients and their families through emotional , educational and financial support.  850-499-6274 ? [place] DSS Where to apply for food stamps, Medicaid and utility assistance. ([phone] 308-5584 ? RCATS: Transportation to medical appointments. 709-201-3340 ? Social Security Administration: May apply for disability if have a Stage IV cancer. [phone] ? [employer], Disability and Transit Services: Assists with nutrition, care and transit needs. [phone]

## 2016-07-02 LAB — CANCER ANTIGEN 27.29: CA 27.29: 313.1 U/mL — AB (ref 0.0–38.6)

## 2016-07-02 LAB — CANCER ANTIGEN 15-3: CA 15-3: 207.4 U/mL — ABNORMAL HIGH (ref 0.0–25.0)

## 2016-07-05 ENCOUNTER — Encounter (HOSPITAL_BASED_OUTPATIENT_CLINIC_OR_DEPARTMENT_OTHER): Payer: Medicare Other

## 2016-07-05 ENCOUNTER — Encounter (HOSPITAL_COMMUNITY): Payer: Medicare Other

## 2016-07-05 ENCOUNTER — Encounter (HOSPITAL_COMMUNITY): Payer: Self-pay

## 2016-07-05 VITALS — BP 102/64 | HR 101 | Temp 97.6°F | Resp 18

## 2016-07-05 DIAGNOSIS — D701 Agranulocytosis secondary to cancer chemotherapy: Secondary | ICD-10-CM | POA: Diagnosis present

## 2016-07-05 DIAGNOSIS — D702 Other drug-induced agranulocytosis: Secondary | ICD-10-CM

## 2016-07-05 DIAGNOSIS — C50919 Malignant neoplasm of unspecified site of unspecified female breast: Secondary | ICD-10-CM | POA: Diagnosis not present

## 2016-07-05 DIAGNOSIS — C799 Secondary malignant neoplasm of unspecified site: Secondary | ICD-10-CM | POA: Diagnosis not present

## 2016-07-05 DIAGNOSIS — C7951 Secondary malignant neoplasm of bone: Secondary | ICD-10-CM

## 2016-07-05 LAB — COMPREHENSIVE METABOLIC PANEL
ALBUMIN: 3.9 g/dL (ref 3.5–5.0)
ALK PHOS: 56 U/L (ref 38–126)
ALT: 11 U/L — AB (ref 14–54)
ANION GAP: 9 (ref 5–15)
AST: 29 U/L (ref 15–41)
BUN: 13 mg/dL (ref 6–20)
CALCIUM: 9.6 mg/dL (ref 8.9–10.3)
CO2: 27 mmol/L (ref 22–32)
Chloride: 99 mmol/L — ABNORMAL LOW (ref 101–111)
Creatinine, Ser: 0.85 mg/dL (ref 0.44–1.00)
GFR calc Af Amer: >60 mL/min (ref >60)
GFR calc non Af Amer: >60 mL/min (ref >60)
GLUCOSE: 131 mg/dL — AB (ref 65–99)
Potassium: 3.6 mmol/L (ref 3.5–5.1)
SODIUM: 135 mmol/L (ref 135–145)
Total Bilirubin: 0.7 mg/dL (ref 0.3–1.2)
Total Protein: 6.9 g/dL (ref 6.5–8.1)

## 2016-07-05 MED ORDER — DENOSUMAB 120 MG/1.7ML ~~LOC~~ SOLN
120.0000 mg | Freq: Once | SUBCUTANEOUS | Status: AC
  Administered 2016-07-05: 120 mg via SUBCUTANEOUS
  Filled 2016-07-05: qty 1.7

## 2016-07-05 MED ORDER — FILGRASTIM 300 MCG/0.5ML IJ SOSY
300.0000 ug | PREFILLED_SYRINGE | Freq: Once | INTRAMUSCULAR | Status: AC
  Administered 2016-07-05: 300 ug via SUBCUTANEOUS
  Filled 2016-07-05: qty 0.5

## 2016-07-05 NOTE — Patient Instructions (Signed)
[place] at [facility]  Discharge Instructions:  Delton See and Neupogen injections today Follow up for injections tomorrow as scheduled. _______________________________________________________________  Thank you for choosing [place] at [facility] to provide your oncology and hematology care.  To afford each patient quality time with our providers, please arrive at least 15 minutes before your scheduled appointment.  You need to re-schedule your appointment if you arrive 10 or more minutes late.  We strive to give you quality time with our providers, and arriving late affects you and other patients whose appointments are after yours.  Also, if you no show three or more times for appointments you may be dismissed from the clinic.  Again, thank you for choosing [place] at [place] hope is that these requests will allow you access to exceptional care and in a timely manner. _______________________________________________________________  If you have questions after your visit, please contact our office at [phone] between the hours of 8:30 a.m. and 5:00 p.m. Voicemails left after 4:30 p.m. will not be returned until the following business day. _______________________________________________________________  For prescription refill requests, have your pharmacy contact our office. _______________________________________________________________  Recommendations made by the consultant and any test results will be sent to your referring physician. _______________________________________________________________

## 2016-07-05 NOTE — Progress Notes (Signed)
Debbie Reyes presents today for injection per MD orders. Neupogen 370mcg administered SQ in right Abdomen. Administration without incident. Patient tolerated well.   Debbie Reyes presents today for injection per MD orders. Xgeva 120mg  administered SQ in left Abdomen. Administration without incident. Patient tolerated well.  Patient VS stable, discharged ambulatory from clinic, follow up tomorrow as scheduled.

## 2016-07-06 ENCOUNTER — Telehealth (HOSPITAL_COMMUNITY): Payer: Self-pay | Admitting: *Deleted

## 2016-07-06 ENCOUNTER — Encounter (HOSPITAL_COMMUNITY): Payer: Self-pay

## 2016-07-06 ENCOUNTER — Encounter (HOSPITAL_BASED_OUTPATIENT_CLINIC_OR_DEPARTMENT_OTHER): Payer: Medicare Other

## 2016-07-06 VITALS — BP 99/55 | HR 108 | Temp 97.5°F | Resp 18

## 2016-07-06 DIAGNOSIS — C50919 Malignant neoplasm of unspecified site of unspecified female breast: Secondary | ICD-10-CM | POA: Diagnosis not present

## 2016-07-06 DIAGNOSIS — D702 Other drug-induced agranulocytosis: Secondary | ICD-10-CM

## 2016-07-06 DIAGNOSIS — D701 Agranulocytosis secondary to cancer chemotherapy: Secondary | ICD-10-CM | POA: Diagnosis present

## 2016-07-06 MED ORDER — FILGRASTIM 300 MCG/0.5ML IJ SOSY
300.0000 ug | PREFILLED_SYRINGE | Freq: Once | INTRAMUSCULAR | Status: AC
  Administered 2016-07-06: 300 ug via SUBCUTANEOUS
  Filled 2016-07-06: qty 0.5

## 2016-07-06 NOTE — Telephone Encounter (Signed)
Pt aware that per Dr. Whitney Muse she can be seen at the dentist. Pt states that she had already been to the dentist this morning and that she needed to know that before 11:00 this morning.   I apologized to the pt and informed her that there was not a time put in the note that she needed to know by.

## 2016-07-06 NOTE — Progress Notes (Signed)
Debbie Reyes presents today for injection per the provider's orders.  Neupogen administration without incident; see MAR for injection details.  Patient tolerated procedure well and without incident.  No questions or complaints noted at this time.

## 2016-07-06 NOTE — Patient Instructions (Signed)
[place] at [facility] Discharge Instructions  RECOMMENDATIONS MADE BY THE CONSULTANT AND ANY TEST RESULTS WILL BE SENT TO YOUR REFERRING PHYSICIAN.  Neupogen injection today. Return as scheduled for treatment.   Thank you for choosing [place] at [facility] to provide your oncology and hematology care.  To afford each patient quality time with our provider, please arrive at least 15 minutes before your scheduled appointment time.   Beginning January 23rd 2017 lab work for the [facility] will be done in the  Main lab at [facility] on 1st floor. If you have a lab appointment with the [place] please come in thru the  Main Entrance and check in at the main information desk  You need to re-schedule your appointment should you arrive 10 or more minutes late.  We strive to give you quality time with our providers, and arriving late affects you and other patients whose appointments are after yours.  Also, if you no show three or more times for appointments you may be dismissed from the clinic at the providers discretion.     Again, thank you for choosing [facility].  Our hope is that these requests will decrease the amount of time that you wait before being seen by our physicians.       _____________________________________________________________  Should you have questions after your visit to [facility], please contact our office at [phone] between the hours of 8:30 a.m. and 4:30 p.m.  Voicemails left after 4:30 p.m. will not be returned until the following business day.  For prescription refill requests, have your pharmacy contact our office.         Resources For Cancer Patients and their Caregivers ? American Cancer Society: Can assist with transportation, wigs, general needs, runs Look Good Feel Better.        856-622-5395 ? Cancer Care: Provides financial assistance, online support groups,  medication/co-pay assistance.  1-800-813-HOPE 718-581-6036) ? [place] Assists [place] cancer patients and their families through emotional , educational and financial support.  938-766-2976 ? [place] DSS Where to apply for food stamps, Medicaid and utility assistance. 938-591-0795 ? RCATS: Transportation to medical appointments. 979-633-2490 ? Social Security Administration: May apply for disability if have a Stage IV cancer. [phone] 618-125-5953 ? [employer], Disability and Transit Services: Assists with nutrition, care and transit needs. [place] Support Programs: @10RELATIVEDAYS @ > Cancer Support Group  2nd Tuesday of the month 1pm-2pm, Journey Room  > Creative Journey  3rd Tuesday of the month 1130am-1pm, Journey Room  > Look Good Feel Better  1st Wednesday of the month 10am-12 noon, Journey Room (Call [place] to register [phone])

## 2016-07-08 ENCOUNTER — Encounter (HOSPITAL_COMMUNITY): Payer: Medicare Other

## 2016-07-08 ENCOUNTER — Other Ambulatory Visit: Payer: Self-pay

## 2016-07-08 ENCOUNTER — Other Ambulatory Visit (HOSPITAL_COMMUNITY): Payer: Self-pay | Admitting: Pharmacist

## 2016-07-08 ENCOUNTER — Encounter (HOSPITAL_BASED_OUTPATIENT_CLINIC_OR_DEPARTMENT_OTHER): Payer: Medicare Other

## 2016-07-08 VITALS — BP 102/56 | HR 98 | Temp 98.2°F | Resp 18

## 2016-07-08 DIAGNOSIS — I959 Hypotension, unspecified: Secondary | ICD-10-CM | POA: Insufficient documentation

## 2016-07-08 DIAGNOSIS — C50919 Malignant neoplasm of unspecified site of unspecified female breast: Secondary | ICD-10-CM

## 2016-07-08 DIAGNOSIS — C799 Secondary malignant neoplasm of unspecified site: Secondary | ICD-10-CM | POA: Diagnosis not present

## 2016-07-08 DIAGNOSIS — Z0000 Encounter for general adult medical examination without abnormal findings: Secondary | ICD-10-CM | POA: Insufficient documentation

## 2016-07-08 DIAGNOSIS — I950 Idiopathic hypotension: Secondary | ICD-10-CM

## 2016-07-08 DIAGNOSIS — C7951 Secondary malignant neoplasm of bone: Secondary | ICD-10-CM

## 2016-07-08 DIAGNOSIS — Z5111 Encounter for antineoplastic chemotherapy: Secondary | ICD-10-CM | POA: Diagnosis not present

## 2016-07-08 DIAGNOSIS — R000 Tachycardia, unspecified: Secondary | ICD-10-CM

## 2016-07-08 DIAGNOSIS — Z23 Encounter for immunization: Secondary | ICD-10-CM

## 2016-07-08 DIAGNOSIS — D702 Other drug-induced agranulocytosis: Secondary | ICD-10-CM

## 2016-07-08 LAB — CBC WITH DIFFERENTIAL/PLATELET
Basophils Absolute: 0 10*3/uL (ref 0.0–0.1)
Basophils Relative: 0 %
EOS ABS: 0 10*3/uL (ref 0.0–0.7)
Eosinophils Relative: 0 %
HCT: 31.1 % — ABNORMAL LOW (ref 36.0–46.0)
HEMOGLOBIN: 10 g/dL — AB (ref 12.0–15.0)
LYMPHS PCT: 10 %
Lymphs Abs: 1.4 10*3/uL (ref 0.7–4.0)
MCH: 31.6 pg (ref 26.0–34.0)
MCHC: 32.2 g/dL (ref 30.0–36.0)
MCV: 98.4 fL (ref 78.0–100.0)
MONO ABS: 1.9 10*3/uL — AB (ref 0.1–1.0)
Monocytes Relative: 13 %
NEUTROS PCT: 77 %
Neutro Abs: 11.1 10*3/uL — ABNORMAL HIGH (ref 1.7–7.7)
PLATELETS: 253 10*3/uL (ref 150–400)
RBC: 3.16 MIL/uL — AB (ref 3.87–5.11)
RDW: 18 % — ABNORMAL HIGH (ref 11.5–15.5)
WBC: 14.4 10*3/uL — AB (ref 4.0–10.5)

## 2016-07-08 LAB — COMPREHENSIVE METABOLIC PANEL
ALBUMIN: 3.8 g/dL (ref 3.5–5.0)
ALK PHOS: 70 U/L (ref 38–126)
ALT: 11 U/L — ABNORMAL LOW (ref 14–54)
AST: 24 U/L (ref 15–41)
Anion gap: 7 (ref 5–15)
BUN: 15 mg/dL (ref 6–20)
CALCIUM: 9.3 mg/dL (ref 8.9–10.3)
CHLORIDE: 101 mmol/L (ref 101–111)
CO2: 27 mmol/L (ref 22–32)
CREATININE: 0.94 mg/dL (ref 0.44–1.00)
GFR calc Af Amer: >60 mL/min (ref >60)
GFR calc non Af Amer: 58 mL/min — ABNORMAL LOW (ref >60)
GLUCOSE: 118 mg/dL — AB (ref 65–99)
Potassium: 3.3 mmol/L — ABNORMAL LOW (ref 3.5–5.1)
SODIUM: 135 mmol/L (ref 135–145)
Total Bilirubin: 0.6 mg/dL (ref 0.3–1.2)
Total Protein: 6.7 g/dL (ref 6.5–8.1)

## 2016-07-08 MED ORDER — ONDANSETRON HCL 4 MG PO TABS: 8.0000 mg | ORAL_TABLET | Freq: Once | ORAL | Status: DC

## 2016-07-08 MED ORDER — PNEUMOCOCCAL VAC POLYVALENT 25 MCG/0.5ML IJ INJ
0.5000 mL | INJECTION | Freq: Once | INTRAMUSCULAR | Status: AC
  Administered 2016-07-08: 0.5 mL via INTRAMUSCULAR
  Filled 2016-07-08: qty 0.5

## 2016-07-08 MED ORDER — SODIUM CHLORIDE 0.9 % IV SOLN: Freq: Once | INTRAVENOUS | Status: DC

## 2016-07-08 MED ORDER — HEPARIN SOD (PORK) LOCK FLUSH 100 UNIT/ML IV SOLN
500.0000 [IU] | Freq: Once | INTRAVENOUS | Status: AC | PRN
  Administered 2016-07-08: 500 [IU]

## 2016-07-08 MED ORDER — PROPRANOLOL HCL 10 MG PO TABS
10.0000 mg | ORAL_TABLET | Freq: Once | ORAL | Status: DC
  Filled 2016-07-08: qty 1

## 2016-07-08 MED ORDER — SODIUM CHLORIDE 0.9% FLUSH: 10.0000 mL | INTRAVENOUS | Status: DC | PRN

## 2016-07-08 MED ORDER — SODIUM CHLORIDE 0.9 % IV SOLN
1.4000 mg/m2 | Freq: Once | INTRAVENOUS | Status: AC
  Administered 2016-07-08: 2 mg via INTRAVENOUS
  Filled 2016-07-08: qty 4

## 2016-07-08 MED ORDER — POTASSIUM CHLORIDE CRYS ER 20 MEQ PO TBCR: 10.0000 meq | EXTENDED_RELEASE_TABLET | Freq: Two times a day (BID) | ORAL | 0 refills | Status: DC

## 2016-07-08 MED ORDER — SODIUM CHLORIDE 0.9 % IV SOLN
INTRAVENOUS | Status: DC
  Administered 2016-07-08: 11:00:00 via INTRAVENOUS

## 2016-07-08 MED ORDER — PROCHLORPERAZINE MALEATE 10 MG PO TABS: 10.0000 mg | ORAL_TABLET | Freq: Once | ORAL | Status: DC

## 2016-07-08 NOTE — Progress Notes (Signed)
Please see other encounter for documentation.   

## 2016-07-08 NOTE — Patient Instructions (Signed)
[facility] Discharge Instructions for Patients Receiving Chemotherapy   Beginning January 23rd 2017 lab work for the [facility] will be done in the  Main lab at [facility] on 1st floor. If you have a lab appointment with the [place] please come in thru the  Main Entrance and check in at the main information desk   Today you received the following chemotherapy agent: Halaven. Pneumonia vaccine given as well.     If you develop nausea and vomiting, or diarrhea that is not controlled by your medication, call the clinic.  The clinic phone number is [phone]. Office hours are Monday-Friday 8:30am-5:00pm.  BELOW ARE SYMPTOMS THAT SHOULD BE REPORTED IMMEDIATELY:  *FEVER GREATER THAN 101.0 F  *CHILLS WITH OR WITHOUT FEVER  NAUSEA AND VOMITING THAT IS NOT CONTROLLED WITH YOUR NAUSEA MEDICATION  *UNUSUAL SHORTNESS OF BREATH  *UNUSUAL BRUISING OR BLEEDING  TENDERNESS IN MOUTH AND THROAT WITH OR WITHOUT PRESENCE OF ULCERS  *URINARY PROBLEMS  *BOWEL PROBLEMS  UNUSUAL RASH Items with * indicate a potential emergency and should be followed up as soon as possible. If you have an emergency after office hours please contact your primary care physician or go to the nearest emergency department.  Please call the clinic during office hours if you have any questions or concerns.   You may also contact the Patient Navigator at [phone] should you have any questions or need assistance in obtaining follow up care.      Resources For Cancer Patients and their Caregivers ? American Cancer Society: Can assist with transportation, wigs, general needs, runs Look Good Feel Better.        ([phone] ? Cancer Care: Provides financial assistance, online support groups, medication/co-pay assistance.  1-800-813-HOPE 531-421-6208) ? [place] Assists [place] cancer patients and their families through emotional , educational and  financial support.  937-449-2324 ? [place] DSS Where to apply for food stamps, Medicaid and utility assistance. ([phone] ? RCATS: Transportation to medical appointments. 519-318-8286 ? Social Security Administration: May apply for disability if have a Stage IV cancer. [phone] ? [employer], Disability and Transit Services: Assists with nutrition, care and transit needs. [phone]

## 2016-07-08 NOTE — Progress Notes (Signed)
**Note De-Identified  Obfuscation** EKG completed RN notified

## 2016-07-08 NOTE — Progress Notes (Signed)
Patient educated on her lab values to include her low potassium level.  Prescription sent to the [facility] in [place] for patient to start taking today.  Patient aware of this prescription and instructions and states that she will pick it up this afternoon.  Tolerated infusion well.  VSS, with an improving BP and pulse.  MD aware of vitals and instructs for the patient to push PO fluids as well as start taking her propranolol at home as prescribed, patient educated on this and verbalized understanding.  Pneumonia vaccine given IM in the right upper arm, patient tolerated well.  Patient ambulatory and stable upon discharge from the clinic.

## 2016-07-10 IMAGING — CT NM PET TUM IMG RESTAG (PS) SKULL BASE T - THIGH
8 series · 25 of 25 positions shown · non-contrast
Comparison: 05/04/2015

CLINICAL DATA: Subsequent treatment strategy for metastatic breast
carcinoma.

EXAM:
NUCLEAR MEDICINE PET SKULL BASE TO THIGH
TECHNIQUE: 5.8 mCi F-18 FDG was injected intravenously. Full-ring PET imaging
was performed from the skull base to thigh after the radiotracer. CT
data was obtained and used for attenuation correction and anatomic
localization.
FASTING BLOOD GLUCOSE:  Value: 49 mg/dl

[Series 3: pet sk_thigh ac · axial · 5.0mm · 4.07mm/px · z∈[-1158,-422]mm · 5 of 185 slices shown]
[im 1/185]
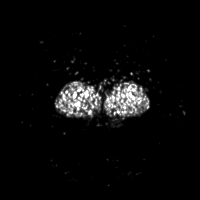
[im 47/185]
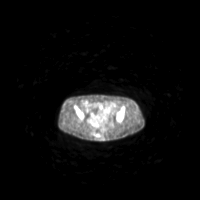
[im 93/185]
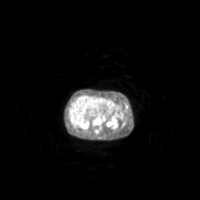
[im 139/185]
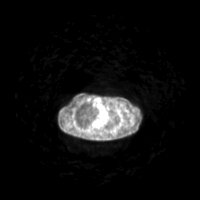
[im 185/185]
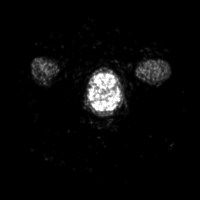

[Series 4: ct sk_thigh 5.0 b31f · axial · 5.0mm · 0.86mm/px · z∈[-1158,-422]mm · 5 of 185 slices shown]
[im 1/185]
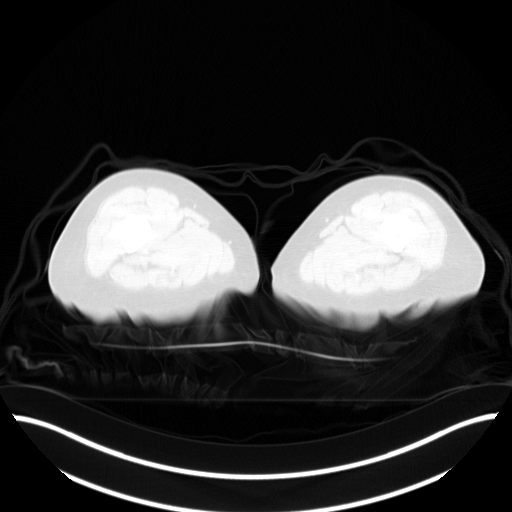
[im 47/185]
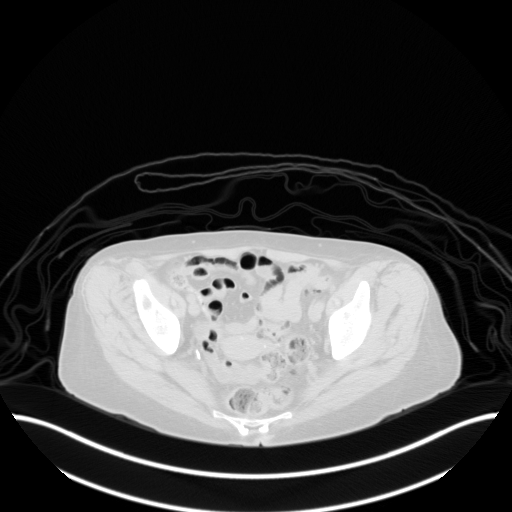
[im 93/185]
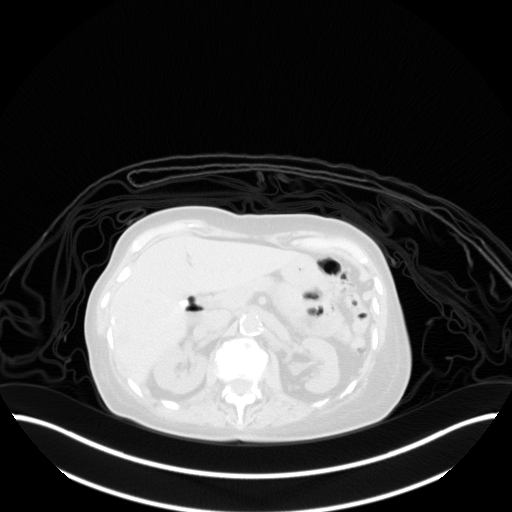
[im 139/185]
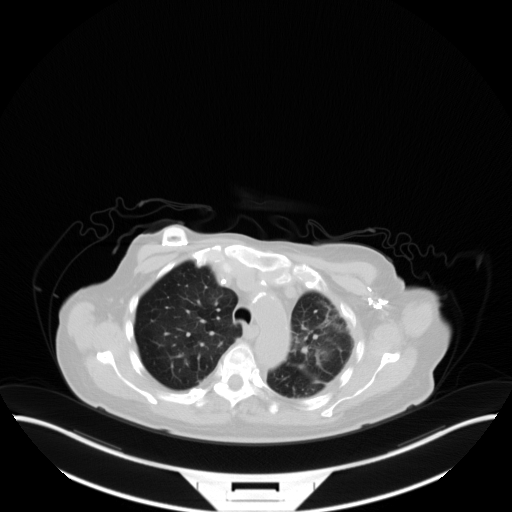
[im 185/185  brain]
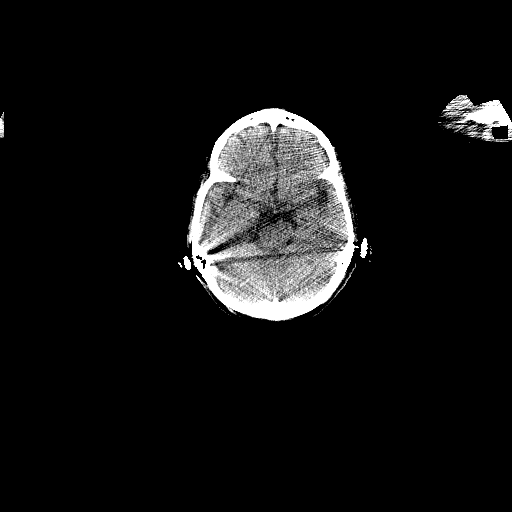

[Series 6: ct sk_thigh 5.0 b70f lung_bone · axial · 5.0mm · 0.54mm/px · 1 of 51 slices shown]
[im 1/51  bone]
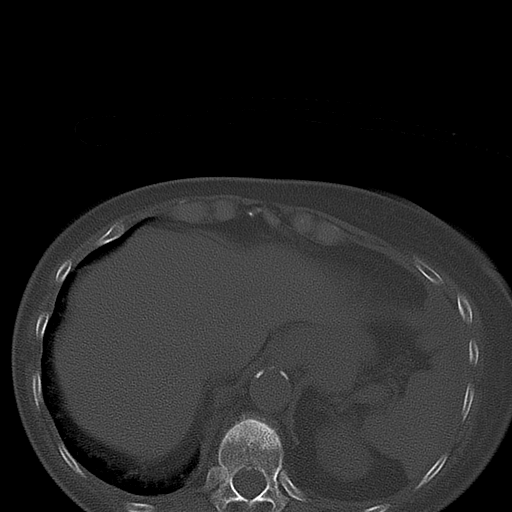

[Series 8: pet sk_thigh nac · axial · 5.0mm · 4.07mm/px · z∈[-1158,-422]mm · 5 of 185 slices shown]
[im 1/185]
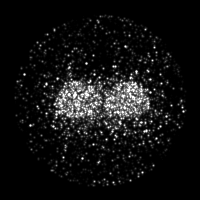
[im 47/185]
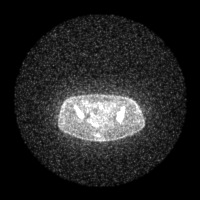
[im 93/185]
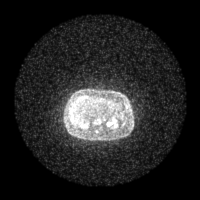
[im 139/185]
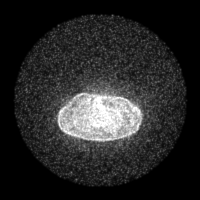
[im 185/185]
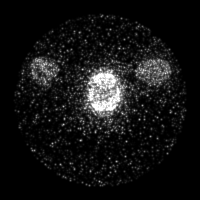

[Series 604: mip collection<mip range> · coronal · 1.68mm/px · 1 of 32 slices shown]
[im 1/32]
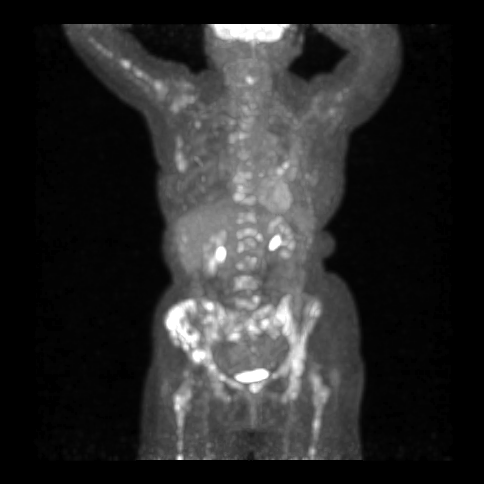

[Series 605: range-ct sk_thigh 5.0 (id)<alpha range> · 2 of 72 slices shown (1 of 2)]
[im 1/72]
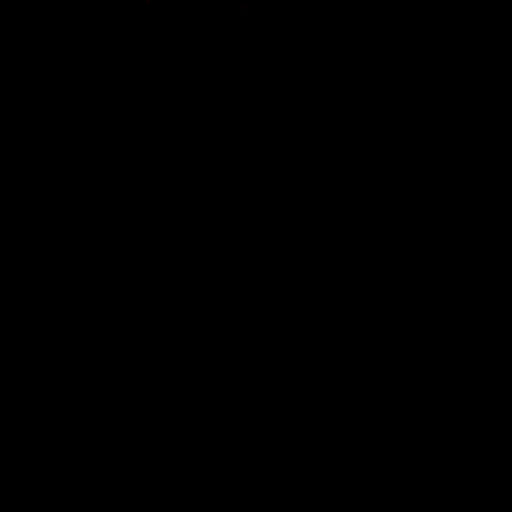
[im 72/72]
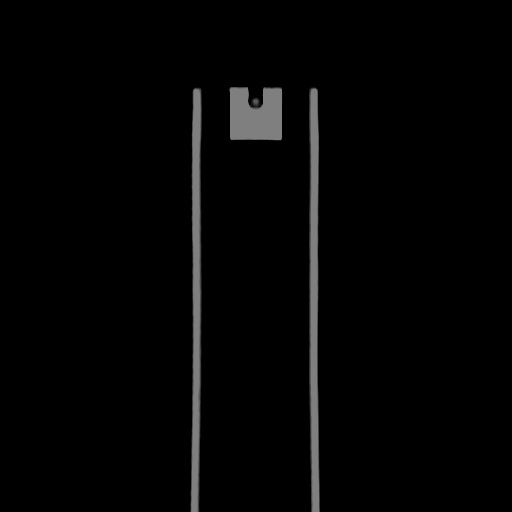

[Series 606: range-ct sk_thigh 5.0 (id)<alpha range> · 5 of 180 slices shown (2 of 2)]
[im 1/180]
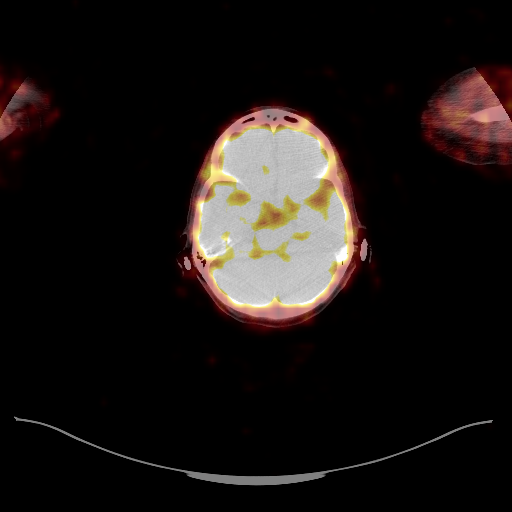
[im 45/180]
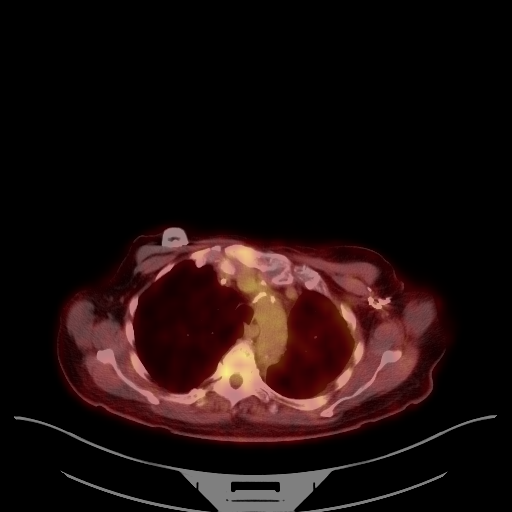
[im 90/180]
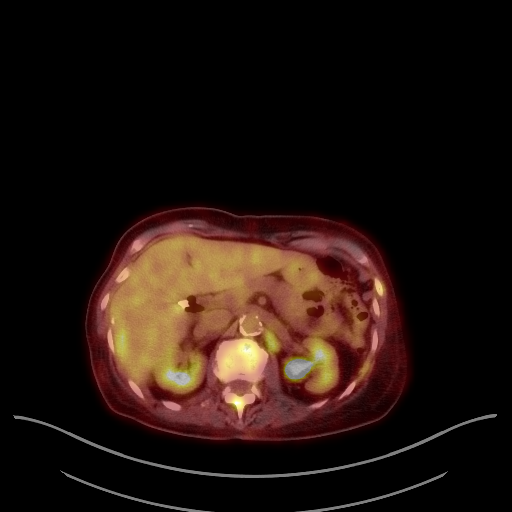
[im 135/180]
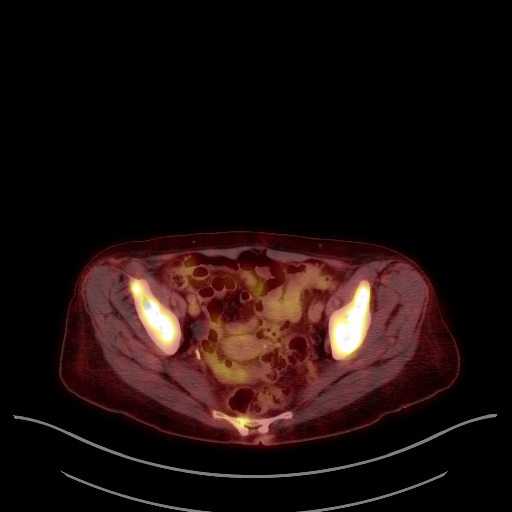
[im 180/180]
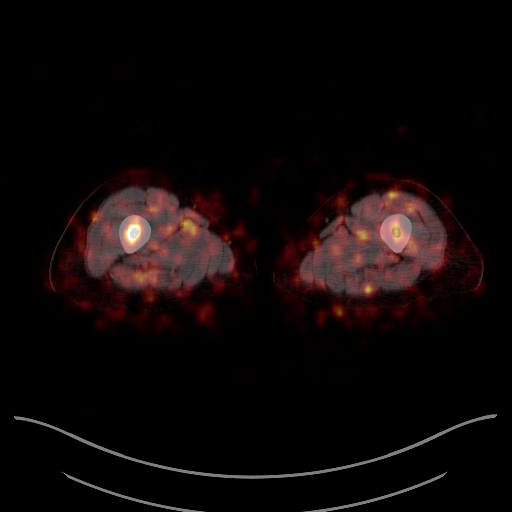

[Series 1630: results mm oncology reading · 0.73mm/px · 1 of 2 slices shown]
[im 1/2]
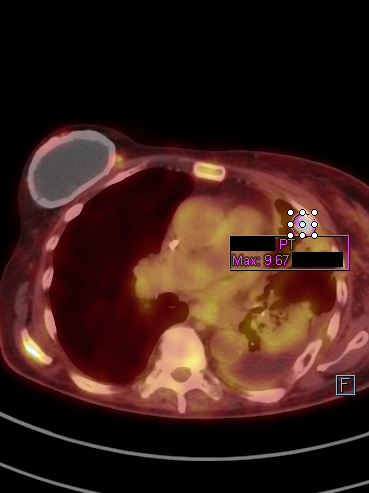

[25 of 25 positions shown; findings below may reference images not displayed]

FINDINGS: NECK

No hypermetabolic lymph nodes in the neck.

CHEST

No hypermetabolic mediastinal or hilar or axillary nodes. Moderate
left pleural effusion is again seen with associated are
hypermetabolic activity along the pleural surface, highly suspicious
for malignant pleural effusion. Left upper and lower lobe
atelectasis is again demonstrated which shows hypermetabolic
activity, but no distinct pulmonary nodules or masses are visualized
by CT.

ABDOMEN/PELVIS

A hypermetabolic focus seen in the posterior right hepatic lobe on
image 91 of series 3 and 4. This has an SUV max of 5.4 and
corresponds with a subtle low-attenuation lesion shown on CT. This
is new and suspicious for a liver metastasis. No abnormal
hypermetabolic activity within the pancreas, adrenal glands, or
spleen.

11 mm hypermetabolic retroperitoneal lymph node in the left
paraaortic region is stable in size this has SUV max of 9.4 compared
to 12.5 previously.

SKELETON

Diffuse hypermetabolic predominantly lytic bone lesions are again
seen throughout the skeleton within the neck, chest, abdomen, and
pelvis, consistent with diffuse bone metastases. The show no
significant change compared to prior study.
IMPRESSION: Suspect new hypermetabolic metastasis in posterior right hepatic
lobe. Consider abdomen MRI without and with contrast for further
evaluation.

No significant change in probable malignant left pleural effusion
and left lung atelectasis.

No significant change in diffuse osseous metastatic disease.

Stable hypermetabolic left abdominal paraaortic lymph node,
consistent with metastatic disease.

## 2016-07-12 ENCOUNTER — Telehealth (HOSPITAL_COMMUNITY): Payer: Self-pay | Admitting: *Deleted

## 2016-07-13 ENCOUNTER — Telehealth (HOSPITAL_COMMUNITY): Payer: Self-pay | Admitting: *Deleted

## 2016-07-13 ENCOUNTER — Other Ambulatory Visit (HOSPITAL_COMMUNITY): Payer: Self-pay | Admitting: *Deleted

## 2016-07-13 MED ORDER — AZITHROMYCIN 250 MG PO TABS: ORAL_TABLET | ORAL | 0 refills | Status: DC

## 2016-07-19 ENCOUNTER — Telehealth (HOSPITAL_COMMUNITY): Payer: Self-pay | Admitting: *Deleted

## 2016-07-19 ENCOUNTER — Ambulatory Visit (HOSPITAL_COMMUNITY): Payer: Medicare Other | Admitting: Oncology

## 2016-07-19 NOTE — Telephone Encounter (Signed)
Spoke with patient and she states that she finished the azithromycin on Sunday.  She is still congested, has productive cough, afebrile.  She feels like she is getting better but just wants to be sure that this will not affect her chemo scheduled for Friday this week.

## 2016-07-19 NOTE — Telephone Encounter (Signed)
Should not. Would call her in another Z-pak. Dr.P

## 2016-07-20 ENCOUNTER — Other Ambulatory Visit (HOSPITAL_COMMUNITY): Payer: Self-pay | Admitting: *Deleted

## 2016-07-20 MED ORDER — AZITHROMYCIN 250 MG PO TABS: ORAL_TABLET | ORAL | 0 refills | Status: DC

## 2016-07-20 NOTE — Telephone Encounter (Signed)
Azithromycin 250mg  refilled x 1 sent electronically to pharmacy per Dr. Whitney Muse.  Patient still needs to come for her appointment on Friday.  Patient did not answer phone this morning, left message for her to return call.

## 2016-07-22 ENCOUNTER — Ambulatory Visit (HOSPITAL_COMMUNITY)
Admission: RE | Admit: 2016-07-22 | Discharge: 2016-07-22 | Disposition: A | Discharge status: Home / Self Care | Payer: Medicare Other | Source: Ambulatory Visit | Attending: Hematology & Oncology | Admitting: Hematology & Oncology

## 2016-07-22 ENCOUNTER — Encounter (HOSPITAL_COMMUNITY): Payer: Medicare Other | Attending: Hematology & Oncology | Admitting: Hematology & Oncology

## 2016-07-22 ENCOUNTER — Encounter (HOSPITAL_BASED_OUTPATIENT_CLINIC_OR_DEPARTMENT_OTHER): Payer: Medicare Other

## 2016-07-22 ENCOUNTER — Encounter (HOSPITAL_COMMUNITY): Payer: Self-pay | Admitting: Hematology & Oncology

## 2016-07-22 VITALS — BP 108/57 | HR 110 | Temp 98.7°F | Resp 18 | Wt 95.0 lb

## 2016-07-22 DIAGNOSIS — C50912 Malignant neoplasm of unspecified site of left female breast: Secondary | ICD-10-CM | POA: Diagnosis present

## 2016-07-22 DIAGNOSIS — M8580 Other specified disorders of bone density and structure, unspecified site: Secondary | ICD-10-CM | POA: Diagnosis not present

## 2016-07-22 DIAGNOSIS — D701 Agranulocytosis secondary to cancer chemotherapy: Secondary | ICD-10-CM

## 2016-07-22 DIAGNOSIS — D6481 Anemia due to antineoplastic chemotherapy: Secondary | ICD-10-CM

## 2016-07-22 DIAGNOSIS — C50919 Malignant neoplasm of unspecified site of unspecified female breast: Secondary | ICD-10-CM

## 2016-07-22 DIAGNOSIS — R05 Cough: Secondary | ICD-10-CM | POA: Insufficient documentation

## 2016-07-22 DIAGNOSIS — C778 Secondary and unspecified malignant neoplasm of lymph nodes of multiple regions: Secondary | ICD-10-CM | POA: Diagnosis not present

## 2016-07-22 DIAGNOSIS — E538 Deficiency of other specified B group vitamins: Secondary | ICD-10-CM | POA: Diagnosis not present

## 2016-07-22 DIAGNOSIS — J209 Acute bronchitis, unspecified: Secondary | ICD-10-CM

## 2016-07-22 DIAGNOSIS — C7951 Secondary malignant neoplasm of bone: Secondary | ICD-10-CM | POA: Diagnosis not present

## 2016-07-22 DIAGNOSIS — Z170 Estrogen receptor positive status [ER+]: Secondary | ICD-10-CM | POA: Diagnosis not present

## 2016-07-22 DIAGNOSIS — C787 Secondary malignant neoplasm of liver and intrahepatic bile duct: Secondary | ICD-10-CM

## 2016-07-22 DIAGNOSIS — D702 Other drug-induced agranulocytosis: Secondary | ICD-10-CM

## 2016-07-22 DIAGNOSIS — C799 Secondary malignant neoplasm of unspecified site: Secondary | ICD-10-CM | POA: Diagnosis not present

## 2016-07-22 DIAGNOSIS — J90 Pleural effusion, not elsewhere classified: Secondary | ICD-10-CM | POA: Insufficient documentation

## 2016-07-22 DIAGNOSIS — Z5111 Encounter for antineoplastic chemotherapy: Secondary | ICD-10-CM

## 2016-07-22 DIAGNOSIS — J40 Bronchitis, not specified as acute or chronic: Secondary | ICD-10-CM

## 2016-07-22 LAB — CBC WITH DIFFERENTIAL/PLATELET
BASOS ABS: 0 10*3/uL (ref 0.0–0.1)
BASOS PCT: 1 %
Eosinophils Absolute: 0 10*3/uL (ref 0.0–0.7)
Eosinophils Relative: 0 %
HCT: 31.7 % — ABNORMAL LOW (ref 36.0–46.0)
Hemoglobin: 10.4 g/dL — ABNORMAL LOW (ref 12.0–15.0)
Lymphocytes Relative: 19 %
Lymphs Abs: 1.2 10*3/uL (ref 0.7–4.0)
MCH: 31.2 pg (ref 26.0–34.0)
MCHC: 32.8 g/dL (ref 30.0–36.0)
MCV: 95.2 fL (ref 78.0–100.0)
MONO ABS: 1.2 10*3/uL — AB (ref 0.1–1.0)
MONOS PCT: 19 %
NEUTROS ABS: 3.8 10*3/uL (ref 1.7–7.7)
Neutrophils Relative %: 61 %
PLATELETS: 333 10*3/uL (ref 150–400)
RBC: 3.33 MIL/uL — ABNORMAL LOW (ref 3.87–5.11)
RDW: 17.6 % — AB (ref 11.5–15.5)
WBC: 6.1 10*3/uL (ref 4.0–10.5)

## 2016-07-22 LAB — COMPREHENSIVE METABOLIC PANEL
ALBUMIN: 3.8 g/dL (ref 3.5–5.0)
ALT: 12 U/L — ABNORMAL LOW (ref 14–54)
ANION GAP: 7 (ref 5–15)
AST: 26 U/L (ref 15–41)
Alkaline Phosphatase: 51 U/L (ref 38–126)
BILIRUBIN TOTAL: 0.4 mg/dL (ref 0.3–1.2)
BUN: 13 mg/dL (ref 6–20)
CHLORIDE: 99 mmol/L — AB (ref 101–111)
CO2: 27 mmol/L (ref 22–32)
Calcium: 8.9 mg/dL (ref 8.9–10.3)
Creatinine, Ser: 0.86 mg/dL (ref 0.44–1.00)
GFR calc Af Amer: >60 mL/min (ref >60)
GFR calc non Af Amer: >60 mL/min (ref >60)
GLUCOSE: 102 mg/dL — AB (ref 65–99)
Potassium: 4 mmol/L (ref 3.5–5.1)
SODIUM: 133 mmol/L — AB (ref 135–145)
TOTAL PROTEIN: 6.8 g/dL (ref 6.5–8.1)

## 2016-07-22 MED ORDER — PROCHLORPERAZINE MALEATE 10 MG PO TABS
10.0000 mg | ORAL_TABLET | Freq: Once | ORAL | Status: AC
  Administered 2016-07-22: 10 mg via ORAL

## 2016-07-22 MED ORDER — IPRATROPIUM-ALBUTEROL 0.5-2.5 (3) MG/3ML IN SOLN
3.0000 mL | Freq: Once | RESPIRATORY_TRACT | Status: AC
  Administered 2016-07-22: 3 mL via RESPIRATORY_TRACT
  Filled 2016-07-22: qty 3

## 2016-07-22 MED ORDER — ONDANSETRON 8 MG PO TBDP
ORAL_TABLET | ORAL | Status: AC
  Filled 2016-07-22: qty 1

## 2016-07-22 MED ORDER — SODIUM CHLORIDE 0.9 % IV SOLN
Freq: Once | INTRAVENOUS | Status: AC
  Administered 2016-07-22: 15:00:00 via INTRAVENOUS

## 2016-07-22 MED ORDER — LEVOFLOXACIN 500 MG PO TABS: 500.0000 mg | ORAL_TABLET | Freq: Every day | ORAL | 0 refills | Status: DC

## 2016-07-22 MED ORDER — HEPARIN SOD (PORK) LOCK FLUSH 100 UNIT/ML IV SOLN
500.0000 [IU] | Freq: Once | INTRAVENOUS | Status: AC | PRN
  Administered 2016-07-22: 500 [IU]

## 2016-07-22 MED ORDER — ONDANSETRON 8 MG PO TBDP
8.0000 mg | ORAL_TABLET | Freq: Once | ORAL | Status: AC
  Administered 2016-07-22: 8 mg via ORAL

## 2016-07-22 MED ORDER — AEROCHAMBER MV MISC: 0 refills | Status: AC

## 2016-07-22 MED ORDER — SODIUM CHLORIDE 0.9 % IV SOLN
1.4000 mg/m2 | Freq: Once | INTRAVENOUS | Status: AC
  Administered 2016-07-22: 2 mg via INTRAVENOUS
  Filled 2016-07-22: qty 4

## 2016-07-22 MED ORDER — ALBUTEROL SULFATE HFA 108 (90 BASE) MCG/ACT IN AERS: 2.0000 | INHALATION_SPRAY | Freq: Two times a day (BID) | RESPIRATORY_TRACT | 0 refills | Status: AC

## 2016-07-22 MED ORDER — PREDNISONE 10 MG PO TABS: ORAL_TABLET | ORAL | 0 refills | Status: DC

## 2016-07-22 MED ORDER — ONDANSETRON HCL 4 MG PO TABS: 8.0000 mg | ORAL_TABLET | Freq: Once | ORAL | Status: DC

## 2016-07-22 MED ORDER — HEPARIN SOD (PORK) LOCK FLUSH 100 UNIT/ML IV SOLN
INTRAVENOUS | Status: AC
  Filled 2016-07-22: qty 5

## 2016-07-22 MED ORDER — PROCHLORPERAZINE MALEATE 10 MG PO TABS
ORAL_TABLET | ORAL | Status: AC
  Filled 2016-07-22: qty 1

## 2016-07-22 NOTE — Patient Instructions (Signed)
[facility] Discharge Instructions for Patients Receiving Chemotherapy   Beginning January 23rd 2017 lab work for the [facility] will be done in the  Main lab at [facility] on 1st floor. If you have a lab appointment with the [place] please come in thru the  Main Entrance and check in at the main information desk   Today you received the following chemotherapy agents:  Halaven  If you develop nausea and vomiting, or diarrhea that is not controlled by your medication, call the clinic.  The clinic phone number is [phone]. Office hours are Monday-Friday 8:30am-5:00pm.  BELOW ARE SYMPTOMS THAT SHOULD BE REPORTED IMMEDIATELY:  *FEVER GREATER THAN 101.0 F  *CHILLS WITH OR WITHOUT FEVER  NAUSEA AND VOMITING THAT IS NOT CONTROLLED WITH YOUR NAUSEA MEDICATION  *UNUSUAL SHORTNESS OF BREATH  *UNUSUAL BRUISING OR BLEEDING  TENDERNESS IN MOUTH AND THROAT WITH OR WITHOUT PRESENCE OF ULCERS  *URINARY PROBLEMS  *BOWEL PROBLEMS  UNUSUAL RASH Items with * indicate a potential emergency and should be followed up as soon as possible. If you have an emergency after office hours please contact your primary care physician or go to the nearest emergency department.  Please call the clinic during office hours if you have any questions or concerns.   You may also contact the Patient Navigator at [phone] should you have any questions or need assistance in obtaining follow up care.      Resources For Cancer Patients and their Caregivers ? American Cancer Society: Can assist with transportation, wigs, general needs, runs Look Good Feel Better.        [phone] ? Cancer Care: Provides financial assistance, online support groups, medication/co-pay assistance.  1-800-813-HOPE (905)332-3415) ? [place] Assists [place] cancer patients and their families through emotional , educational and financial support.   [phone] ? [place] DSS Where to apply for food stamps, Medicaid and utility assistance. [phone] ? RCATS: Transportation to medical appointments. [phone] ? Social Security Administration: May apply for disability if have a Stage IV cancer. [phone] ? [employer], Disability and Transit Services: Assists with nutrition, care and transit needs. [phone]

## 2016-07-22 NOTE — Progress Notes (Signed)
Tolerated tx w/o adverse reaction.  Alert, in no distress.  VSS.  Discharged ambulatory. 

## 2016-07-22 NOTE — Patient Instructions (Signed)
[place] at [facility] Discharge Instructions  RECOMMENDATIONS MADE BY THE CONSULTANT AND ANY TEST RESULTS WILL BE SENT TO YOUR REFERRING PHYSICIAN.  You were seen today by Dr. Whitney Muse. Rx for Levaquin 50mg  daily for 10 days, Prednisone 20mg  daily for 4 days then 10mg  daily for 4 days, and albuterol inhaler and aerochamber use twice a day. Return for follow up next cycle.  Thank you for choosing [place] at [facility] to provide your oncology and hematology care.  To afford each patient quality time with our provider, please arrive at least 15 minutes before your scheduled appointment time.    If you have a lab appointment with the [place] please come in thru the  Main Entrance and check in at the main information desk  You need to re-schedule your appointment should you arrive 10 or more minutes late.  We strive to give you quality time with our providers, and arriving late affects you and other patients whose appointments are after yours.  Also, if you no show three or more times for appointments you may be dismissed from the clinic at the providers discretion.     Again, thank you for choosing [facility].  Our hope is that these requests will decrease the amount of time that you wait before being seen by our physicians.       _____________________________________________________________  Should you have questions after your visit to [facility], please contact our office at [phone] between the hours of 8:30 a.m. and 4:30 p.m.  Voicemails left after 4:30 p.m. will not be returned until the following business day.  For prescription refill requests, have your pharmacy contact our office.       Resources For Cancer Patients and their Caregivers ? American Cancer Society: Can assist with transportation, wigs, general needs, runs Look Good Feel Better.        786-008-0954 ? Cancer Care: Provides  financial assistance, online support groups, medication/co-pay assistance.  1-800-813-HOPE 3317213630) ? [place] Assists [place] cancer patients and their families through emotional , educational and financial support.  4377841038 ? [place] DSS Where to apply for food stamps, Medicaid and utility assistance. 267-349-6431 ? [employer]: Transportation to medical appointments. [phone] [phone] ? Social Security Administration: May apply for disability if have a Stage IV cancer. 607-771-3613 754-278-2001 ? [employer], Disability and Transit Services: Assists with nutrition, care and transit needs. [place] Support Programs: @10RELATIVEDAYS @ > Cancer Support Group  2nd Tuesday of the month 1pm-2pm, Journey Room  > Creative Journey  3rd Tuesday of the month 1130am-1pm, Journey Room  > Look Good Feel Better  1st Wednesday of the month 10am-12 noon, Journey Room (Call [place] to register [phone])

## 2016-07-22 NOTE — Progress Notes (Signed)
[name]      HEMATOLOGY/ONCOLOGY PROGRESS NOTE  Date of Service: 03/16/2016  Patient Care Team: Zella Richer. Scotty Court, MD as PCP - General (Unknown Physician Specialty)  CHIEF COMPLAINTS:  Continued management of metastatic breast cancer    Metastatic breast cancer ([place])   06/07/2005 Initial Biopsy    Biopsy L axillary nodal mass, no breast primary identified      08/11/2005 -  Chemotherapy    AC x 4, with 20% dose reduction on last cycle      11/21/2005 - 12/30/2005 Radiation Therapy    XRT 5040 cGY to L breast and L axillae with Dr. Isidore Moos      11/21/2005 - 12/31/2010 Anti-estrogen oral therapy    Arimidex X 5 years      01/20/2011 PET scan    Widespread hypermetabolic osseous metastatic disease. Index lesions are given above. No evidence of hypermetabolic metastatic disease involving the neck, chest, abdomen or pelvis.       01/25/2011 -  Radiation Therapy    1200 cGy to the left rib and 2050 cGy in 20 fractions to the T5-T10 vertebral bodies with a boost of 675 cGy in 3 fractions           01/25/2011 Treatment Plan Change    Faslodex initiated approximately on this date then stopped. Stopped due to failure to respond. Faslodex given in a dose of 250 mg only 3 doses (July 17, July 31, August 14) and a single 500 mg dose March 24, 2011          04/25/2011 - 10/13/2011 Chemotherapy    Taxol chemotherapy given weekly 2 with one week off. Stopped due to progression          10/22/2011 - 05/11/2012 Anti-estrogen oral therapy    Aromasin and everolimus initiating but stopped due to progression          11/11/2011 Initial Biopsy    L3 vertebral body biopsy ER+PR+      11/11/2011 Procedure    Status post vertebral body augmentation for painful pathologic compression fractures at T11 and at L1 using the balloon kyphoplasty technique. Status post vertebral body augmentation for painful pathologic compression fracture at T8 using the vertebroplasty technique.      12/09/2012 - 06/10/2013 Anti-estrogen oral therapy    Fareston      04/22/2013 PET scan    Hypermetabolic left upper lobe nodule, possibly metastatic. The possibility of primary bronchogenic carcinoma is also considered. Residual hypermetabolic metastatic disease involving the liver and bones. Small left pleural effusion, stable or minimally increased from 03/07/2013.      06/11/2013 - 01/08/2014 Chemotherapy    Abraxane      01/31/2014 PET scan    Progressive metastatic lymphadenopathy within the right supraclavicular region and mediastinum.New hypermetabolic left adrenal metastasis. Mixed response of hypermetabolic liver metastases. Mild decrease in hypermetabolic activity associated with 1 cm left upper lobe pulmonary nodule. Diffuse bone metastases, with several in the thorax showing increased hypermetabolic activity consistent with mild progression of bone metastases.      02/01/2014 - 05/06/2015 Chemotherapy    XELODA 750 mg/m2 7 on/7 off       06/11/2014 PET scan    Partial metabolic response. Improving mediastinal lymphadenopathy, as above. Improving left adrenal metastasis. Prior hepatic metastasis is no longer evident.Improving multifocal osseous metastases, as above      05/04/2015 PET scan    Dominant finding is new widespread skeletal metastasis involvingthe axillary and appendicular skeleton. New lesions are not  discretely seen on the CT but recognized as new metabolic activity. New LEFT adrenal gland hypermetabolic metastasis. Foci of metabolic activity in the LEFT lung are felt to relate to atelectasis and/or infection. No clear evidence of pulmonary metastasis Moderate pleural LEFT pleural which could be sampled for cytology if clinically relevant.LEFT adrenal metastasis is actually an enlarged LEFT periaortic lymph node. This lymph node was enlarged and hypermetabolic to a greater degree FDG PET 01/31/2014 and subsequently improved and now has recurred on current exam  (05/04/2015) as a hypermetabolic enlarged LEFT periaortic lymph node.      05/04/2015 Progression    PET with progression of disease      05/06/2015 Treatment Plan Change    Megace      08/14/2015 PET scan    Suspect new hypermetabolic metastasis in posterior right hepatic lobe. Consider abdomen MRI without and with contrast for further evaluation. No significant change in probable malignant left pleural effusion and left lung atelectasis.No significant change in diffuse osseous metastatic disease. Stable hypermetabolic left abdominal paraaortic lymph node, consistent with metastatic disease      09/09/2015 - 03/07/2016 Chemotherapy    IBRANCE/FASLODEX . Ibrance at 100 mg secondary to grade 3 neutropenia      03/03/2016 PET scan    Interval overall progression of disease. Interval development of new and progression of pre-existing lesions in the liver, hypermetabolic on PET imaging, consistent with metastatic disease. Persistent hypermetabolic patchy opacity in the anterior left upper lobe with persistent chronic left pleural effusion. Diffuse bony metastases again noted with interval progression ofFDG uptake associated with the index bone lesions.      03/03/2016 Progression    PET demonstrates progression of disease.      03/11/2016 -  Chemotherapy    Halaven D1, 8 every 21 days and with Neupogen support.       06/27/2016 PET scan    1. Overall mild improvement of multifocal metastatic disease. 2. Improvement in metabolic nodularity at the LEFT lung base. 3. Improvement in hepatic metastasis. 4. Overall improvement skeletal metastasis. Multiple foci of intense hypermetabolic activity remain within the skeleton remain.        HISTORY OF PRESENTING ILLNESS:   Debbie Reyes is a wonderful 76 y.o. female who has been referred to [place] by Dr .[name], MD Dr Tressie Stalker for continued management of metastatic breast cancer. She presents today for ongoing care.   Debbie Reyes is  unaccompanied and presents in the treatment chair for cycle 7 of Eribulin. She has done quite well with therapy, few side effects.   She has been very tired this past week and has been coughing a lot. She feels mucus in her throat and after coughing a lot, it eventually comes up. She is unsure if she's ever had bronchitis before. She has been taking mucinex. She denies fever or chills, just cough with mucus production that has been lingering.   She has had no appetite because of this.  No headaches. No other major concerns today  MEDICAL HISTORY:  Past Medical History:  Diagnosis Date  . Bone metastases ([place]) 12/29/2015  . Cancer ([place])   . Low serum vitamin B12 03/18/2016  . Metastatic breast cancer ([place]) 12/29/2015  Depression   anxiety   SURGICAL HISTORY: L1 bone metastases biopsy  Axillary lymph node biopsy   SOCIAL HISTORY: Social History   Social History  . Marital status: Widowed    Spouse name: N/A  . Number of children: N/A  . Years  of education: N/A   Occupational History  . Not on file.   Social History Main Topics  . Smoking status: Never Smoker  . Smokeless tobacco: Never Used  . Alcohol use No  . Drug use: No  . Sexual activity: No   Other Topics Concern  . Not on file   Social History Narrative  . No narrative on file    FAMILY HISTORY: Patient denies family history of breast cancer, ovarian cancer or uterine cancer . Denies any family history of blood clotting or bleeding disorders   ALLERGIES:  has No Known Allergies.  MEDICATIONS:  Current Outpatient Prescriptions  Medication Sig Dispense Refill  . aspirin EC 81 MG tablet Take 81 mg by mouth daily.    [name] azithromycin (ZITHROMAX) 250 MG tablet Take as directed 6 each 0  . denosumab (XGEVA) 120 MG/1.7ML SOLN Inject 120 mg into the skin once. Pt took on 10/13/11    . EriBULin Mesylate (HALAVEN IV) Inject into the vein. Day 1 day 8, every 21 days    . HYDROcodone-acetaminophen (NORCO/VICODIN) 5-325  MG tablet Take 1 tablet by mouth every 6 (six) hours as needed. For pain, ok to fill on or after 06/17/2016 100 tablet 0  . lidocaine-prilocaine (EMLA) cream Apply topically as needed. 1 each 2  . LORazepam (ATIVAN) 1 MG tablet Take 1 mg by mouth at bedtime as needed. For sleep    . megestrol (MEGACE) 400 MG/10ML suspension Take 10 mLs (400 mg total) by mouth daily. 240 mL 1  . Melatonin 1 MG CAPS Take by mouth at bedtime as needed.    . ondansetron (ZOFRAN) 8 MG tablet Take 1 tablet (8 mg total) by mouth 2 (two) times daily as needed (Nausea or vomiting). (Patient not taking: Reported on 07/08/2016) 30 tablet 1  . PARoxetine (PAXIL) 20 MG tablet Take 1 tablet (20 mg total) by mouth daily. 30 tablet 2  . potassium chloride SA (K-DUR,KLOR-CON) 20 MEQ tablet Take 0.5 tablets (10 mEq total) by mouth 2 (two) times daily. 60 tablet 0  . prochlorperazine (COMPAZINE) 10 MG tablet Take 1 tablet (10 mg total) by mouth every 6 (six) hours as needed (Nausea or vomiting). (Patient not taking: Reported on 07/08/2016) 30 tablet 1  . propranolol (INDERAL) 10 MG tablet Take 1 tablet (10 mg total) by mouth 2 (two) times daily. 60 tablet 3   No current facility-administered medications for this visit.     REVIEW OF SYSTEMS:   Review of Systems  Constitutional: Positive for malaise/fatigue.       No appetite.   HENT: Negative.   Eyes: Negative.   Respiratory: Positive for cough and sputum production.   Cardiovascular: Negative.   Gastrointestinal: Negative.   Genitourinary: Negative.   Musculoskeletal: Negative.   Skin: Negative.   Neurological: Negative.   Endo/Heme/Allergies: Negative.   Psychiatric/Behavioral: Negative.   All other systems reviewed and are negative. 14 point review of systems was performed and is negative except as detailed under history of present illness and above   PHYSICAL EXAMINATION: ECOG PERFORMANCE STATUS: 1 - Symptomatic but completely ambulatory   Vitals with BMI  07/22/2016  Height   Weight 95 lbs  BMI   Systolic 270  Diastolic 69  Pulse 350  Respirations 18    Physical Exam  Constitutional: She is oriented to person, place, and time and well-developed, well-nourished, and in no distress.  Exam done from treatment chair. Wears glasses.   HENT:  Head: Normocephalic and atraumatic.  Eyes: EOM are normal. Pupils are equal, round, and reactive to light. No scleral icterus.  Neck: Normal range of motion. Neck supple.  Cardiovascular: Normal rate, regular rhythm and normal heart sounds.   Pulmonary/Chest: Effort normal. She has wheezes.  Abdominal: Soft. Bowel sounds are normal. She exhibits no distension and no mass. There is no tenderness. There is no rebound and no guarding.  Musculoskeletal: Normal range of motion.  Lymphadenopathy:    She has no cervical adenopathy.  Neurological: She is alert and oriented to person, place, and time. Gait normal.  Skin: Skin is warm and dry.  Psychiatric: Mood, memory, affect and judgment normal.  Nursing note and vitals reviewed.   LABORATORY DATA:  I have reviewed the data as listed  Results for Debbie, Reyes (MRN [id number]) as of 08/11/2016 10:37  Ref. Range 07/22/2016 13:08  Sodium Latest Ref Range: 135 - 145 mmol/L 133 (L)  Potassium Latest Ref Range: 3.5 - 5.1 mmol/L 4.0  Chloride Latest Ref Range: 101 - 111 mmol/L 99 (L)  CO2 Latest Ref Range: 22 - 32 mmol/L 27  Glucose Latest Ref Range: 65 - 99 mg/dL 102 (H)  BUN Latest Ref Range: 6 - 20 mg/dL 13  Creatinine Latest Ref Range: 0.44 - 1.00 mg/dL 0.86  Calcium Latest Ref Range: 8.9 - 10.3 mg/dL 8.9  Anion gap Latest Ref Range: 5 - 15  7  Alkaline Phosphatase Latest Ref Range: 38 - 126 U/L 51  Albumin Latest Ref Range: 3.5 - 5.0 g/dL 3.8  AST Latest Ref Range: 15 - 41 U/L 26  ALT Latest Ref Range: 14 - 54 U/L 12 (L)  Total Protein Latest Ref Range: 6.5 - 8.1 g/dL 6.8  Total Bilirubin Latest Ref Range: 0.3 - 1.2 mg/dL 0.4  EGFR (African  American) Latest Ref Range: >60 mL/min >60  EGFR (Non-African Amer.) Latest Ref Range: >60 mL/min >60  WBC Latest Ref Range: 4.0 - 10.5 K/uL 6.1  RBC Latest Ref Range: 3.87 - 5.11 MIL/uL 3.33 (L)  Hemoglobin Latest Ref Range: 12.0 - 15.0 g/dL 10.4 (L)  HCT Latest Ref Range: 36.0 - 46.0 % 31.7 (L)  MCV Latest Ref Range: 78.0 - 100.0 fL 95.2  MCH Latest Ref Range: 26.0 - 34.0 pg 31.2  MCHC Latest Ref Range: 30.0 - 36.0 g/dL 32.8  RDW Latest Ref Range: 11.5 - 15.5 % 17.6 (H)  Platelets Latest Ref Range: 150 - 400 K/uL 333  Neutrophils Latest Units: % 61  Lymphocytes Latest Units: % 19  Monocytes Relative Latest Units: % 19  Eosinophil Latest Units: % 0  Basophil Latest Units: % 1  NEUT# Latest Ref Range: 1.7 - 7.7 K/uL 3.8  Lymphocyte # Latest Ref Range: 0.7 - 4.0 K/uL 1.2  Monocyte # Latest Ref Range: 0.1 - 1.0 K/uL 1.2 (H)  Eosinophils Absolute Latest Ref Range: 0.0 - 0.7 K/uL 0.0  Basophils Absolute Latest Ref Range: 0.0 - 0.1 K/uL 0.0  CA 15-3 Latest Ref Range: 0.0 - 25.0 U/mL 196.6 (H)  CA 27.29 Latest Ref Range: 0.0 - 38.6 U/mL 319.8 (H)   Results for Debbie, Reyes (MRN [id number]) as of 08/11/2016 10:37  Ref. Range 04/01/2016 13:50 04/22/2016 13:46 05/20/2016 11:43 07/01/2016 11:44 07/22/2016 13:08  CA 15-3 Latest Ref Range: 0.0 - 25.0 U/mL 515.3 (H) 348.3 (H) 227.3 (H) 207.4 (H) 196.6 (H)   Results for Debbie, Reyes (MRN [id number]) as of 08/11/2016 10:37  Ref. Range 04/01/2016 13:50 04/22/2016 13:46 05/20/2016 11:43 07/01/2016 11:44 07/22/2016  13:08  CA 27.29 Latest Ref Range: 0.0 - 38.6 U/mL 655.0 (H) 507.7 (H) 323.1 (H) 313.1 (H) 319.8 (H)     RADIOGRAPHIC STUDIES: I have personally reviewed the radiological images as listed and agreed with the findings in the report. Study Result   CLINICAL DATA:  Subsequent treatment strategy for breast carcinoma with metastasis.  EXAM: NUCLEAR MEDICINE PET SKULL BASE TO THIGH  TECHNIQUE: 5.2 mCi F-18 FDG was injected intravenously.  Full-ring PET imaging was performed from the skull base to thigh after the radiotracer. CT data was obtained and used for attenuation correction and anatomic localization.  FASTING BLOOD GLUCOSE:  Value: 100 mg/dl  COMPARISON:  PET-CT 03/01/2016  FINDINGS: NECK  No hypermetabolic lymph nodes in the neck.  CHEST  Hypermetabolic nodularity inferior lingula and LEFT lower lobe is decreased. The nodular focus at the LEFT lung base is resolved. Metabolic activity in the inferior lingula has improved with SUV max 4.1 decrease of 6.3. There is persistent loculated effusion and bibasilar atelectasis. No new pulmonary nodules.  No hypermetabolic mediastinal lymph nodes.  ABDOMEN/PELVIS  The previous described liver lesions within the LEFT lateral hepatic lobe in the RIGHT hepatic lobe. Persistent lesion in subcapsular RIGHT hepatic lobe (segment 8) with SUV max equal 4.3 compared with SUV max equal 4.9. Subtle hypodensity on the CT portion exam measuring 10 mm on image 90, series 4. There is misregistration in this region secondary to patient breathing.  Adrenal glands normal. No hypermetabolic abdominopelvic lymph nodes. Uterus and ovaries are normal.  SKELETON  Overall improvement in skeletal metastasis. For example diffuse activity in the pelvis is decreased. For example activity in the posterior LEFT iliac bone with SUV max equals 6.3 decreased from 11.0. Metabolic lesion at L5 SUV max 10.6 is similar to 10.6. Decrease in metabolic activity through thoracic spine with SUV max equal 3 point 9 decreased from 9.6.  IMPRESSION: 1. Overall mild improvement of multifocal metastatic disease. 2. Improvement in metabolic nodularity at the LEFT lung base. 3. Improvement in hepatic metastasis. 4. Overall improvement skeletal metastasis. Multiple foci of intense hypermetabolic activity remain within the skeleton remain.   Electronically Signed   By: Suzy Bouchard M.D.   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 Tumor markers are falling, weight is stable to improved. Energy is baseline.  She is to continue on B12 and folate.   She continues to have persistent cough/wheezing. I have changed her antibiotic. I will order a CXR today. She will receive a breathing treatment today X 1.   I have given her a short course of prednisone and an albuterol inhaler. If symptoms do not improve over the next week I have asked her to call.  She will return on 07/26/16 for treatment.   Orders Placed This Encounter  Procedures  . DG Chest 2 View    JH/ESIGNED     CALL [phone] IN CANCER CLINIC WHEN SHE IS FINISHED AND THEY WILL COME BACK TO PICK HER UP    Standing Status:   Future    Number of Occurrences:   1    Standing Expiration Date:   07/22/2017    Order Specific Question:   Reason for Exam (SYMPTOM  OR DIAGNOSIS REQUIRED)    Answer:  stage IV breast cancer, severe cough X 2 weeks    Order Specific Question:   Preferred imaging location?    Answer:   [facility]  . CBC with Differential    Standing Status:   Standing    Number of Occurrences:   6    Standing Expiration Date:   07/22/2017  . Comprehensive metabolic panel    Standing Status:   Standing    Number of Occurrences:   6    Standing Expiration Date:   07/22/2017  . Cancer antigen 27.29    Standing Status:   Standing    Number of Occurrences:   6    Standing Expiration Date:   07/22/2017  . Cancer antigen 15-3    Standing Status:   Standing    Number of Occurrences:   6    Standing Expiration Date:   07/22/2017    All of the patients questions were answered to her apparent satisfaction. The patient knows to  call the clinic with any problems, questions or concerns.  This document serves as a record of services personally performed by Ancil Linsey, MD. It was created on her behalf by Martinique Casey, a trained medical scribe. The creation of this record is based on the scribe's personal observations and the provider's statements to them. This document has been checked and approved by the attending provider.  I have reviewed the above documentation for accuracy and completeness and I agree with the above.  Kelby Fam. Whitney Muse, MD

## 2016-07-23 LAB — CANCER ANTIGEN 15-3: CA 15-3: 196.6 U/mL — ABNORMAL HIGH (ref 0.0–25.0)

## 2016-07-23 LAB — CANCER ANTIGEN 27.29: CA 27.29: 319.8 U/mL — ABNORMAL HIGH (ref 0.0–38.6)

## 2016-07-25 ENCOUNTER — Encounter (HOSPITAL_COMMUNITY): Payer: Medicare Other | Attending: Hematology & Oncology

## 2016-07-25 VITALS — BP 144/81 | HR 112 | Temp 97.5°F | Resp 22

## 2016-07-25 DIAGNOSIS — D701 Agranulocytosis secondary to cancer chemotherapy: Secondary | ICD-10-CM

## 2016-07-25 DIAGNOSIS — C50919 Malignant neoplasm of unspecified site of unspecified female breast: Secondary | ICD-10-CM

## 2016-07-25 DIAGNOSIS — D702 Other drug-induced agranulocytosis: Secondary | ICD-10-CM

## 2016-07-25 MED ORDER — FILGRASTIM 300 MCG/0.5ML IJ SOSY
300.0000 ug | PREFILLED_SYRINGE | Freq: Once | INTRAMUSCULAR | Status: AC
  Administered 2016-07-25: 300 ug via SUBCUTANEOUS
  Filled 2016-07-25: qty 0.5

## 2016-07-25 NOTE — Patient Instructions (Signed)
[place] at [facility] Discharge Instructions  RECOMMENDATIONS MADE BY THE CONSULTANT AND ANY TEST RESULTS WILL BE SENT TO YOUR REFERRING PHYSICIAN.  You received neupogen today. Follow up as scheduled and call cancer center if any problems.  Thank you for choosing [place] at [facility] to provide your oncology and hematology care.  To afford each patient quality time with our provider, please arrive at least 15 minutes before your scheduled appointment time.    If you have a lab appointment with the [facility] please come in thru the  Main Entrance and check in at the main information desk  You need to re-schedule your appointment should you arrive 10 or more minutes late.  We strive to give you quality time with our providers, and arriving late affects you and other patients whose appointments are after yours.  Also, if you no show three or more times for appointments you may be dismissed from the clinic at the providers discretion.     Again, thank you for choosing [facility].  Our hope is that these requests will decrease the amount of time that you wait before being seen by our physicians.       _____________________________________________________________  Should you have questions after your visit to [facility], please contact our office at [phone] between the hours of 8:30 a.m. and 4:30 p.m.  Voicemails left after 4:30 p.m. will not be returned until the following business day.  For prescription refill requests, have your pharmacy contact our office.       Resources For Cancer Patients and their Caregivers ? American Cancer Society: Can assist with transportation, wigs, general needs, runs Look Good Feel Better.        (205)213-2279 ? Cancer Care: Provides financial assistance, online support groups, medication/co-pay assistance.  1-800-813-HOPE (508) 759-8104) ? [place] Assists [place] cancer patients and their families through emotional , educational and financial support.  813-483-4432 ? [place] DSS Where to apply for food stamps, Medicaid and utility assistance. (315)363-6962 ? RCATS: Transportation to medical appointments. 773-072-6279 ? Social Security Administration: May apply for disability if have a Stage IV cancer. (747)371-3546 (254)033-6670 ? [employer], Disability and Transit Services: Assists with nutrition, care and transit needs. [place] Support Programs: @10RELATIVEDAYS @ > Cancer Support Group  2nd Tuesday of the month 1pm-2pm, Journey Room  > Creative Journey  3rd Tuesday of the month 1130am-1pm, Journey Room  > Look Good Feel Better  1st Wednesday of the month 10am-12 noon, Journey Room (Call [place] to register [phone])

## 2016-07-25 NOTE — Progress Notes (Signed)
Debbie Reyes presents today for injection per MD orders. Neupogen  administered SQ in abdominal tissue. Administration without incident. Patient tolerated well. Patient left facility via wheelchair assisted to car by cancer center employee.

## 2016-07-26 ENCOUNTER — Encounter (HOSPITAL_COMMUNITY): Payer: Medicare Other | Attending: Hematology & Oncology

## 2016-07-26 VITALS — BP 91/50 | HR 117 | Temp 97.4°F | Resp 20

## 2016-07-26 DIAGNOSIS — C50919 Malignant neoplasm of unspecified site of unspecified female breast: Secondary | ICD-10-CM | POA: Diagnosis not present

## 2016-07-26 DIAGNOSIS — D701 Agranulocytosis secondary to cancer chemotherapy: Secondary | ICD-10-CM | POA: Diagnosis present

## 2016-07-26 DIAGNOSIS — D702 Other drug-induced agranulocytosis: Secondary | ICD-10-CM

## 2016-07-26 MED ORDER — FILGRASTIM 300 MCG/0.5ML IJ SOSY
300.0000 ug | PREFILLED_SYRINGE | Freq: Once | INTRAMUSCULAR | Status: AC
  Administered 2016-07-26: 300 ug via SUBCUTANEOUS
  Filled 2016-07-26: qty 0.5

## 2016-07-26 NOTE — Progress Notes (Signed)
Debbie Reyes presents today for injection per MD orders. Neupogen  administered SQ in Abdominal tissue. Administration without incident. Patient tolerated well. Patient left facility via wheelchair assisted by cancer center staff in stable condition.

## 2016-07-26 NOTE — Patient Instructions (Signed)
[place] at [facility] Discharge Instructions  RECOMMENDATIONS MADE BY THE CONSULTANT AND ANY TEST RESULTS WILL BE SENT TO YOUR REFERRING PHYSICIAN.  You had neupogen injection today. Keep follow up appointments as scheduled.  Thank you for choosing [place] at [facility] to provide your oncology and hematology care.  To afford each patient quality time with our provider, please arrive at least 15 minutes before your scheduled appointment time.    If you have a lab appointment with the [place] please come in thru the  Main Entrance and check in at the main information desk  You need to re-schedule your appointment should you arrive 10 or more minutes late.  We strive to give you quality time with our providers, and arriving late affects you and other patients whose appointments are after yours.  Also, if you no show three or more times for appointments you may be dismissed from the clinic at the providers discretion.     Again, thank you for choosing [facility].  Our hope is that these requests will decrease the amount of time that you wait before being seen by our physicians.       _____________________________________________________________  Should you have questions after your visit to [facility], please contact our office at [phone] between the hours of 8:30 a.m. and 4:30 p.m.  Voicemails left after 4:30 p.m. will not be returned until the following business day.  For prescription refill requests, have your pharmacy contact our office.       Resources For Cancer Patients and their Caregivers ? American Cancer Society: Can assist with transportation, wigs, general needs, runs Look Good Feel Better.        8068346541 ? Cancer Care: Provides financial assistance, online support groups, medication/co-pay assistance.  1-800-813-HOPE (803)887-8475) ? [facility] Assists  [place] cancer patients and their families through emotional , educational and financial support.  337-131-0177 ? [place] DSS Where to apply for food stamps, Medicaid and utility assistance. 714-501-5504 ? RCATS: Transportation to medical appointments. 651-020-4263 ? Social Security Administration: May apply for disability if have a Stage IV cancer. 504-104-2399 857-288-0930 ? [employer], Disability and Transit Services: Assists with nutrition, care and transit needs. [place] Support Programs: @10RELATIVEDAYS @ > Cancer Support Group  2nd Tuesday of the month 1pm-2pm, Journey Room  > Creative Journey  3rd Tuesday of the month 1130am-1pm, Journey Room  > Look Good Feel Better  1st Wednesday of the month 10am-12 noon, Journey Room (Call [place] to register [phone])

## 2016-08-03 ENCOUNTER — Other Ambulatory Visit (HOSPITAL_COMMUNITY): Payer: Self-pay

## 2016-08-03 ENCOUNTER — Telehealth (HOSPITAL_COMMUNITY): Payer: Self-pay | Admitting: *Deleted

## 2016-08-03 DIAGNOSIS — C50919 Malignant neoplasm of unspecified site of unspecified female breast: Secondary | ICD-10-CM

## 2016-08-03 MED ORDER — PAROXETINE HCL 20 MG PO TABS: 20.0000 mg | ORAL_TABLET | Freq: Every day | ORAL | 2 refills | Status: AC

## 2016-08-03 NOTE — Telephone Encounter (Signed)
Patient called for refill on paxil. Reviewed with PA who approved refill. Prescription sent to patients pharmacy. Patient notified and verbalized understanding.

## 2016-08-06 ENCOUNTER — Encounter (HOSPITAL_COMMUNITY): Payer: Self-pay | Admitting: Hematology & Oncology

## 2016-08-11 ENCOUNTER — Encounter (HOSPITAL_COMMUNITY): Payer: Self-pay | Admitting: Hematology & Oncology

## 2016-08-12 ENCOUNTER — Encounter (HOSPITAL_BASED_OUTPATIENT_CLINIC_OR_DEPARTMENT_OTHER): Payer: Medicare Other

## 2016-08-12 ENCOUNTER — Other Ambulatory Visit (HOSPITAL_COMMUNITY): Payer: Self-pay | Admitting: Oncology

## 2016-08-12 ENCOUNTER — Encounter (HOSPITAL_COMMUNITY): Payer: Self-pay | Admitting: Oncology

## 2016-08-12 ENCOUNTER — Encounter (HOSPITAL_COMMUNITY): Payer: Medicare Other | Attending: Hematology & Oncology | Admitting: Oncology

## 2016-08-12 VITALS — BP 128/72 | HR 101 | Temp 97.8°F | Resp 18 | Wt 93.2 lb

## 2016-08-12 DIAGNOSIS — C7951 Secondary malignant neoplasm of bone: Secondary | ICD-10-CM

## 2016-08-12 DIAGNOSIS — D702 Other drug-induced agranulocytosis: Secondary | ICD-10-CM

## 2016-08-12 DIAGNOSIS — E538 Deficiency of other specified B group vitamins: Secondary | ICD-10-CM

## 2016-08-12 DIAGNOSIS — C799 Secondary malignant neoplasm of unspecified site: Secondary | ICD-10-CM | POA: Diagnosis not present

## 2016-08-12 DIAGNOSIS — C50919 Malignant neoplasm of unspecified site of unspecified female breast: Secondary | ICD-10-CM | POA: Diagnosis not present

## 2016-08-12 DIAGNOSIS — R7989 Other specified abnormal findings of blood chemistry: Secondary | ICD-10-CM | POA: Diagnosis not present

## 2016-08-12 DIAGNOSIS — Z5111 Encounter for antineoplastic chemotherapy: Secondary | ICD-10-CM | POA: Diagnosis present

## 2016-08-12 DIAGNOSIS — C50912 Malignant neoplasm of unspecified site of left female breast: Secondary | ICD-10-CM

## 2016-08-12 LAB — CBC WITH DIFFERENTIAL/PLATELET
BASOS PCT: 0 %
Basophils Absolute: 0 10*3/uL (ref 0.0–0.1)
EOS PCT: 1 %
Eosinophils Absolute: 0 10*3/uL (ref 0.0–0.7)
HCT: 31.6 % — ABNORMAL LOW (ref 36.0–46.0)
Hemoglobin: 10.3 g/dL — ABNORMAL LOW (ref 12.0–15.0)
Lymphocytes Relative: 14 %
Lymphs Abs: 0.8 10*3/uL (ref 0.7–4.0)
MCH: 30.8 pg (ref 26.0–34.0)
MCHC: 32.6 g/dL (ref 30.0–36.0)
MCV: 94.6 fL (ref 78.0–100.0)
MONO ABS: 1.1 10*3/uL — AB (ref 0.1–1.0)
Monocytes Relative: 20 %
Neutro Abs: 3.7 10*3/uL (ref 1.7–7.7)
Neutrophils Relative %: 65 %
PLATELETS: 311 10*3/uL (ref 150–400)
RBC: 3.34 MIL/uL — ABNORMAL LOW (ref 3.87–5.11)
RDW: 17.6 % — AB (ref 11.5–15.5)
WBC: 5.7 10*3/uL (ref 4.0–10.5)

## 2016-08-12 LAB — COMPREHENSIVE METABOLIC PANEL
ALT: 9 U/L — ABNORMAL LOW (ref 14–54)
ANION GAP: 8 (ref 5–15)
AST: 24 U/L (ref 15–41)
Albumin: 3.5 g/dL (ref 3.5–5.0)
Alkaline Phosphatase: 56 U/L (ref 38–126)
BUN: 14 mg/dL (ref 6–20)
CHLORIDE: 100 mmol/L — AB (ref 101–111)
CO2: 25 mmol/L (ref 22–32)
Calcium: 9 mg/dL (ref 8.9–10.3)
Creatinine, Ser: 0.8 mg/dL (ref 0.44–1.00)
GFR calc Af Amer: >60 mL/min (ref >60)
Glucose, Bld: 101 mg/dL — ABNORMAL HIGH (ref 65–99)
POTASSIUM: 4.1 mmol/L (ref 3.5–5.1)
Sodium: 133 mmol/L — ABNORMAL LOW (ref 135–145)
Total Bilirubin: 0.5 mg/dL (ref 0.3–1.2)
Total Protein: 6.7 g/dL (ref 6.5–8.1)

## 2016-08-12 MED ORDER — ONDANSETRON HCL 4 MG PO TABS
ORAL_TABLET | ORAL | Status: AC
  Filled 2016-08-12: qty 1

## 2016-08-12 MED ORDER — PROCHLORPERAZINE MALEATE 10 MG PO TABS
10.0000 mg | ORAL_TABLET | Freq: Once | ORAL | Status: AC
  Administered 2016-08-12: 10 mg via ORAL
  Filled 2016-08-12: qty 1

## 2016-08-12 MED ORDER — ONDANSETRON HCL 4 MG PO TABS
8.0000 mg | ORAL_TABLET | Freq: Once | ORAL | Status: AC
  Administered 2016-08-12: 8 mg via ORAL
  Filled 2016-08-12: qty 2

## 2016-08-12 MED ORDER — SODIUM CHLORIDE 0.9 % IV SOLN
Freq: Once | INTRAVENOUS | Status: AC
  Administered 2016-08-12: 13:00:00 via INTRAVENOUS

## 2016-08-12 MED ORDER — SODIUM CHLORIDE 0.9 % IV SOLN
1.4000 mg/m2 | Freq: Once | INTRAVENOUS | Status: AC
  Administered 2016-08-12: 2 mg via INTRAVENOUS
  Filled 2016-08-12: qty 4

## 2016-08-12 MED ORDER — DENOSUMAB 120 MG/1.7ML ~~LOC~~ SOLN
120.0000 mg | Freq: Once | SUBCUTANEOUS | Status: AC
  Administered 2016-08-12: 120 mg via SUBCUTANEOUS
  Filled 2016-08-12: qty 1.7

## 2016-08-12 MED ORDER — HEPARIN SOD (PORK) LOCK FLUSH 100 UNIT/ML IV SOLN
500.0000 [IU] | Freq: Once | INTRAVENOUS | Status: AC | PRN
  Administered 2016-08-12: 500 [IU]
  Filled 2016-08-12: qty 5

## 2016-08-12 NOTE — Assessment & Plan Note (Addendum)
Low-normal B12 level with anemia and macrocytosis.  On oral B12 replacement therapy.   Lab in 3 weeks: B12

## 2016-08-12 NOTE — Progress Notes (Signed)
Tolerated tx w/o adverse reaction.  Alert, in no distress.  VSS.  Discharged via wheelchair.

## 2016-08-12 NOTE — Patient Instructions (Signed)
[facility] Discharge Instructions for Patients Receiving Chemotherapy   Beginning January 23rd 2017 lab work for the [facility] will be done in the  Main lab at [facility] on 1st floor. If you have a lab appointment with the [facility] please come in thru the  Main Entrance and check in at the main information desk   Today you received the following chemotherapy agents:  Halaven  If you develop nausea and vomiting, or diarrhea that is not controlled by your medication, call the clinic.  The clinic phone number is [phone]. Office hours are Monday-Friday 8:30am-5:00pm.  BELOW ARE SYMPTOMS THAT SHOULD BE REPORTED IMMEDIATELY:  *FEVER GREATER THAN 101.0 F  *CHILLS WITH OR WITHOUT FEVER  NAUSEA AND VOMITING THAT IS NOT CONTROLLED WITH YOUR NAUSEA MEDICATION  *UNUSUAL SHORTNESS OF BREATH  *UNUSUAL BRUISING OR BLEEDING  TENDERNESS IN MOUTH AND THROAT WITH OR WITHOUT PRESENCE OF ULCERS  *URINARY PROBLEMS  *BOWEL PROBLEMS  UNUSUAL RASH Items with * indicate a potential emergency and should be followed up as soon as possible. If you have an emergency after office hours please contact your primary care physician or go to the nearest emergency department.  Please call the clinic during office hours if you have any questions or concerns.   You may also contact the Patient Navigator at [phone] should you have any questions or need assistance in obtaining follow up care.      Resources For Cancer Patients and their Caregivers ? American Cancer Society: Can assist with transportation, wigs, general needs, runs Look Good Feel Better.        604-746-5818 ? Cancer Care: Provides financial assistance, online support groups, medication/co-pay assistance.  1-800-813-HOPE (210)389-8530) ? [place] Assists [place] cancer patients and their families through emotional , educational and financial support.   [phone] ? [place] DSS Where to apply for food stamps, Medicaid and utility assistance. 629-058-7444 ? RCATS: Transportation to medical appointments. 240-673-2455 ? Social Security Administration: May apply for disability if have a Stage IV cancer. 214-149-0616 4165336583 ? [employer], Disability and Transit Services: Assists with nutrition, care and transit needs. [phone]

## 2016-08-12 NOTE — Progress Notes (Signed)
Debbie Lair, MD [place] [address]  Metastatic breast cancer [facility]) - Plan: NM PET Image Restag (PS) Skull Base To Thigh, CBC with Differential, Comprehensive metabolic panel, Cancer antigen 27.29, Cancer antigen 15-3, CBC with Differential, Comprehensive metabolic panel  Bone metastases (HCC) - Plan: NM PET Image Restag (PS) Skull Base To Thigh, Cancer antigen 15-3  Low serum vitamin B12 - Plan: Vitamin B12  CURRENT THERAPY: Halaven days 1, 8 every 21 days (with Neupogen support) beginning on 03/11/2016 and Xgeva monthly.  B12 monthly.  INTERVAL HISTORY: Debbie Reyes 76 y.o. 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 Complicated by chemotherapy induced anemia and neutropenia. On Xgeva for bone metastases.    Metastatic breast cancer ([place])   06/07/2005 Initial Biopsy    Biopsy L axillary nodal mass, no breast primary identified      08/11/2005 -  Chemotherapy    AC x 4, with 20% dose reduction on last cycle      11/21/2005 - 12/30/2005 Radiation Therapy    XRT 5040 cGY to L breast and L axillae with Dr. Isidore Moos      11/21/2005 - 12/31/2010 Anti-estrogen oral therapy    Arimidex X 5 years      01/20/2011 PET scan    Widespread hypermetabolic osseous metastatic disease. Index lesions are given above. No evidence of hypermetabolic metastatic disease involving the neck, chest, abdomen or pelvis.       01/25/2011 -  Radiation Therapy    1200 cGy to the left rib and 2050 cGy in 20 fractions to the T5-T10 vertebral bodies with a boost of 675 cGy in 3 fractions           01/25/2011 Treatment Plan Change    Faslodex initiated approximately on this date then stopped. Stopped due to failure to respond. Faslodex given in a dose of 250 mg only 3 doses (July 17, July 31, August 14) and a single 500 mg dose March 24, 2011          04/25/2011 - 10/13/2011 Chemotherapy    Taxol chemotherapy given weekly 2 with one week off. Stopped due to progression          10/22/2011 - 05/11/2012 Anti-estrogen oral therapy    Aromasin and everolimus initiating but stopped due to progression          11/11/2011 Initial Biopsy    L3 vertebral body biopsy ER+PR+      11/11/2011 Procedure    Status post vertebral body augmentation for painful pathologic compression fractures at T11 and at L1 using the balloon kyphoplasty technique. Status post vertebral body augmentation for painful pathologic compression fracture at T8 using the vertebroplasty technique.      12/09/2012 - 06/10/2013 Anti-estrogen oral therapy    Fareston      04/22/2013 PET scan    Hypermetabolic left upper lobe nodule, possibly metastatic. The possibility of primary bronchogenic carcinoma is also considered. Residual hypermetabolic metastatic disease involving the liver and bones. Small left pleural effusion, stable or minimally increased from 03/07/2013.      06/11/2013 - 01/08/2014 Chemotherapy    Abraxane      01/31/2014 PET scan    Progressive metastatic lymphadenopathy within the right supraclavicular region and mediastinum.New hypermetabolic left adrenal metastasis. Mixed response of hypermetabolic liver metastases. Mild decrease in hypermetabolic activity associated with 1 cm  left upper lobe pulmonary nodule. Diffuse bone metastases, with several in the thorax showing increased hypermetabolic activity consistent with mild progression of bone metastases.      02/01/2014 - 05/06/2015 Chemotherapy    XELODA 750 mg/m2 7 on/7 off       06/11/2014 PET scan    Partial metabolic response. Improving mediastinal lymphadenopathy, as above. Improving left adrenal metastasis. Prior hepatic metastasis is no longer evident.Improving multifocal osseous metastases, as above      05/04/2015 PET scan    Dominant finding is new widespread skeletal  metastasis involvingthe axillary and appendicular skeleton. New lesions are not discretely seen on the CT but recognized as new metabolic activity. New LEFT adrenal gland hypermetabolic metastasis. Foci of metabolic activity in the LEFT lung are felt to relate to atelectasis and/or infection. No clear evidence of pulmonary metastasis Moderate pleural LEFT pleural which could be sampled for cytology if clinically relevant.LEFT adrenal metastasis is actually an enlarged LEFT periaortic lymph node. This lymph node was enlarged and hypermetabolic to a greater degree FDG PET 01/31/2014 and subsequently improved and now has recurred on current exam (05/04/2015) as a hypermetabolic enlarged LEFT periaortic lymph node.      05/04/2015 Progression    PET with progression of disease      05/06/2015 Treatment Plan Change    Megace      08/14/2015 PET scan    Suspect new hypermetabolic metastasis in posterior right hepatic lobe. Consider abdomen MRI without and with contrast for further evaluation. No significant change in probable malignant left pleural effusion and left lung atelectasis.No significant change in diffuse osseous metastatic disease. Stable hypermetabolic left abdominal paraaortic lymph node, consistent with metastatic disease      09/09/2015 - 03/07/2016 Chemotherapy    IBRANCE/FASLODEX . Ibrance at 100 mg secondary to grade 3 neutropenia      03/03/2016 PET scan    Interval overall progression of disease. Interval development of new and progression of pre-existing lesions in the liver, hypermetabolic on PET imaging, consistent with metastatic disease. Persistent hypermetabolic patchy opacity in the anterior left upper lobe with persistent chronic left pleural effusion. Diffuse bony metastases again noted with interval progression ofFDG uptake associated with the index bone lesions.      03/03/2016 Progression    PET demonstrates progression of disease.      03/11/2016 -  Chemotherapy     Halaven D1, 8 every 21 days and with Neupogen support.       06/27/2016 PET scan    1. Overall mild improvement of multifocal metastatic disease. 2. Improvement in metabolic nodularity at the LEFT lung base. 3. Improvement in hepatic metastasis. 4. Overall improvement skeletal metastasis. Multiple foci of intense hypermetabolic activity remain within the skeleton remain.       She is tolerating therapy well.  She denies any nausea or vomiting.  She denies any bowel or urinary complaints.  She reports that her cough is improved, yet persistent without any production other that clear sputum.  She notes that her breathing is at baseline.  She denies any fever or chills.  She has completed 2 courses of antibiotics.    She is upset about the departure of Dr. Whitney Muse.  She has been in touch with Dr. Sonny Dandy.    She wishes to continue to pursue treatment as planned.  She is taking PO B12 tablets.  Review of Systems  Constitutional: Negative.  Negative for chills, fever and weight loss.  HENT: Negative.   Eyes: Negative.  Respiratory: Negative.  Negative for cough.   Cardiovascular: Negative.  Negative for chest pain.  Gastrointestinal: Negative.  Negative for constipation, diarrhea, nausea and vomiting.  Genitourinary: Negative.   Musculoskeletal: Negative.   Skin: Negative.   Neurological: Negative.  Negative for weakness.  Endo/Heme/Allergies: Negative.   Psychiatric/Behavioral: Negative.     Past Medical History:  Diagnosis Date  . Bone metastases ([place]) 12/29/2015  . Cancer ([place])   . Low serum vitamin B12 03/18/2016  . Metastatic breast cancer ([place]) 12/29/2015    History reviewed. No pertinent surgical history.  History reviewed. No pertinent family history.  Social History   Social History  . Marital status: Widowed    Spouse name: N/A  . Number of children: N/A  . Years of education: N/A   Social History Main Topics  . Smoking status: Never Smoker  . Smokeless  tobacco: Never Used  . Alcohol use No  . Drug use: No  . Sexual activity: No   Other Topics Concern  . None   Social History Narrative  . None     PHYSICAL EXAMINATION  ECOG PERFORMANCE STATUS: 1 - Symptomatic but completely ambulatory  There were no vitals filed for this visit.  Vitals - 1 value per visit [id number]  SYSTOLIC 0000000  DIASTOLIC 72  Pulse [id number]  Temperature 97.8  Respirations 18  Weight (lb) 93.2    GENERAL:alert, no distress, well nourished, well developed, comfortable, cooperative, smiling and unaccompanied, well dressed. SKIN: skin color, texture, turgor are normal, no rashes or significant lesions HEAD: Normocephalic, No masses, lesions, tenderness or abnormalities EYES: normal, EOMI, Conjunctiva are pink and non-injected EARS: External ears normal OROPHARYNX:lips, buccal mucosa, and tongue normal and mucous membranes are moist  NECK: supple, trachea midline LYMPH:  no palpable lymphadenopathy BREAST:not examined LUNGS: clear to auscultation , decreased breath sounds bilaterally with no breath sounds in bases bilaterally. HEART: regular rate & rhythm, no murmurs and no gallops ABDOMEN:abdomen soft and normal bowel sounds BACK: Back symmetric, no curvature. EXTREMITIES:less then 2 second capillary refill, no joint deformities, effusion, or inflammation, no skin discoloration, no cyanosis  NEURO: alert & oriented x 3 with fluent speech, no focal motor/sensory deficits, gait normal   LABORATORY DATA: CBC    Component Value Date/Time   WBC 5.7 08/12/2016 1141   RBC 3.34 (L) 08/12/2016 1141   HGB 10.3 (L) 08/12/2016 1141   HCT 31.6 (L) 08/12/2016 1141   PLT 311 08/12/2016 1141   MCV 94.6 08/12/2016 1141   MCH 30.8 08/12/2016 1141   MCHC 32.6 08/12/2016 1141   RDW 17.6 (H) 08/12/2016 1141   LYMPHSABS 0.8 08/12/2016 1141   MONOABS 1.1 (H) 08/12/2016 1141   EOSABS 0.0 08/12/2016 1141   BASOSABS 0.0 08/12/2016 1141      Chemistry      Component  Value Date/Time   NA 133 (L) 08/12/2016 1141   K 4.1 08/12/2016 1141   CL 100 (L) 08/12/2016 1141   CO2 25 08/12/2016 1141   BUN 14 08/12/2016 1141   CREATININE 0.80 08/12/2016 1141      Component Value Date/Time   CALCIUM 9.0 08/12/2016 1141   ALKPHOS 56 08/12/2016 1141   AST 24 08/12/2016 1141   ALT 9 (L) 08/12/2016 1141   BILITOT 0.5 08/12/2016 1141     Lab Results  Component Value Date   LABCA2 319.8 (H) 07/22/2016     PENDING LABS:   RADIOGRAPHIC STUDIES:  Dg Chest 2 View  Result Date: 07/22/2016 CLINICAL DATA:  76 year old female with cough and congestion for 2 weeks. Metastatic breast cancer. Subsequent encounter. EXAM: CHEST  2 VIEW COMPARISON:  PET-CT 06/27/16 and earlier FINDINGS: Stable right chest porta cath, currently accessed. Osteopenia with widespread spinal compression fractures, most previously augmented. Partially calcified right breast implant. Stable left axillary surgical clips. Chronic posterior right rib fractures. Expansile metastasis of the left seventh rib re- demonstrated. Stable visualized osseous structures. Stable and negative visible bowel gas pattern. Stable right chest porta cath, currently accessed. Continued hypo ventilation at the left lung base where loculated effusion and airspace disease were demonstrated on the recent PET-CT. No superimposed pneumothorax, pulmonary edema or new pulmonary opacity. IMPRESSION: 1.  No new cardiopulmonary abnormality. 2. Loculated left pleural effusion and left lung base airspace disease appears stable from the recent PET-CT. 3. Stable metastatic and chronic osseous findings. Electronically Signed   By: Genevie Ann M.D.   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  Complicated by chemotherapy induced anemia and neutropenia.  On  Xgeva for bone metastases.  Now on Halaven days 1, 8 every 21 days (with Neupogen support) beginning on 03/11/2016.  Oncology history is updated.  Pre-treatment labs as ordered day 1 of treatment: CBC diff, CMET, CA 27.29, and CA 15-3.  I personally reviewed and went over laboratory results with the patient.  The results are noted within this dictation.  Delton See is due today if corrected calcium is WNL.  Calcium is WNL.  Therefore, Delton See will be given today.  She will be due for restaging PET in March 2018.  Order is placed.  Return in 3 weeks for follow-up with ongoing palliative treatment.  Low serum vitamin B12 Low-normal B12 level with anemia and macrocytosis.  On oral B12 replacement therapy.   Lab in 3 weeks: B12   ORDERS PLACED FOR THIS ENCOUNTER: Orders Placed This Encounter  Procedures  . NM PET Image Restag (PS) Skull Base To Thigh  . CBC with Differential  . Comprehensive metabolic panel  . Cancer antigen 27.29  . Cancer antigen 15-3  . CBC with Differential  . Comprehensive metabolic panel  . Vitamin B12    MEDICATIONS PRESCRIBED THIS ENCOUNTER: No orders of the defined types were placed in this encounter.   THERAPY PLAN:  Continue with Halaven treatment with palliative intent as planned.  She will continue to need G-CSF support with treatment.  Xgeva monthly for osseous metastatic disease.  All questions were answered. The patient knows to call the clinic with any problems, questions or concerns. We can certainly see the patient much sooner if necessary.  Patient and plan discussed with Dr. Twana First and she is in agreement with the aforementioned.   This note is electronically signed by: Doy Mince 08/12/2016 3:35 PM

## 2016-08-12 NOTE — Patient Instructions (Signed)
[place] at [facility] Discharge Instructions  RECOMMENDATIONS MADE BY THE CONSULTANT AND ANY TEST RESULTS WILL BE SENT TO YOUR REFERRING PHYSICIAN.  You were seen today by Kirby Crigler, PA-C Continue taking vitamin B-12 (cyanobalamine )at home Continue getting Neupogen as scheduled Follow up in 3 weeks  Schedule PET scan in March  Thank you for choosing [place] at [facility] to provide your oncology and hematology care.  To afford each patient quality time with our provider, please arrive at least 15 minutes before your scheduled appointment time.    If you have a lab appointment with the [facility] please come in thru the  Main Entrance and check in at the main information desk  You need to re-schedule your appointment should you arrive 10 or more minutes late.  We strive to give you quality time with our providers, and arriving late affects you and other patients whose appointments are after yours.  Also, if you no show three or more times for appointments you may be dismissed from the clinic at the providers discretion.     Again, thank you for choosing [facility]  Our hope is that these requests will decrease the amount of time that you wait before being seen by our physicians.       _____________________________________________________________  Should you have questions after your visit to [facility], please contact our office at [phone] between the hours of 8:30 a.m. and 4:30 p.m.  Voicemails left after 4:30 p.m. will not be returned until the following business day.  For prescription refill requests, have your pharmacy contact our office.       Resources For Cancer Patients and their Caregivers ? American Cancer Society: Can assist with transportation, wigs, general needs, runs Look Good Feel Better.        984-664-0104 ? Cancer Care: Provides financial assistance, online support  groups, medication/co-pay assistance.  1-800-813-HOPE (937)235-0484) ? [place] Assists [place] cancer patients and their families through emotional , educational and financial support.  334-160-9932 ? [place] DSS Where to apply for food stamps, Medicaid and utility assistance. 571-014-1902 ? RC[employer]: Transportation to medical appointments. ([phone] ? Social Security Administration: May apply for disability if have a Stage IV cancer. 973-249-8123 581-027-7107 ? [employer], Disability and Transit Services: Assists with nutrition, care and transit needs. [place] Support Programs: @10RELATIVEDAYS @ > Cancer Support Group  2nd Tuesday of the month 1pm-2pm, Journey Room  > Creative Journey  3rd Tuesday of the month 1130am-1pm, Journey Room  > Look Good Feel Better  1st Wednesday of the month 10am-12 noon, Journey Room (Call [place] to register [phone])

## 2016-08-12 NOTE — Assessment & Plan Note (Addendum)
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  Complicated by chemotherapy induced anemia and neutropenia.  On Xgeva for bone metastases.  Now on Halaven days 1, 8 every 21 days (with Neupogen support) beginning on 03/11/2016.  Oncology history is updated.  Pre-treatment labs as ordered day 1 of treatment: CBC diff, CMET, CA 27.29, and CA 15-3.  I personally reviewed and went over laboratory results with the patient.  The results are noted within this dictation.  Delton See is due today if corrected calcium is WNL.  Calcium is WNL.  Therefore, Delton See will be given today.  She will be due for restaging PET in March 2018.  Order is placed.  Return in 3 weeks for follow-up with ongoing palliative treatment.

## 2016-08-14 LAB — CANCER ANTIGEN 15-3: CA 15-3: 183.1 U/mL — ABNORMAL HIGH (ref 0.0–25.0)

## 2016-08-15 ENCOUNTER — Encounter (HOSPITAL_BASED_OUTPATIENT_CLINIC_OR_DEPARTMENT_OTHER): Payer: Medicare Other

## 2016-08-15 ENCOUNTER — Encounter (HOSPITAL_COMMUNITY): Payer: Self-pay

## 2016-08-15 VITALS — BP 111/56 | HR 119 | Temp 97.4°F | Resp 18

## 2016-08-15 DIAGNOSIS — D701 Agranulocytosis secondary to cancer chemotherapy: Secondary | ICD-10-CM | POA: Diagnosis present

## 2016-08-15 DIAGNOSIS — D702 Other drug-induced agranulocytosis: Secondary | ICD-10-CM

## 2016-08-15 DIAGNOSIS — C50919 Malignant neoplasm of unspecified site of unspecified female breast: Secondary | ICD-10-CM | POA: Diagnosis not present

## 2016-08-15 LAB — CANCER ANTIGEN 27.29: CA 27.29: 295.3 U/mL — ABNORMAL HIGH (ref 0.0–38.6)

## 2016-08-15 MED ORDER — FILGRASTIM 300 MCG/0.5ML IJ SOSY
300.0000 ug | PREFILLED_SYRINGE | Freq: Once | INTRAMUSCULAR | Status: AC
  Administered 2016-08-15: 300 ug via SUBCUTANEOUS
  Filled 2016-08-15: qty 0.5

## 2016-08-15 NOTE — Patient Instructions (Signed)
Debbie Reyes at [facility] Discharge Instructions  RECOMMENDATIONS MADE BY THE CONSULTANT AND ANY TEST RESULTS WILL BE SENT TO YOUR REFERRING PHYSICIAN.  Neupogen today.    Thank you for choosing [place] at [facility] to provide your oncology and hematology care.  To afford each patient quality time with our provider, please arrive at least 15 minutes before your scheduled appointment time.    If you have a lab appointment with the [facility] please come in thru the  Main Entrance and check in at the main information desk  You need to re-schedule your appointment should you arrive 10 or more minutes late.  We strive to give you quality time with our providers, and arriving late affects you and other patients whose appointments are after yours.  Also, if you no show three or more times for appointments you may be dismissed from the clinic at the providers discretion.     Again, thank you for choosing [facility].  Our hope is that these requests will decrease the amount of time that you wait before being seen by our physicians.       _____________________________________________________________  Should you have questions after your visit to [facility], please contact our office at [phone] between the hours of 8:30 a.m. and 4:30 p.m.  Voicemails left after 4:30 p.m. will not be returned until the following business day.  For prescription refill requests, have your pharmacy contact our office.       Resources For Cancer Patients and their Caregivers ? American Cancer Society: Can assist with transportation, wigs, general needs, runs Look Good Feel Better.        ([phone] 180-7489 ? Cancer Care: Provides financial assistance, online support groups, medication/co-pay assistance.  1-800-813-HOPE 712-386-1628) ? [place] Assists [place] cancer patients and their families through  emotional , educational and financial support.  (438) 139-2910 ? [place] DSS Where to apply for food stamps, Medicaid and utility assistance. 938-225-0765 ? [employer]: Transportation to medical appointments. 9287514429 ? Social Security Administration: May apply for disability if have a Stage IV cancer. ([phone] 267-098-3073 ? [employer], Disability and Transit Services: Assists with nutrition, care and transit needs. [place] Support Programs: @10RELATIVEDAYS @ > Cancer Support Group  2nd Tuesday of the month 1pm-2pm, Journey Room  > Creative Journey  3rd Tuesday of the month 1130am-1pm, Journey Room  > Look Good Feel Better  1st Wednesday of the month 10am-12 noon, Journey Room (Call [place] to register [phone])

## 2016-08-15 NOTE — Progress Notes (Signed)
Debbie Reyes presents today for injection per MD orders. Neupogen 323mcg administered SQ in left Abdomen. Administration without incident. Patient tolerated well.

## 2016-08-16 ENCOUNTER — Encounter (HOSPITAL_BASED_OUTPATIENT_CLINIC_OR_DEPARTMENT_OTHER): Payer: Medicare Other

## 2016-08-16 ENCOUNTER — Encounter (HOSPITAL_COMMUNITY): Payer: Self-pay | Admitting: Adult Health

## 2016-08-16 ENCOUNTER — Telehealth (HOSPITAL_COMMUNITY): Payer: Self-pay | Admitting: *Deleted

## 2016-08-16 ENCOUNTER — Other Ambulatory Visit (HOSPITAL_COMMUNITY): Payer: Self-pay | Admitting: Adult Health

## 2016-08-16 VITALS — BP 99/56 | HR 125 | Temp 98.5°F | Resp 18

## 2016-08-16 DIAGNOSIS — D702 Other drug-induced agranulocytosis: Secondary | ICD-10-CM

## 2016-08-16 DIAGNOSIS — C50919 Malignant neoplasm of unspecified site of unspecified female breast: Secondary | ICD-10-CM

## 2016-08-16 DIAGNOSIS — C7951 Secondary malignant neoplasm of bone: Secondary | ICD-10-CM

## 2016-08-16 DIAGNOSIS — D701 Agranulocytosis secondary to cancer chemotherapy: Secondary | ICD-10-CM | POA: Diagnosis present

## 2016-08-16 MED ORDER — HYDROCODONE-ACETAMINOPHEN 5-325 MG PO TABS: 1.0000 | ORAL_TABLET | Freq: Four times a day (QID) | ORAL | 0 refills | Status: DC | PRN

## 2016-08-16 MED ORDER — FILGRASTIM 300 MCG/0.5ML IJ SOSY
300.0000 ug | PREFILLED_SYRINGE | Freq: Once | INTRAMUSCULAR | Status: AC
  Administered 2016-08-16: 300 ug via SUBCUTANEOUS
  Filled 2016-08-16: qty 0.5

## 2016-08-16 MED ORDER — OCTREOTIDE ACETATE 30 MG IM KIT
PACK | INTRAMUSCULAR | Status: AC
  Filled 2016-08-16: qty 1

## 2016-08-16 NOTE — Patient Instructions (Signed)
[place] at [facility] Discharge Instructions  RECOMMENDATIONS MADE BY THE CONSULTANT AND ANY TEST RESULTS WILL BE SENT TO YOUR REFERRING PHYSICIAN.  Neupogen injection today. Return as scheduled.  Thank you for choosing [place] at [facility] to provide your oncology and hematology care.  To afford each patient quality time with our provider, please arrive at least 15 minutes before your scheduled appointment time.    If you have a lab appointment with the [facility] please come in thru the  Main Entrance and check in at the main information desk  You need to re-schedule your appointment should you arrive 10 or more minutes late.  We strive to give you quality time with our providers, and arriving late affects you and other patients whose appointments are after yours.  Also, if you no show three or more times for appointments you may be dismissed from the clinic at the providers discretion.     Again, thank you for choosing [facility].  Our hope is that these requests will decrease the amount of time that you wait before being seen by our physicians.       _____________________________________________________________  Should you have questions after your visit to [facility], please contact our office at [phone] between the hours of 8:30 a.m. and 4:30 p.m.  Voicemails left after 4:30 p.m. will not be returned until the following business day.  For prescription refill requests, have your pharmacy contact our office.       Resources For Cancer Patients and their Caregivers ? American Cancer Society: Can assist with transportation, wigs, general needs, runs Look Good Feel Better.        847-604-1212 ? Cancer Care: Provides financial assistance, online support groups, medication/co-pay assistance.  1-800-813-HOPE (279) 790-9111) ? [place] Assists [place] cancer patients and  their families through emotional , educational and financial support.  587-695-4266 ? [place] DSS Where to apply for food stamps, Medicaid and utility assistance. ([phone] 818-6476 ? [employer]: Transportation to medical appointments. 2175740644 ? Social Security Administration: May apply for disability if have a Stage IV cancer. 332-681-3511 [phone])661-8311 ? [employer], Disability and Transit Services: Assists with nutrition, care and transit needs. [place] Support Programs: @10RELATIVEDAYS @ > Cancer Support Group  2nd Tuesday of the month 1pm-2pm, Journey Room  > Creative Journey  3rd Tuesday of the month 1130am-1pm, Journey Room  > Look Good Feel Better  1st Wednesday of the month 10am-12 noon, Journey Room (Call [place] to register [phone])

## 2016-08-16 NOTE — Progress Notes (Signed)
Debbie Reyes presents today for injection per the provider's orders.  Neupogen administration without incident; see MAR for injection details.  Patient tolerated procedure well and without incident.  No questions or complaints noted at this time.

## 2016-08-16 NOTE — Progress Notes (Signed)
Patient presents for injection today; requesting refill of Hydrocodone/APAP 5/325.  [place] Controlled Substance Registry reviewed and paper prescription provided to patient today for Norco 5/325, 1 tab po Q4Hprn, #100, no refills.   [place] Controlled Substance Registry data:    Mike Craze, NP [place] [phone]

## 2016-08-17 MED ORDER — HEPARIN SOD (PORK) LOCK FLUSH 100 UNIT/ML IV SOLN
INTRAVENOUS | Status: AC
  Filled 2016-08-17: qty 5

## 2016-08-19 ENCOUNTER — Other Ambulatory Visit (HOSPITAL_COMMUNITY): Payer: Self-pay | Admitting: Oncology

## 2016-08-19 ENCOUNTER — Encounter (HOSPITAL_COMMUNITY): Payer: Self-pay

## 2016-08-19 ENCOUNTER — Encounter (HOSPITAL_BASED_OUTPATIENT_CLINIC_OR_DEPARTMENT_OTHER): Payer: Medicare Other

## 2016-08-19 VITALS — BP 114/54 | HR 86 | Temp 97.9°F | Resp 18

## 2016-08-19 DIAGNOSIS — C799 Secondary malignant neoplasm of unspecified site: Secondary | ICD-10-CM | POA: Diagnosis not present

## 2016-08-19 DIAGNOSIS — D6481 Anemia due to antineoplastic chemotherapy: Secondary | ICD-10-CM

## 2016-08-19 DIAGNOSIS — C50919 Malignant neoplasm of unspecified site of unspecified female breast: Secondary | ICD-10-CM

## 2016-08-19 DIAGNOSIS — C7951 Secondary malignant neoplasm of bone: Secondary | ICD-10-CM

## 2016-08-19 DIAGNOSIS — C50912 Malignant neoplasm of unspecified site of left female breast: Secondary | ICD-10-CM

## 2016-08-19 DIAGNOSIS — E538 Deficiency of other specified B group vitamins: Secondary | ICD-10-CM

## 2016-08-19 DIAGNOSIS — Z5111 Encounter for antineoplastic chemotherapy: Secondary | ICD-10-CM | POA: Diagnosis not present

## 2016-08-19 DIAGNOSIS — D702 Other drug-induced agranulocytosis: Secondary | ICD-10-CM

## 2016-08-19 LAB — CBC WITH DIFFERENTIAL/PLATELET
BASOS PCT: 0 %
Band Neutrophils: 9 %
Basophils Absolute: 0 10*3/uL (ref 0.0–0.1)
EOS ABS: 0.3 10*3/uL (ref 0.0–0.7)
EOS PCT: 2 %
HEMATOCRIT: 29.6 % — AB (ref 36.0–46.0)
Hemoglobin: 9.6 g/dL — ABNORMAL LOW (ref 12.0–15.0)
Lymphocytes Relative: 22 %
Lymphs Abs: 3.2 10*3/uL (ref 0.7–4.0)
MCH: 31 pg (ref 26.0–34.0)
MCHC: 32.4 g/dL (ref 30.0–36.0)
MCV: 95.5 fL (ref 78.0–100.0)
MONOS PCT: 15 %
Monocytes Absolute: 2.2 10*3/uL — ABNORMAL HIGH (ref 0.1–1.0)
Myelocytes: 2 %
Neutro Abs: 8.7 10*3/uL — ABNORMAL HIGH (ref 1.7–7.7)
Neutrophils Relative %: 50 %
PLATELETS: 343 10*3/uL (ref 150–400)
RBC: 3.1 MIL/uL — AB (ref 3.87–5.11)
RDW: 18.4 % — AB (ref 11.5–15.5)
WBC: 14.4 10*3/uL — ABNORMAL HIGH (ref 4.0–10.5)

## 2016-08-19 LAB — COMPREHENSIVE METABOLIC PANEL
ALBUMIN: 3.4 g/dL — AB (ref 3.5–5.0)
ALT: 13 U/L — AB (ref 14–54)
AST: 27 U/L (ref 15–41)
Alkaline Phosphatase: 64 U/L (ref 38–126)
Anion gap: 8 (ref 5–15)
BUN: 21 mg/dL — ABNORMAL HIGH (ref 6–20)
CHLORIDE: 102 mmol/L (ref 101–111)
CO2: 25 mmol/L (ref 22–32)
Calcium: 9.3 mg/dL (ref 8.9–10.3)
Creatinine, Ser: 0.91 mg/dL (ref 0.44–1.00)
GFR calc Af Amer: >60 mL/min (ref >60)
GFR calc non Af Amer: >60 mL/min (ref >60)
GLUCOSE: 94 mg/dL (ref 65–99)
POTASSIUM: 3.7 mmol/L (ref 3.5–5.1)
Sodium: 135 mmol/L (ref 135–145)
Total Bilirubin: 0.3 mg/dL (ref 0.3–1.2)
Total Protein: 6.3 g/dL — ABNORMAL LOW (ref 6.5–8.1)

## 2016-08-19 MED ORDER — DARBEPOETIN ALFA 500 MCG/ML IJ SOSY
500.0000 ug | PREFILLED_SYRINGE | INTRAMUSCULAR | Status: DC
  Administered 2016-08-19: 500 ug via SUBCUTANEOUS
  Filled 2016-08-19: qty 1

## 2016-08-19 MED ORDER — SODIUM CHLORIDE 0.9% FLUSH
10.0000 mL | INTRAVENOUS | Status: DC | PRN
  Administered 2016-08-19: 10 mL
  Filled 2016-08-19: qty 10

## 2016-08-19 MED ORDER — HEPARIN SOD (PORK) LOCK FLUSH 100 UNIT/ML IV SOLN
500.0000 [IU] | Freq: Once | INTRAVENOUS | Status: AC | PRN
  Administered 2016-08-19: 500 [IU]
  Filled 2016-08-19 (×2): qty 5

## 2016-08-19 MED ORDER — SODIUM CHLORIDE 0.9 % IV SOLN
Freq: Once | INTRAVENOUS | Status: AC
  Administered 2016-08-19: 12:00:00 via INTRAVENOUS

## 2016-08-19 MED ORDER — SODIUM CHLORIDE 0.9 % IV SOLN
1.4000 mg/m2 | Freq: Once | INTRAVENOUS | Status: AC
  Administered 2016-08-19: 2 mg via INTRAVENOUS
  Filled 2016-08-19: qty 4

## 2016-08-19 MED ORDER — PROCHLORPERAZINE MALEATE 10 MG PO TABS
10.0000 mg | ORAL_TABLET | Freq: Once | ORAL | Status: AC
  Administered 2016-08-19: 10 mg via ORAL
  Filled 2016-08-19: qty 1

## 2016-08-19 MED ORDER — ONDANSETRON HCL 4 MG PO TABS
8.0000 mg | ORAL_TABLET | Freq: Once | ORAL | Status: AC
  Administered 2016-08-19: 8 mg via ORAL
  Filled 2016-08-19: qty 2

## 2016-08-19 MED ORDER — CYANOCOBALAMIN 1000 MCG/ML IJ SOLN: 1000.0000 ug | Freq: Once | INTRAMUSCULAR | Status: DC

## 2016-08-19 NOTE — Patient Instructions (Signed)
[facility] Discharge Instructions for Patients Receiving Chemotherapy   Beginning January 23rd 2017 lab work for the Cancer Center will be done in the  Main lab at [place] [facility] on 1st floor. If you have a lab appointment with the Cancer Center please come in thru the  Main Entrance and check in at the main information desk   Today you received the following chemotherapy agents   To help prevent nausea and vomiting after your treatment, we encourage you to take your nausea medication     If you develop nausea and vomiting, or diarrhea that is not controlled by your medication, call the clinic.  The clinic phone number is [phone]. Office hours are Monday-Friday 8:30am-5:00pm.  BELOW ARE SYMPTOMS THAT SHOULD BE REPORTED IMMEDIATELY:  *FEVER GREATER THAN 101.0 F  *CHILLS WITH OR WITHOUT FEVER  NAUSEA AND VOMITING THAT IS NOT CONTROLLED WITH YOUR NAUSEA MEDICATION  *UNUSUAL SHORTNESS OF BREATH  *UNUSUAL BRUISING OR BLEEDING  TENDERNESS IN MOUTH AND THROAT WITH OR WITHOUT PRESENCE OF ULCERS  *URINARY PROBLEMS  *BOWEL PROBLEMS  UNUSUAL RASH Items with * indicate a potential emergency and should be followed up as soon as possible. If you have an emergency after office hours please contact your primary care physician or go to the nearest emergency department.  Please call the clinic during office hours if you have any questions or concerns.   You may also contact the Patient Navigator at [phone] should you have any questions or need assistance in obtaining follow up care.      Resources For Cancer Patients and their Caregivers ? American Cancer Society: Can assist with transportation, wigs, general needs, runs Look Good Feel Better.        1-888-227-6333 ? Cancer Care: Provides financial assistance, online support groups, medication/co-pay assistance.  1-800-813-HOPE (4673) ? [facility] Assists [place] cancer  patients and their families through emotional , educational and financial support.  336-427-4357 ? [place] DSS Where to apply for food stamps, Medicaid and utility assistance. 336-342-1394 ? RCATS: Transportation to medical appointments. 336-347-2287 ? Social Security Administration: May apply for disability if have a Stage IV cancer. 336-342-7796 1-800-772-1213 ? [place] Aging, Disability and Transit Services: Assists with nutrition, care and transit needs. [phone]         

## 2016-08-19 NOTE — Progress Notes (Signed)
Debbie Reyes presents today for injection per MD orders. Aranesp 545mcg administered SQ in right Upper Arm. Administration without incident. Patient tolerated well.  Chemotherapy given today per orders. Patient tolerated it well without problems. Vitals stable and discharged home from clinic ambulatory.

## 2016-08-22 ENCOUNTER — Encounter (HOSPITAL_COMMUNITY): Payer: Medicare Other | Attending: Oncology

## 2016-08-22 ENCOUNTER — Encounter (HOSPITAL_COMMUNITY): Payer: Self-pay

## 2016-08-22 VITALS — BP 114/62 | Temp 97.5°F | Resp 18

## 2016-08-22 DIAGNOSIS — C50919 Malignant neoplasm of unspecified site of unspecified female breast: Secondary | ICD-10-CM

## 2016-08-22 DIAGNOSIS — D702 Other drug-induced agranulocytosis: Secondary | ICD-10-CM

## 2016-08-22 DIAGNOSIS — D701 Agranulocytosis secondary to cancer chemotherapy: Secondary | ICD-10-CM

## 2016-08-22 MED ORDER — FILGRASTIM 300 MCG/0.5ML IJ SOSY
300.0000 ug | PREFILLED_SYRINGE | Freq: Once | INTRAMUSCULAR | Status: AC
  Administered 2016-08-22: 300 ug via SUBCUTANEOUS
  Filled 2016-08-22: qty 0.5

## 2016-08-22 NOTE — Progress Notes (Signed)
Debbie Reyes presents today for injection per MD orders. Neupagen 3107mcg administered SQ in right Abdomen. Administration without incident. Patient tolerated well. Vitals stable and discharged from clinic ambulatory. Follow up as scheduled.

## 2016-08-22 NOTE — Patient Instructions (Signed)
[place] at [facility] Discharge Instructions  RECOMMENDATIONS MADE BY THE CONSULTANT AND ANY TEST RESULTS WILL BE SENT TO YOUR REFERRING PHYSICIAN.  Neupagen given  Follow up as scheduled.  Thank you for choosing [place] at [facility] to provide your oncology and hematology care.  To afford each patient quality time with our provider, please arrive at least 15 minutes before your scheduled appointment time.    If you have a lab appointment with the [facility] please come in thru the  Main Entrance and check in at the main information desk  You need to re-schedule your appointment should you arrive 10 or more minutes late.  We strive to give you quality time with our providers, and arriving late affects you and other patients whose appointments are after yours.  Also, if you no show three or more times for appointments you may be dismissed from the clinic at the providers discretion.     Again, thank you for choosing [facility].  Our hope is that these requests will decrease the amount of time that you wait before being seen by our physicians.       _____________________________________________________________  Should you have questions after your visit to [facility], please contact our office at [phone] between the hours of 8:30 a.m. and 4:30 p.m.  Voicemails left after 4:30 p.m. will not be returned until the following business day.  For prescription refill requests, have your pharmacy contact our office.       Resources For Cancer Patients and their Caregivers ? American Cancer Society: Can assist with transportation, wigs, general needs, runs Look Good Feel Better.        604-790-6965 ? Cancer Care: Provides financial assistance, online support groups, medication/co-pay assistance.  1-800-813-HOPE 631-312-0047) ? [place] Assists [place] cancer patients and their  families through emotional , educational and financial support.  ([phone] ? [place] DSS Where to apply for food stamps, Medicaid and utility assistance. ([phone] ? RC[employer]: Transportation to medical appointments. 3801083460 ? Social Security Administration: May apply for disability if have a Stage IV cancer. 469-238-1898 2140453898 ? [employer], Disability and Transit Services: Assists with nutrition, care and transit needs. [place] Support Programs: @10RELATIVEDAYS @ > Cancer Support Group  2nd Tuesday of the month 1pm-2pm, Journey Room  > Creative Journey  3rd Tuesday of the month 1130am-1pm, Journey Room  > Look Good Feel Better  1st Wednesday of the month 10am-12 noon, Journey Room (Call [place] to register [phone])

## 2016-08-23 ENCOUNTER — Encounter (HOSPITAL_BASED_OUTPATIENT_CLINIC_OR_DEPARTMENT_OTHER): Payer: Medicare Other

## 2016-08-23 VITALS — BP 112/59 | HR 112 | Temp 97.7°F | Resp 18

## 2016-08-23 DIAGNOSIS — D701 Agranulocytosis secondary to cancer chemotherapy: Secondary | ICD-10-CM

## 2016-08-23 DIAGNOSIS — C50919 Malignant neoplasm of unspecified site of unspecified female breast: Secondary | ICD-10-CM | POA: Diagnosis not present

## 2016-08-23 DIAGNOSIS — D702 Other drug-induced agranulocytosis: Secondary | ICD-10-CM

## 2016-08-23 MED ORDER — FILGRASTIM 300 MCG/0.5ML IJ SOSY
300.0000 ug | PREFILLED_SYRINGE | Freq: Once | INTRAMUSCULAR | Status: AC
  Administered 2016-08-23: 300 ug via SUBCUTANEOUS
  Filled 2016-08-23: qty 0.5

## 2016-08-23 NOTE — Progress Notes (Signed)
Debbie Reyes presents today for injection per MD orders. Neupogen 300 mcg administered SQ in left Abdomen. Administration without incident. Patient tolerated well.

## 2016-08-23 NOTE — Patient Instructions (Signed)
[place] at [facility] Discharge Instructions  RECOMMENDATIONS MADE BY THE CONSULTANT AND ANY TEST RESULTS WILL BE SENT TO YOUR REFERRING PHYSICIAN.  Neupogen 300 mcg injection given as ordered.  Thank you for choosing [place] at [facility] to provide your oncology and hematology care.  To afford each patient quality time with our provider, please arrive at least 15 minutes before your scheduled appointment time.    If you have a lab appointment with the [facility] please come in thru the  Main Entrance and check in at the main information desk  You need to re-schedule your appointment should you arrive 10 or more minutes late.  We strive to give you quality time with our providers, and arriving late affects you and other patients whose appointments are after yours.  Also, if you no show three or more times for appointments you may be dismissed from the clinic at the providers discretion.     Again, thank you for choosing [facility].  Our hope is that these requests will decrease the amount of time that you wait before being seen by our physicians.       _____________________________________________________________  Should you have questions after your visit to [facility], please contact our office at [phone]) [phone] between the hours of 8:30 a.m. and 4:30 p.m.  Voicemails left after 4:30 p.m. will not be returned until the following business day.  For prescription refill requests, have your pharmacy contact our office.       Resources For Cancer Patients and their Caregivers ? American Cancer Society: Can assist with transportation, wigs, general needs, runs Look Good Feel Better.        [phone] ? Cancer Care: Provides financial assistance, online support groups, medication/co-pay assistance.  1-800-813-HOPE 228-214-2774) ? [place] Assists [place] cancer patients and  their families through emotional , educational and financial support.  ([phone] ? [place] DSS Where to apply for food stamps, Medicaid and utility assistance. 717-087-1402 ? [employer]ATS: Transportation to medical appointments. 606-481-0299 ? Social Security Administration: May apply for disability if have a Stage IV cancer. 205-398-3681 919-167-5119 ? [employer], Disability and Transit Services: Assists with nutrition, care and transit needs. [place] Support Programs: @10RELATIVEDAYS @ > Cancer Support Group  2nd Tuesday of the month 1pm-2pm, Journey Room  > Creative Journey  3rd Tuesday of the month 1130am-1pm, Journey Room  > Look Good Feel Better  1st Wednesday of the month 10am-12 noon, Journey Room (Call [place] to register [phone])

## 2016-09-02 ENCOUNTER — Encounter (HOSPITAL_COMMUNITY): Payer: Self-pay | Admitting: Adult Health

## 2016-09-02 ENCOUNTER — Encounter (HOSPITAL_BASED_OUTPATIENT_CLINIC_OR_DEPARTMENT_OTHER): Payer: Medicare Other | Admitting: Adult Health

## 2016-09-02 ENCOUNTER — Encounter (HOSPITAL_BASED_OUTPATIENT_CLINIC_OR_DEPARTMENT_OTHER): Payer: Medicare Other

## 2016-09-02 VITALS — BP 122/59 | HR 104 | Temp 98.0°F | Resp 18

## 2016-09-02 VITALS — BP 126/75 | HR 116 | Resp 18 | Wt 91.6 lb

## 2016-09-02 DIAGNOSIS — C50912 Malignant neoplasm of unspecified site of left female breast: Secondary | ICD-10-CM | POA: Diagnosis not present

## 2016-09-02 DIAGNOSIS — D6481 Anemia due to antineoplastic chemotherapy: Secondary | ICD-10-CM | POA: Diagnosis not present

## 2016-09-02 DIAGNOSIS — C7951 Secondary malignant neoplasm of bone: Secondary | ICD-10-CM

## 2016-09-02 DIAGNOSIS — D709 Neutropenia, unspecified: Secondary | ICD-10-CM | POA: Diagnosis not present

## 2016-09-02 DIAGNOSIS — Z5111 Encounter for antineoplastic chemotherapy: Secondary | ICD-10-CM | POA: Diagnosis not present

## 2016-09-02 DIAGNOSIS — D702 Other drug-induced agranulocytosis: Secondary | ICD-10-CM

## 2016-09-02 DIAGNOSIS — Z170 Estrogen receptor positive status [ER+]: Secondary | ICD-10-CM | POA: Diagnosis not present

## 2016-09-02 DIAGNOSIS — C50919 Malignant neoplasm of unspecified site of unspecified female breast: Secondary | ICD-10-CM

## 2016-09-02 DIAGNOSIS — C799 Secondary malignant neoplasm of unspecified site: Secondary | ICD-10-CM | POA: Diagnosis not present

## 2016-09-02 DIAGNOSIS — E538 Deficiency of other specified B group vitamins: Secondary | ICD-10-CM

## 2016-09-02 LAB — COMPREHENSIVE METABOLIC PANEL
ALT: 11 U/L — ABNORMAL LOW (ref 14–54)
AST: 31 U/L (ref 15–41)
Albumin: 3.7 g/dL (ref 3.5–5.0)
Alkaline Phosphatase: 59 U/L (ref 38–126)
Anion gap: 11 (ref 5–15)
BUN: 17 mg/dL (ref 6–20)
CHLORIDE: 103 mmol/L (ref 101–111)
CO2: 23 mmol/L (ref 22–32)
Calcium: 9.3 mg/dL (ref 8.9–10.3)
Creatinine, Ser: 0.87 mg/dL (ref 0.44–1.00)
Glucose, Bld: 107 mg/dL — ABNORMAL HIGH (ref 65–99)
POTASSIUM: 4 mmol/L (ref 3.5–5.1)
Sodium: 137 mmol/L (ref 135–145)
Total Bilirubin: 0.6 mg/dL (ref 0.3–1.2)
Total Protein: 6.8 g/dL (ref 6.5–8.1)

## 2016-09-02 LAB — CBC WITH DIFFERENTIAL/PLATELET
BASOS ABS: 0.1 10*3/uL (ref 0.0–0.1)
Basophils Relative: 1 %
EOS ABS: 0 10*3/uL (ref 0.0–0.7)
Eosinophils Relative: 0 %
HCT: 37.2 % (ref 36.0–46.0)
Hemoglobin: 11.6 g/dL — ABNORMAL LOW (ref 12.0–15.0)
Lymphocytes Relative: 13 %
Lymphs Abs: 1.4 10*3/uL (ref 0.7–4.0)
MCH: 30.7 pg (ref 26.0–34.0)
MCHC: 31.2 g/dL (ref 30.0–36.0)
MCV: 98.4 fL (ref 78.0–100.0)
MONO ABS: 1.2 10*3/uL — AB (ref 0.1–1.0)
Monocytes Relative: 11 %
NEUTROS ABS: 7.9 10*3/uL — AB (ref 1.7–7.7)
NEUTROS PCT: 75 %
PLATELETS: 255 10*3/uL (ref 150–400)
RBC: 3.78 MIL/uL — ABNORMAL LOW (ref 3.87–5.11)
RDW: 21.3 % — AB (ref 11.5–15.5)
WBC: 10.6 10*3/uL — ABNORMAL HIGH (ref 4.0–10.5)

## 2016-09-02 LAB — VITAMIN B12: Vitamin B-12: >7500 pg/mL — ABNORMAL HIGH (ref 180–914)

## 2016-09-02 MED ORDER — ONDANSETRON HCL 4 MG PO TABS
8.0000 mg | ORAL_TABLET | Freq: Once | ORAL | Status: AC
  Administered 2016-09-02: 8 mg via ORAL
  Filled 2016-09-02: qty 2

## 2016-09-02 MED ORDER — SODIUM CHLORIDE 0.9% FLUSH: 10.0000 mL | INTRAVENOUS | Status: DC | PRN

## 2016-09-02 MED ORDER — HEPARIN SOD (PORK) LOCK FLUSH 100 UNIT/ML IV SOLN
500.0000 [IU] | Freq: Once | INTRAVENOUS | Status: AC | PRN
Start: 2016-09-02 — End: 2016-09-02
  Administered 2016-09-02: 500 [IU]
  Filled 2016-09-02: qty 5

## 2016-09-02 MED ORDER — SODIUM CHLORIDE 0.9 % IV SOLN
Freq: Once | INTRAVENOUS | Status: AC
  Administered 2016-09-02: 13:00:00 via INTRAVENOUS

## 2016-09-02 MED ORDER — PROCHLORPERAZINE MALEATE 10 MG PO TABS
10.0000 mg | ORAL_TABLET | Freq: Once | ORAL | Status: AC
  Administered 2016-09-02: 10 mg via ORAL
  Filled 2016-09-02: qty 1

## 2016-09-02 MED ORDER — SODIUM CHLORIDE 0.9 % IV SOLN
1.4000 mg/m2 | Freq: Once | INTRAVENOUS | Status: AC
  Administered 2016-09-02: 2 mg via INTRAVENOUS
  Filled 2016-09-02: qty 4

## 2016-09-02 NOTE — Progress Notes (Signed)
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 Isidore Moos      11/21/2005 - 12/31/2010 Anti-estrogen oral therapy    Arimidex X 5 years      01/20/2011 PET scan    Widespread hypermetabolic osseous metastatic disease. Index lesions are given above. No evidence of hypermetabolic metastatic disease involving the neck, chest, abdomen or pelvis.       01/25/2011 -  Radiation Therapy    1200 cGy to the left rib and 2050 cGy in 20 fractions to the T5-T10 vertebral bodies with a boost of 675 cGy in 3 fractions           01/25/2011 Treatment Plan Change    Faslodex initiated approximately on this date then stopped. Stopped due to failure to respond. Faslodex given in a dose of 250 mg only 3 doses (July 17, July 31, August 14) and a single 500 mg dose March 24, 2011          04/25/2011 - 10/13/2011 Chemotherapy    Taxol chemotherapy given weekly 2 with one week off. Stopped due to progression          10/22/2011 - 05/11/2012 Anti-estrogen oral therapy    Aromasin and everolimus initiating but stopped due to progression          11/11/2011 Initial Biopsy    L3 vertebral body biopsy ER+PR+      11/11/2011 Procedure    Status post vertebral body augmentation for painful pathologic compression  fractures at T11 and at L1 using the balloon kyphoplasty technique. Status post vertebral body augmentation for painful pathologic compression fracture at T8 using the vertebroplasty technique.      12/09/2012 - 06/10/2013 Anti-estrogen oral therapy    Fareston      04/22/2013 PET scan    Hypermetabolic left upper lobe nodule, possibly metastatic. The possibility of primary bronchogenic carcinoma is also considered. Residual hypermetabolic metastatic disease involving the liver and bones. Small left pleural effusion, stable or minimally increased from 03/07/2013.      06/11/2013 - 01/08/2014 Chemotherapy    Abraxane      01/31/2014 PET scan    Progressive metastatic lymphadenopathy within the right supraclavicular region and mediastinum.New hypermetabolic left adrenal metastasis. Mixed response of hypermetabolic liver metastases. Mild decrease in hypermetabolic activity associated with 1 cm left upper lobe pulmonary nodule. Diffuse bone metastases, with several in the thorax showing increased hypermetabolic activity consistent with mild progression of bone metastases.      02/01/2014 - 05/06/2015 Chemotherapy    XELODA 750 mg/m2 7 on/7 off       06/11/2014 PET scan    Partial metabolic response. Improving mediastinal lymphadenopathy, as above. Improving left adrenal  metastasis. Prior hepatic metastasis is no longer evident.Improving multifocal osseous metastases, as above      05/04/2015 PET scan    Dominant finding is new widespread skeletal metastasis involvingthe axillary and appendicular skeleton. New lesions are not discretely seen on the CT but recognized as new metabolic activity. New LEFT adrenal gland hypermetabolic metastasis. Foci of metabolic activity in the LEFT lung are felt to relate to atelectasis and/or infection. No clear evidence of pulmonary metastasis Moderate pleural LEFT pleural which could be sampled for cytology if clinically relevant.LEFT adrenal metastasis is  actually an enlarged LEFT periaortic lymph node. This lymph node was enlarged and hypermetabolic to a greater degree FDG PET 01/31/2014 and subsequently improved and now has recurred on current exam (05/04/2015) as a hypermetabolic enlarged LEFT periaortic lymph node.      05/04/2015 Progression    PET with progression of disease      05/06/2015 Treatment Plan Change    Megace      08/14/2015 PET scan    Suspect new hypermetabolic metastasis in posterior right hepatic lobe. Consider abdomen MRI without and with contrast for further evaluation. No significant change in probable malignant left pleural effusion and left lung atelectasis.No significant change in diffuse osseous metastatic disease. Stable hypermetabolic left abdominal paraaortic lymph node, consistent with metastatic disease      09/09/2015 - 03/07/2016 Chemotherapy    IBRANCE/FASLODEX . Ibrance at 100 mg secondary to grade 3 neutropenia      03/03/2016 PET scan    Interval overall progression of disease. Interval development of new and progression of pre-existing lesions in the liver, hypermetabolic on PET imaging, consistent with metastatic disease. Persistent hypermetabolic patchy opacity in the anterior left upper lobe with persistent chronic left pleural effusion. Diffuse bony metastases again noted with interval progression ofFDG uptake associated with the index bone lesions.      03/03/2016 Progression    PET demonstrates progression of disease.      03/11/2016 -  Chemotherapy    Halaven D1, 8 every 21 days and with Neupogen support.       06/27/2016 PET scan    1. Overall mild improvement of multifocal metastatic disease. 2. Improvement in metabolic nodularity at the LEFT lung base. 3. Improvement in hepatic metastasis. 4. Overall improvement skeletal metastasis. Multiple foci of intense hypermetabolic activity remain within the skeleton remain.        HISTORY OF PRESENT ILLNESS:  (From Kirby Crigler, PA-C's  last note on 08/12/16)     INTERVAL HISTORY:  Ms. Delaurentis is here today for consideration of her next cycle of Halaven therapy. She tells me her appetite is "still not great", but is slowly beginning to improve. She is drinking about 2 Ensure nutritional supplements per day. She has lost about 4 pounds in the past month; her weight today is 91.6 lbs. She remains with fatigue, particularly since a recent upper respiratory infection. She has completed antibiotics, but still endorses some weakness and shortness of breath with exertion. Denies any nausea or vomiting. Denies any changes in her bowel or bladder. Denies any new bone pain.  She continues her oral B12 supplement; she is wondering what her serum B12 level is currently. Overall she feels improved from her recent respiratory illness, and is ready for her next cycle of treatment.   REVIEW OF SYSTEMS:  Review of Systems  Constitutional: Positive for appetite change and fatigue. Negative for chills and fever.  HENT:  Negative.   Eyes: Negative.   Respiratory: Positive  for shortness of breath.   Cardiovascular: Negative.  Negative for chest pain.  Gastrointestinal: Negative.  Negative for abdominal pain, blood in stool, constipation, diarrhea, nausea and vomiting.  Endocrine: Negative.   Genitourinary: Negative.  Negative for dysuria and hematuria.   Musculoskeletal:       No new bone pain  Skin: Negative.  Negative for rash.  Neurological: Positive for dizziness (Endorses some dizziness when going from seated to standing position too quickly). Negative for headaches.  Hematological: Negative.  Does not bruise/bleed easily.  Psychiatric/Behavioral: Negative.      PAST MEDICAL/SURGICAL HISTORY:  Past Medical History:  Diagnosis Date  . Bone metastases ([facility]) 12/29/2015  . Cancer ([facility])   . Low serum vitamin B12 03/18/2016  . Metastatic breast cancer ([facility]) 12/29/2015   History reviewed. No pertinent surgical history.   SOCIAL HISTORY:    Social History   Social History  . Marital status: Widowed    Spouse name: N/A  . Number of children: N/A  . Years of education: N/A   Occupational History  . Not on file.   Social History Main Topics  . Smoking status: Never Smoker  . Smokeless tobacco: Never Used  . Alcohol use No  . Drug use: No  . Sexual activity: No   Other Topics Concern  . Not on file   Social History Narrative  . No narrative on file    FAMILY HISTORY:  History reviewed. No pertinent family history.  CURRENT MEDICATIONS:  Outpatient Encounter Prescriptions as of 09/02/2016  Medication Sig  . albuterol (PROVENTIL HFA;VENTOLIN HFA) 108 (90 Base) MCG/ACT inhaler Inhale 2 puffs into the lungs 2 (two) times daily.  [name] aspirin EC 81 MG tablet Take 81 mg by mouth daily.  [name] denosumab (XGEVA) 120 MG/1.7ML SOLN Inject 120 mg into the skin once. Pt took on 10/13/11  . EriBULin Mesylate (HALAVEN IV) Inject into the vein. Day 1 day 8, every 21 days  . HYDROcodone-acetaminophen (NORCO/VICODIN) 5-325 MG tablet Take 1 tablet by mouth every 6 (six) hours as needed. For pain  . lidocaine-prilocaine (EMLA) cream Apply topically as needed.  [name] LORazepam (ATIVAN) 1 MG tablet Take 1 mg by mouth at bedtime as needed. For sleep  . megestrol (MEGACE) 400 MG/10ML suspension Take 10 mLs (400 mg total) by mouth daily.  . Melatonin 5 MG TABS Take by mouth at bedtime as needed.  . ondansetron (ZOFRAN) 8 MG tablet Take 1 tablet (8 mg total) by mouth 2 (two) times daily as needed (Nausea or vomiting).  [name] PARoxetine (PAXIL) 20 MG tablet Take 1 tablet (20 mg total) by mouth daily.  . potassium chloride SA (K-DUR,KLOR-CON) 20 MEQ tablet Take 0.5 tablets (10 mEq total) by mouth 2 (two) times daily.  . predniSONE (DELTASONE) 10 MG tablet Take 2 tablets daily for 4 days, then 1 tablet daily for 4 days  . prochlorperazine (COMPAZINE) 10 MG tablet Take 1 tablet (10 mg total) by mouth every 6 (six) hours as needed (Nausea or vomiting).  .  propranolol (INDERAL) 10 MG tablet Take 1 tablet (10 mg total) by mouth 2 (two) times daily.  [name] Spacer/Aero-Holding Chambers (AEROCHAMBER MV) inhaler Use as instructed   No facility-administered encounter medications on file as of 09/02/2016.     ALLERGIES:  No Known Allergies   PHYSICAL EXAM:  ECOG Performance status: 1 - Symptomatic, but largely independent.   Vitals:   09/02/16 1124  BP: 126/75  Pulse: (!) 116  Resp: 18   Filed  Weights   09/02/16 1124  Weight: 91 lb 9.6 oz (41.5 kg)    Physical Exam  Constitutional: She is oriented to person, place, and time.  Thin, frail appearing female in no distress.   HENT:  Head: Normocephalic.  Mouth/Throat: Oropharynx is clear and moist. No oropharyngeal exudate.  Eyes: Conjunctivae are normal. Pupils are equal, round, and reactive to light. No scleral icterus.  Neck: Normal range of motion. Neck supple.  Cardiovascular: Normal rate, regular rhythm and normal heart sounds.   Pulmonary/Chest: Effort normal and breath sounds normal. No respiratory distress.  Abdominal: Soft. Bowel sounds are normal. There is no tenderness.  Musculoskeletal: Normal range of motion. She exhibits no edema.  Lymphadenopathy:    She has no cervical adenopathy.  Neurological: She is alert and oriented to person, place, and time. No cranial nerve deficit. Gait normal.  Skin: Skin is warm and dry. No rash noted.  Psychiatric: Mood, memory, affect and judgment normal.      LABORATORY DATA:  I have reviewed the labs as listed.  CBC    Component Value Date/Time   WBC 10.6 (H) 09/02/2016 1126   RBC 3.78 (L) 09/02/2016 1126   HGB 11.6 (L) 09/02/2016 1126   HCT 37.2 09/02/2016 1126   PLT 255 09/02/2016 1126   MCV 98.4 09/02/2016 1126   MCH 30.7 09/02/2016 1126   MCHC 31.2 09/02/2016 1126   RDW 21.3 (H) 09/02/2016 1126   LYMPHSABS 1.4 09/02/2016 1126   MONOABS 1.2 (H) 09/02/2016 1126   EOSABS 0.0 09/02/2016 1126   BASOSABS 0.1 09/02/2016 1126    CMP Latest Ref Rng & Units 09/02/2016 08/19/2016 08/12/2016  Glucose 65 - 99 mg/dL 107(H) 94 101(H)  BUN 6 - 20 mg/dL 17 21(H) 14  Creatinine 0.44 - 1.00 mg/dL 0.87 0.91 0.80  Sodium 135 - 145 mmol/L 137 135 133(L)  Potassium 3.5 - 5.1 mmol/L 4.0 3.7 4.1  Chloride 101 - 111 mmol/L 103 102 100(L)  CO2 22 - 32 mmol/L '23 25 25  '$ Calcium 8.9 - 10.3 mg/dL 9.3 9.3 9.0  Total Protein 6.5 - 8.1 g/dL 6.8 6.3(L) 6.7  Total Bilirubin 0.3 - 1.2 mg/dL 0.6 0.3 0.5  Alkaline Phos 38 - 126 U/L 59 64 56  AST 15 - 41 U/L '31 27 24  '$ ALT 14 - 54 U/L 11(L) 13(L) 9(L)    PENDING LABS:  CA 15-3, CA 27-29, and Vitamin B12   DIAGNOSTIC IMAGING:  Last PET scan: 06/27/16     PATHOLOGY:  Bone biopsy: 11/11/11     ASSESSMENT & PLAN:   Metastatic breast cancer with bone mets:  -CA 15-3 and CA 27.29 have been slowly decreasing over time.  Tumor markers pending for today.  -Here today for cycle #9 Halaven; she has tolerated treatment quite well.  -Labs reviewed and are adequate for therapy today.  -Return to cancer center for follow-up in 3 weeks for consideration of cycle #10 Halaven.   Bone mets:  -Continue Xgeva as scheduled.   Protein-calorie malnutrition:  -She continues to lose weight. Weight today is 91.6 lbs. Weight loss could be secondary to recent viral illness, treatment effects, or metastatic disease itself.  -Encouraged her to increase her Ensure supplements to 3 times/day, eat smaller meals more often that are high in calories and protein. Reinforced the importance of adequate nutrition while undergoing treatment.  -I will touch base with Burtis Junes, RD to see if he has any additional suggestions for the patient regarding her weight loss/nutrition  needs.   B-12 deficiency:  -If her serum B-12 is low despite oral supplementation, then we can begin giving her B-12 injections weekly x 4, then monthly. Awaiting lab results. We will contact patient when results are made available.      Dispo:  -Return to cancer center in 3 weeks for next cycle Halaven.    All questions were answered to patient's stated satisfaction. Encouraged patient to call with any new concerns or questions before her next visit to the cancer center and we can certain see her sooner, if needed.       Orders placed this encounter:  No orders of the defined types were placed in this encounter.     Mike Craze, NP [place] [phone]

## 2016-09-02 NOTE — Progress Notes (Signed)
Tolerated tx w/o adverse reaction.  Alert, in no distress.  VSS.  Discharged ambulatory. 

## 2016-09-02 NOTE — Patient Instructions (Addendum)
[place] at [facility] Discharge Instructions  RECOMMENDATIONS MADE BY THE CONSULTANT AND ANY TEST RESULTS WILL BE SENT TO YOUR REFERRING PHYSICIAN.  Exam with Mike Craze, NP. Return to the clinic for treatment and follow up in 3 weeks. Please see Amy as you leave for appointments.     Thank you for choosing [place] at [facility] to provide your oncology and hematology care.  To afford each patient quality time with our provider, please arrive at least 15 minutes before your scheduled appointment time.    If you have a lab appointment with the [facility] please come in thru the  Main Entrance and check in at the main information desk  You need to re-schedule your appointment should you arrive 10 or more minutes late.  We strive to give you quality time with our providers, and arriving late affects you and other patients whose appointments are after yours.  Also, if you no show three or more times for appointments you may be dismissed from the clinic at the providers discretion.     Again, thank you for choosing [facility].  Our hope is that these requests will decrease the amount of time that you wait before being seen by our physicians.       _____________________________________________________________  Should you have questions after your visit to [facility], please contact our office at [phone] between the hours of 8:30 a.m. and 4:30 p.m.  Voicemails left after 4:30 p.m. will not be returned until the following business day.  For prescription refill requests, have your pharmacy contact our office.       Resources For Cancer Patients and their Caregivers ? American Cancer Society: Can assist with transportation, wigs, general needs, runs Look Good Feel Better.        8060823629 ? Cancer Care: Provides financial assistance, online support groups, medication/co-pay assistance.   1-800-813-HOPE 239-780-9802) ? [place] Assists [place] cancer patients and their families through emotional , educational and financial support.  807-360-9287 ? [place] DSS Where to apply for food stamps, Medicaid and utility assistance. 334-309-5286 ? RCATS: Transportation to medical appointments. 450-260-0448 ? Social Security Administration: May apply for disability if have a Stage IV cancer. 312-302-4184 337-198-5008 ? [employer], Disability and Transit Services: Assists with nutrition, care and transit needs. [place] Support Programs: @10RELATIVEDAYS @ > Cancer Support Group  2nd Tuesday of the month 1pm-2pm, Journey Room  > Creative Journey  3rd Tuesday of the month 1130am-1pm, Journey Room  > Look Good Feel Better  1st Wednesday of the month 10am-12 noon, Journey Room (Call [place] to register [phone])

## 2016-09-03 LAB — CANCER ANTIGEN 27.29: CA 27.29: 369.6 U/mL — ABNORMAL HIGH (ref 0.0–38.6)

## 2016-09-03 LAB — CANCER ANTIGEN 15-3: CAN 15 3: 271.5 U/mL — AB (ref 0.0–25.0)

## 2016-09-05 ENCOUNTER — Encounter (HOSPITAL_BASED_OUTPATIENT_CLINIC_OR_DEPARTMENT_OTHER): Payer: Medicare Other

## 2016-09-05 ENCOUNTER — Encounter (HOSPITAL_COMMUNITY): Payer: Self-pay

## 2016-09-05 VITALS — BP 124/71 | HR 110 | Temp 97.6°F | Resp 18

## 2016-09-05 DIAGNOSIS — C50919 Malignant neoplasm of unspecified site of unspecified female breast: Secondary | ICD-10-CM

## 2016-09-05 DIAGNOSIS — D701 Agranulocytosis secondary to cancer chemotherapy: Secondary | ICD-10-CM

## 2016-09-05 DIAGNOSIS — D702 Other drug-induced agranulocytosis: Secondary | ICD-10-CM

## 2016-09-05 MED ORDER — FILGRASTIM 300 MCG/0.5ML IJ SOSY
300.0000 ug | PREFILLED_SYRINGE | Freq: Once | INTRAMUSCULAR | Status: AC
  Administered 2016-09-05: 300 ug via SUBCUTANEOUS
  Filled 2016-09-05: qty 0.5

## 2016-09-05 NOTE — Patient Instructions (Signed)
[place] at [facility] Discharge Instructions  RECOMMENDATIONS MADE BY THE CONSULTANT AND ANY TEST RESULTS WILL BE SENT TO YOUR REFERRING PHYSICIAN.  Neupogen injection today. Return as scheduled.   Thank you for choosing [place] at [facility] to provide your oncology and hematology care.  To afford each patient quality time with our provider, please arrive at least 15 minutes before your scheduled appointment time.    If you have a lab appointment with the [facility] please come in thru the  Main Entrance and check in at the main information desk  You need to re-schedule your appointment should you arrive 10 or more minutes late.  We strive to give you quality time with our providers, and arriving late affects you and other patients whose appointments are after yours.  Also, if you no show three or more times for appointments you may be dismissed from the clinic at the providers discretion.     Again, thank you for choosing [facility].  Our hope is that these requests will decrease the amount of time that you wait before being seen by our physicians.       _____________________________________________________________  Should you have questions after your visit to [facility], please contact our office at [phone] between the hours of 8:30 a.m. and 4:30 p.m.  Voicemails left after 4:30 p.m. will not be returned until the following business day.  For prescription refill requests, have your pharmacy contact our office.       Resources For Cancer Patients and their Caregivers ? American Cancer Society: Can assist with transportation, wigs, general needs, runs Look Good Feel Better.        ([phone] [phone] ? Cancer Care: Provides financial assistance, online support groups, medication/co-pay assistance.  1-800-813-HOPE 315-378-3044) ? [place] Assists [place] cancer patients and  their families through emotional , educational and financial support.  6040378283 ? [place] DSS Where to apply for food stamps, Medicaid and utility assistance. 867-281-6128 ? RCATS: Transportation to medical appointments. 4352590761 ? Social Security Administration: May apply for disability if have a Stage IV cancer. (603) 359-7938 (724)404-6229 ? [employer], Disability and Transit Services: Assists with nutrition, care and transit needs. [place] Support Programs: @10RELATIVEDAYS @ > Cancer Support Group  2nd Tuesday of the month 1pm-2pm, Journey Room  > Creative Journey  3rd Tuesday of the month 1130am-1pm, Journey Room  > Look Good Feel Better  1st Wednesday of the month 10am-12 noon, Journey Room (Call [place] to register [phone])

## 2016-09-05 NOTE — Progress Notes (Signed)
Debbie Reyes presents today for injection per the provider's orders.  Neupogen administration without incident; see MAR for injection details.  Patient tolerated procedure well and without incident.  No questions or complaints noted at this time.

## 2016-09-06 ENCOUNTER — Encounter (HOSPITAL_COMMUNITY): Payer: Self-pay

## 2016-09-06 ENCOUNTER — Encounter (HOSPITAL_BASED_OUTPATIENT_CLINIC_OR_DEPARTMENT_OTHER): Payer: Medicare Other

## 2016-09-06 VITALS — BP 115/58 | HR 110 | Temp 97.5°F | Resp 18

## 2016-09-06 DIAGNOSIS — D702 Other drug-induced agranulocytosis: Secondary | ICD-10-CM

## 2016-09-06 DIAGNOSIS — D701 Agranulocytosis secondary to cancer chemotherapy: Secondary | ICD-10-CM | POA: Diagnosis present

## 2016-09-06 DIAGNOSIS — C50919 Malignant neoplasm of unspecified site of unspecified female breast: Secondary | ICD-10-CM

## 2016-09-06 MED ORDER — FILGRASTIM 300 MCG/0.5ML IJ SOSY
300.0000 ug | PREFILLED_SYRINGE | Freq: Once | INTRAMUSCULAR | Status: AC
  Administered 2016-09-06: 300 ug via SUBCUTANEOUS
  Filled 2016-09-06: qty 0.5

## 2016-09-06 NOTE — Progress Notes (Signed)
Debbie Reyes tolerated Neupogen injection well without complaints or incident. VSS Pt discharged self ambulatory in satisfactory condition

## 2016-09-06 NOTE — Patient Instructions (Signed)
[place] at [facility] Discharge Instructions  RECOMMENDATIONS MADE BY THE CONSULTANT AND ANY TEST RESULTS WILL BE SENT TO YOUR REFERRING PHYSICIAN.  Received Neupogen injection today. Follow-up as scheduled. Call clinic for any questions or concerns  Thank you for choosing [place] at [facility] to provide your oncology and hematology care.  To afford each patient quality time with our provider, please arrive at least 15 minutes before your scheduled appointment time.    If you have a lab appointment with the [facility] please come in thru the  Main Entrance and check in at the main information desk  You need to re-schedule your appointment should you arrive 10 or more minutes late.  We strive to give you quality time with our providers, and arriving late affects you and other patients whose appointments are after yours.  Also, if you no show three or more times for appointments you may be dismissed from the clinic at the providers discretion.     Again, thank you for choosing [facility].  Our hope is that these requests will decrease the amount of time that you wait before being seen by our physicians.       _____________________________________________________________  Should you have questions after your visit to [facility], please contact our office at [phone] between the hours of 8:30 a.m. and 4:30 p.m.  Voicemails left after 4:30 p.m. will not be returned until the following business day.  For prescription refill requests, have your pharmacy contact our office.       Resources For Cancer Patients and their Caregivers ? American Cancer Society: Can assist with transportation, wigs, general needs, runs Look Good Feel Better.        236-553-7115 ? Cancer Care: Provides financial assistance, online support groups, medication/co-pay assistance.  1-800-813-HOPE 515-641-8681) ? [place] Assists [place] cancer patients and their families through emotional , educational and financial support.  (289)563-2906 ? [place] DSS Where to apply for food stamps, Medicaid and utility assistance. (501) 858-3726 ? RCATS: Transportation to medical appointments. 760 234 9251 ? Social Security Administration: May apply for disability if have a Stage IV cancer. 407-757-3973 270-527-0993 ? [employer], Disability and Transit Services: Assists with nutrition, care and transit needs. [place] Support Programs: @10RELATIVEDAYS @ > Cancer Support Group  2nd Tuesday of the month 1pm-2pm, Journey Room  > Creative Journey  3rd Tuesday of the month 1130am-1pm, Journey Room  > Look Good Feel Better  1st Wednesday of the month 10am-12 noon, Journey Room (Call [place] to register [phone])

## 2016-09-09 ENCOUNTER — Encounter (HOSPITAL_COMMUNITY): Payer: Self-pay

## 2016-09-09 ENCOUNTER — Encounter: Payer: Self-pay | Admitting: Dietician

## 2016-09-09 ENCOUNTER — Encounter (HOSPITAL_COMMUNITY): Payer: Medicare Other | Attending: Hematology & Oncology

## 2016-09-09 ENCOUNTER — Encounter (HOSPITAL_COMMUNITY): Payer: Medicare Other

## 2016-09-09 VITALS — BP 111/62 | HR 92 | Temp 97.4°F | Resp 18

## 2016-09-09 DIAGNOSIS — C799 Secondary malignant neoplasm of unspecified site: Secondary | ICD-10-CM | POA: Insufficient documentation

## 2016-09-09 DIAGNOSIS — C7951 Secondary malignant neoplasm of bone: Secondary | ICD-10-CM

## 2016-09-09 DIAGNOSIS — D702 Other drug-induced agranulocytosis: Secondary | ICD-10-CM

## 2016-09-09 DIAGNOSIS — Z5111 Encounter for antineoplastic chemotherapy: Secondary | ICD-10-CM

## 2016-09-09 DIAGNOSIS — C50919 Malignant neoplasm of unspecified site of unspecified female breast: Secondary | ICD-10-CM | POA: Diagnosis not present

## 2016-09-09 LAB — COMPREHENSIVE METABOLIC PANEL
ALBUMIN: 3.6 g/dL (ref 3.5–5.0)
ALK PHOS: 67 U/L (ref 38–126)
ALT: 13 U/L — AB (ref 14–54)
AST: 37 U/L (ref 15–41)
Anion gap: 10 (ref 5–15)
BUN: 14 mg/dL (ref 6–20)
CALCIUM: 9.3 mg/dL (ref 8.9–10.3)
CHLORIDE: 99 mmol/L — AB (ref 101–111)
CO2: 26 mmol/L (ref 22–32)
CREATININE: 0.84 mg/dL (ref 0.44–1.00)
GFR calc Af Amer: >60 mL/min (ref >60)
GFR calc non Af Amer: >60 mL/min (ref >60)
GLUCOSE: 116 mg/dL — AB (ref 65–99)
Potassium: 3.9 mmol/L (ref 3.5–5.1)
SODIUM: 135 mmol/L (ref 135–145)
Total Bilirubin: 0.6 mg/dL (ref 0.3–1.2)
Total Protein: 6.8 g/dL (ref 6.5–8.1)

## 2016-09-09 LAB — CBC WITH DIFFERENTIAL/PLATELET
BASOS PCT: 1 %
Basophils Absolute: 0.1 10*3/uL (ref 0.0–0.1)
EOS ABS: 0 10*3/uL (ref 0.0–0.7)
Eosinophils Relative: 0 %
HCT: 35.9 % — ABNORMAL LOW (ref 36.0–46.0)
HEMOGLOBIN: 11.3 g/dL — AB (ref 12.0–15.0)
Lymphocytes Relative: 15 %
Lymphs Abs: 1.4 10*3/uL (ref 0.7–4.0)
MCH: 30.5 pg (ref 26.0–34.0)
MCHC: 31.5 g/dL (ref 30.0–36.0)
MCV: 97 fL (ref 78.0–100.0)
MONOS PCT: 19 %
Monocytes Absolute: 1.8 10*3/uL — ABNORMAL HIGH (ref 0.1–1.0)
NEUTROS ABS: 6 10*3/uL (ref 1.7–7.7)
Neutrophils Relative %: 65 %
PLATELETS: 272 10*3/uL (ref 150–400)
RBC: 3.7 MIL/uL — AB (ref 3.87–5.11)
RDW: 20.7 % — ABNORMAL HIGH (ref 11.5–15.5)
WBC: 9.3 10*3/uL (ref 4.0–10.5)

## 2016-09-09 MED ORDER — ONDANSETRON HCL 4 MG PO TABS
ORAL_TABLET | ORAL | Status: AC
  Filled 2016-09-09: qty 1

## 2016-09-09 MED ORDER — ONDANSETRON HCL 4 MG PO TABS
8.0000 mg | ORAL_TABLET | Freq: Once | ORAL | Status: AC
  Administered 2016-09-09: 8 mg via ORAL
  Filled 2016-09-09: qty 2

## 2016-09-09 MED ORDER — SODIUM CHLORIDE 0.9 % IV SOLN
1.4000 mg/m2 | Freq: Once | INTRAVENOUS | Status: AC
  Administered 2016-09-09: 2 mg via INTRAVENOUS
  Filled 2016-09-09: qty 4

## 2016-09-09 MED ORDER — HEPARIN SOD (PORK) LOCK FLUSH 100 UNIT/ML IV SOLN
500.0000 [IU] | Freq: Once | INTRAVENOUS | Status: AC | PRN
  Administered 2016-09-09: 500 [IU]
  Filled 2016-09-09 (×2): qty 5

## 2016-09-09 MED ORDER — PROCHLORPERAZINE MALEATE 10 MG PO TABS
10.0000 mg | ORAL_TABLET | Freq: Once | ORAL | Status: AC
  Administered 2016-09-09: 10 mg via ORAL
  Filled 2016-09-09: qty 1

## 2016-09-09 MED ORDER — SODIUM CHLORIDE 0.9% FLUSH
10.0000 mL | INTRAVENOUS | Status: DC | PRN
  Administered 2016-09-09: 10 mL
  Filled 2016-09-09: qty 10

## 2016-09-09 MED ORDER — SODIUM CHLORIDE 0.9 % IV SOLN
Freq: Once | INTRAVENOUS | Status: AC
  Administered 2016-09-09: 14:00:00 via INTRAVENOUS

## 2016-09-09 NOTE — Progress Notes (Signed)
Debbie Reyes tolerated chemo tx well without complaints or incident. Labs reviewed prior to administering chemotherapy. Hgb 11.3 so Aranesp held. VSS upon discharge. Pt discharged self ambulatory in satisfactory condition

## 2016-09-09 NOTE — Progress Notes (Signed)
RD consulted to meet with pt due to recent severe wt loss.  Contacted Pt by visiting during infusion   Wt Readings from Last 10 Encounters:  09/02/16 91 lb 9.6 oz (41.5 kg)  08/12/16 93 lb 3.2 oz (42.3 kg)  07/22/16 95 lb (43.1 kg)  07/08/16 99 lb 3.2 oz (45 kg)  07/01/16 100 lb (45.4 kg)  06/28/16 99 lb 9.6 oz (45.2 kg)  06/17/16 101 lb 3.2 oz (45.9 kg)  06/10/16 100 lb 14.4 oz (45.8 kg)  05/20/16 101 lb (45.8 kg)  05/13/16 101 lb 12.8 oz (46.2 kg)   Patient's wt appeared to be stable at 100-101 up until the end of December. She has lost ~10 lbs since then, which is ~10% of her body weight.   Patient reports that she was stable, but in the last couple months suffered from a 6 week acute illness. Due to this illness, further poor appetite, malaise and fatigue were superimposed on her already existing lack of hunger and energy. In addition to a poor appetite, she gets full with minimal PO intake.  She denies any other side effects such as n/v/c/d and feels relatively good.  Patient reports eating little more than 1 meal a day. She says she "stays up late and gets up late". Her first intake is a "chcolate float" she makes with coke and Boost/ensure. Other than this, she will typically only eat 1 other time during the day and maybe drink 1 other Ensure. She gave example of how when she was out running errands yesterday, she went to [employer] and got a whopper and fries, but only ate half because she got full quickly  RD had met with patient last September, at which time she was about to begin megace. Today, she says she only ever took "a few doses" due to the poor taste of the medication. She knows she needs to retry using this. RD and patient brainstormed ways she could get around poor palatability by either mixing it with other beverages or taking a sip of Boost immediately after each sip of Megace.  Her meal quality seems to be fairly good. She eats high fat/starchy foods (hamburger and  fries) and does not drink low kcal beverages- mostly drinks soda and sweet tea. She occasionally drinks reduced fat milk. RD asked her to switch to whole milk.   RD presented with Ensure Case today. This is her first from the cancer center.    Instructed to drink 2-3 daily in addition to her meals. She is urged to restart megace. RD will follow up in 10 days or so.   Pt appears to be overall well-appearing and says she feels good other than her wt loss/appetite. She is motivated to gain weight.   RD to follow   Burtis Junes RD, LDN, [facility] Pager: [phone] 09/09/2016 1:21 PM

## 2016-09-09 NOTE — Patient Instructions (Signed)
[facility] Discharge Instructions for Patients Receiving Chemotherapy   Beginning January 23rd 2017 lab work for the [facility] will be done in the  Main lab at [facility] on 1st floor. If you have a lab appointment with the [facility] please come in thru the  Main Entrance and check in at the main information desk   Today you received the following chemotherapy agents [place]. Follow-up as scheduled. Call clinic for any questions or concerns  To help prevent nausea and vomiting after your treatment, we encourage you to take your nausea medication   If you develop nausea and vomiting, or diarrhea that is not controlled by your medication, call the clinic.  The clinic phone number is [phone]. Office hours are Monday-Friday 8:30am-5:00pm.  BELOW ARE SYMPTOMS THAT SHOULD BE REPORTED IMMEDIATELY:  *FEVER GREATER THAN 101.0 F  *CHILLS WITH OR WITHOUT FEVER  NAUSEA AND VOMITING THAT IS NOT CONTROLLED WITH YOUR NAUSEA MEDICATION  *UNUSUAL SHORTNESS OF BREATH  *UNUSUAL BRUISING OR BLEEDING  TENDERNESS IN MOUTH AND THROAT WITH OR WITHOUT PRESENCE OF ULCERS  *URINARY PROBLEMS  *BOWEL PROBLEMS  UNUSUAL RASH Items with * indicate a potential emergency and should be followed up as soon as possible. If you have an emergency after office hours please contact your primary care physician or go to the nearest emergency department.  Please call the clinic during office hours if you have any questions or concerns.   You may also contact the Patient Navigator at [phone] should you have any questions or need assistance in obtaining follow up care.      Resources For Cancer Patients and their Caregivers ? American Cancer Society: Can assist with transportation, wigs, general needs, runs Look Good Feel Better.        816-087-6433 ? Cancer Care: Provides financial assistance, online support groups, medication/co-pay assistance.  1-800-813-HOPE  563-455-4174) ? [place] Assists [place] cancer patients and their families through emotional , educational and financial support.  ([phone] ? [place] DSS Where to apply for food stamps, Medicaid and utility assistance. 302-605-2645 ? RCATS: Transportation to medical appointments. 717-645-4394 ? Social Security Administration: May apply for disability if have a Stage IV cancer. [phone] 917-019-8954 ? [employer], Disability and Transit Services: Assists with nutrition, care and transit needs. [phone]

## 2016-09-09 NOTE — Progress Notes (Signed)
See infusion encounter.  

## 2016-09-12 ENCOUNTER — Encounter (HOSPITAL_COMMUNITY): Payer: Self-pay

## 2016-09-12 ENCOUNTER — Encounter (HOSPITAL_BASED_OUTPATIENT_CLINIC_OR_DEPARTMENT_OTHER): Payer: Medicare Other

## 2016-09-12 VITALS — BP 106/59 | HR 106 | Temp 98.0°F | Resp 18

## 2016-09-12 DIAGNOSIS — C50919 Malignant neoplasm of unspecified site of unspecified female breast: Secondary | ICD-10-CM

## 2016-09-12 DIAGNOSIS — D701 Agranulocytosis secondary to cancer chemotherapy: Secondary | ICD-10-CM

## 2016-09-12 DIAGNOSIS — D702 Other drug-induced agranulocytosis: Secondary | ICD-10-CM

## 2016-09-12 MED ORDER — FILGRASTIM 300 MCG/0.5ML IJ SOSY
300.0000 ug | PREFILLED_SYRINGE | Freq: Once | INTRAMUSCULAR | Status: AC
  Administered 2016-09-12: 300 ug via SUBCUTANEOUS
  Filled 2016-09-12: qty 0.5

## 2016-09-12 NOTE — Progress Notes (Signed)
Debbie Reyes tolerated Neupogen injection well without complaints or incident. VSS Pt discharged self ambulatory in satisfactory condition

## 2016-09-12 NOTE — Patient Instructions (Signed)
[place] at [facility] Discharge Instructions  RECOMMENDATIONS MADE BY THE CONSULTANT AND ANY TEST RESULTS WILL BE SENT TO YOUR REFERRING PHYSICIAN.  RECEIVED NEUPOGEN INJECTION TODAY. FOLLOW-UP AS SCHEDULED. CALL CLINIC FOR ANY QUESTIONS OR CONCERNS  Thank you for choosing [place] at [facility] to provide your oncology and hematology care.  To afford each patient quality time with our provider, please arrive at least 15 minutes before your scheduled appointment time.    If you have a lab appointment with the [facility] please come in thru the  Main Entrance and check in at the main information desk  You need to re-schedule your appointment should you arrive 10 or more minutes late.  We strive to give you quality time with our providers, and arriving late affects you and other patients whose appointments are after yours.  Also, if you no show three or more times for appointments you may be dismissed from the clinic at the providers discretion.     Again, thank you for choosing [facility].  Our hope is that these requests will decrease the amount of time that you wait before being seen by our physicians.       _____________________________________________________________  Should you have questions after your visit to [facility], please contact our office at [phone] between the hours of 8:30 a.m. and 4:30 p.m.  Voicemails left after 4:30 p.m. will not be returned until the following business day.  For prescription refill requests, have your pharmacy contact our office.       Resources For Cancer Patients and their Caregivers ? American Cancer Society: Can assist with transportation, wigs, general needs, runs Look Good Feel Better.        726-810-7035 ? Cancer Care: Provides financial assistance, online support groups, medication/co-pay assistance.  1-800-813-HOPE 918-258-7929) ? [place] Assists [place] cancer patients and their families through emotional , educational and financial support.  217-502-3591 ? [place] DSS Where to apply for food stamps, Medicaid and utility assistance. (682)577-9168 ? RCATS: Transportation to medical appointments. [phone] ? Social Security Administration: May apply for disability if have a Stage IV cancer. 706-685-5369 [phone] ? [employer], Disability and Transit Services: Assists with nutrition, care and transit needs. [place] Support Programs: @10RELATIVEDAYS @ > Cancer Support Group  2nd Tuesday of the month 1pm-2pm, Journey Room  > Creative Journey  3rd Tuesday of the month 1130am-1pm, Journey Room  > Look Good Feel Better  1st Wednesday of the month 10am-12 noon, Journey Room (Call [place] to register [phone])

## 2016-09-13 ENCOUNTER — Encounter (HOSPITAL_COMMUNITY): Payer: Medicare Other | Attending: Oncology

## 2016-09-13 VITALS — BP 101/54 | HR 106 | Temp 97.5°F | Resp 18

## 2016-09-13 DIAGNOSIS — D702 Other drug-induced agranulocytosis: Secondary | ICD-10-CM

## 2016-09-13 DIAGNOSIS — D701 Agranulocytosis secondary to cancer chemotherapy: Secondary | ICD-10-CM | POA: Diagnosis present

## 2016-09-13 DIAGNOSIS — C50919 Malignant neoplasm of unspecified site of unspecified female breast: Secondary | ICD-10-CM | POA: Diagnosis not present

## 2016-09-13 MED ORDER — FILGRASTIM 300 MCG/0.5ML IJ SOSY
300.0000 ug | PREFILLED_SYRINGE | Freq: Once | INTRAMUSCULAR | Status: AC
  Administered 2016-09-13: 300 ug via SUBCUTANEOUS
  Filled 2016-09-13: qty 0.5

## 2016-09-13 MED ORDER — OCTREOTIDE ACETATE 30 MG IM KIT
PACK | INTRAMUSCULAR | Status: AC
  Filled 2016-09-13: qty 2

## 2016-09-13 NOTE — Progress Notes (Signed)
Debbie Reyes presents today for injection per the provider's orders.  Neupogen administration without incident; see MAR for injection details.  Patient tolerated procedure well and without incident.  No questions or complaints noted at this time.

## 2016-09-13 NOTE — Patient Instructions (Signed)
[place] at [facility] Discharge Instructions  RECOMMENDATIONS MADE BY THE CONSULTANT AND ANY TEST RESULTS WILL BE SENT TO YOUR REFERRING PHYSICIAN.  Neupogen injection today. Return as scheduled.   Thank you for choosing [place] at [facility] to provide your oncology and hematology care.  To afford each patient quality time with our provider, please arrive at least 15 minutes before your scheduled appointment time.    If you have a lab appointment with the [facility] please come in thru the  Main Entrance and check in at the main information desk  You need to re-schedule your appointment should you arrive 10 or more minutes late.  We strive to give you quality time with our providers, and arriving late affects you and other patients whose appointments are after yours.  Also, if you no show three or more times for appointments you may be dismissed from the clinic at the providers discretion.     Again, thank you for choosing [facility].  Our hope is that these requests will decrease the amount of time that you wait before being seen by our physicians.       _____________________________________________________________  Should you have questions after your visit to [facility], please contact our office at [phone] between the hours of 8:30 a.m. and 4:30 p.m.  Voicemails left after 4:30 p.m. will not be returned until the following business day.  For prescription refill requests, have your pharmacy contact our office.       Resources For Cancer Patients and their Caregivers ? American Cancer Society: Can assist with transportation, wigs, general needs, runs Look Good Feel Better.        249-337-1128 ? Cancer Care: Provides financial assistance, online support groups, medication/co-pay assistance.  1-800-813-HOPE 506-859-7231) ? [place] Assists [place] cancer patients and  their families through emotional , educational and financial support.  (640)392-7004 ? [place] DSS Where to apply for food stamps, Medicaid and utility assistance. 740-401-5329 ? RCATS: Transportation to medical appointments. 323-868-4934 ? Social Security Administration: May apply for disability if have a Stage IV cancer. 548-163-3516 [phone] ? [employer], Disability and Transit Services: Assists with nutrition, care and transit needs. [place] Support Programs: @10RELATIVEDAYS @ > Cancer Support Group  2nd Tuesday of the month 1pm-2pm, Journey Room  > Creative Journey  3rd Tuesday of the month 1130am-1pm, Journey Room  > Look Good Feel Better  1st Wednesday of the month 10am-12 noon, Journey Room (Call [place] to register [phone])

## 2016-09-16 ENCOUNTER — Encounter (HOSPITAL_COMMUNITY): Payer: Self-pay

## 2016-09-16 ENCOUNTER — Encounter (HOSPITAL_COMMUNITY): Payer: Medicare Other | Attending: Oncology

## 2016-09-16 VITALS — BP 123/72 | HR 119 | Temp 97.8°F | Resp 16

## 2016-09-16 DIAGNOSIS — C7951 Secondary malignant neoplasm of bone: Secondary | ICD-10-CM

## 2016-09-16 DIAGNOSIS — C50919 Malignant neoplasm of unspecified site of unspecified female breast: Secondary | ICD-10-CM | POA: Diagnosis not present

## 2016-09-16 MED ORDER — DENOSUMAB 120 MG/1.7ML ~~LOC~~ SOLN
120.0000 mg | Freq: Once | SUBCUTANEOUS | Status: AC
  Administered 2016-09-16: 120 mg via SUBCUTANEOUS
  Filled 2016-09-16: qty 1.7

## 2016-09-16 NOTE — Progress Notes (Signed)
Pt in clinic today for Aranesp and Xgeva injections. Pt Hgb a week ago was 11.3, Dr. Talbert Cage said to hold Aranesp today. Pt's calcium was 9.3 a week ago Dr. Talbert Cage said to give [place] See, ok to use labs from 09/09/16. Pt given Xgeva injection in right lower abdomen. Pt tolerated well. Pt stable and discharged home ambulatory. Pt to return to clinic as scheduled.

## 2016-09-19 ENCOUNTER — Encounter (HOSPITAL_COMMUNITY): Payer: Medicare Other

## 2016-09-20 ENCOUNTER — Ambulatory Visit (HOSPITAL_COMMUNITY): Payer: Medicare Other

## 2016-09-20 ENCOUNTER — Ambulatory Visit (HOSPITAL_COMMUNITY): Payer: Medicare Other | Admitting: Adult Health

## 2016-09-27 ENCOUNTER — Encounter (HOSPITAL_COMMUNITY)
Admission: RE | Admit: 2016-09-27 | Discharge: 2016-09-27 | Disposition: A | Discharge status: Home / Self Care | Payer: Medicare Other | Source: Ambulatory Visit | Attending: Oncology | Admitting: Oncology

## 2016-09-27 DIAGNOSIS — C7951 Secondary malignant neoplasm of bone: Secondary | ICD-10-CM | POA: Diagnosis not present

## 2016-09-27 DIAGNOSIS — C50919 Malignant neoplasm of unspecified site of unspecified female breast: Secondary | ICD-10-CM

## 2016-09-27 LAB — GLUCOSE, CAPILLARY: Glucose-Capillary: 89 mg/dL (ref 65–99)

## 2016-09-27 MED ORDER — FLUDEOXYGLUCOSE F - 18 (FDG) INJECTION: 5.0000 | Freq: Once | INTRAVENOUS | Status: DC | PRN

## 2016-09-30 ENCOUNTER — Encounter (HOSPITAL_COMMUNITY): Payer: Medicare Other

## 2016-09-30 ENCOUNTER — Encounter: Payer: Self-pay | Admitting: Dietician

## 2016-09-30 ENCOUNTER — Ambulatory Visit (HOSPITAL_COMMUNITY): Payer: Medicare Other

## 2016-09-30 ENCOUNTER — Encounter (HOSPITAL_BASED_OUTPATIENT_CLINIC_OR_DEPARTMENT_OTHER): Payer: Medicare Other | Admitting: Hematology

## 2016-09-30 ENCOUNTER — Encounter (HOSPITAL_COMMUNITY): Payer: Self-pay | Admitting: Hematology

## 2016-09-30 VITALS — BP 127/79 | HR 106 | Resp 18 | Wt 93.1 lb

## 2016-09-30 DIAGNOSIS — C787 Secondary malignant neoplasm of liver and intrahepatic bile duct: Secondary | ICD-10-CM

## 2016-09-30 DIAGNOSIS — C7951 Secondary malignant neoplasm of bone: Secondary | ICD-10-CM

## 2016-09-30 DIAGNOSIS — C50919 Malignant neoplasm of unspecified site of unspecified female breast: Secondary | ICD-10-CM

## 2016-09-30 DIAGNOSIS — C799 Secondary malignant neoplasm of unspecified site: Secondary | ICD-10-CM | POA: Diagnosis not present

## 2016-09-30 DIAGNOSIS — E538 Deficiency of other specified B group vitamins: Secondary | ICD-10-CM | POA: Diagnosis not present

## 2016-09-30 DIAGNOSIS — Z170 Estrogen receptor positive status [ER+]: Secondary | ICD-10-CM | POA: Diagnosis not present

## 2016-09-30 DIAGNOSIS — Z7189 Other specified counseling: Secondary | ICD-10-CM

## 2016-09-30 LAB — COMPREHENSIVE METABOLIC PANEL
ALBUMIN: 3.8 g/dL (ref 3.5–5.0)
ALT: 15 U/L (ref 14–54)
AST: 30 U/L (ref 15–41)
Alkaline Phosphatase: 70 U/L (ref 38–126)
Anion gap: 10 (ref 5–15)
BUN: 25 mg/dL — ABNORMAL HIGH (ref 6–20)
CHLORIDE: 102 mmol/L (ref 101–111)
CO2: 24 mmol/L (ref 22–32)
CREATININE: 0.89 mg/dL (ref 0.44–1.00)
Calcium: 9.6 mg/dL (ref 8.9–10.3)
GFR calc non Af Amer: >60 mL/min (ref >60)
GLUCOSE: 100 mg/dL — AB (ref 65–99)
Potassium: 3.9 mmol/L (ref 3.5–5.1)
SODIUM: 136 mmol/L (ref 135–145)
Total Bilirubin: 0.3 mg/dL (ref 0.3–1.2)
Total Protein: 7.5 g/dL (ref 6.5–8.1)

## 2016-09-30 LAB — CBC WITH DIFFERENTIAL/PLATELET
BASOS ABS: 0 10*3/uL (ref 0.0–0.1)
BASOS PCT: 0 %
EOS ABS: 0.1 10*3/uL (ref 0.0–0.7)
EOS PCT: 1 %
HCT: 36.6 % (ref 36.0–46.0)
Hemoglobin: 11.8 g/dL — ABNORMAL LOW (ref 12.0–15.0)
Lymphocytes Relative: 16 %
Lymphs Abs: 1.1 10*3/uL (ref 0.7–4.0)
MCH: 30.6 pg (ref 26.0–34.0)
MCHC: 32.2 g/dL (ref 30.0–36.0)
MCV: 95.1 fL (ref 78.0–100.0)
Monocytes Absolute: 0.8 10*3/uL (ref 0.1–1.0)
Monocytes Relative: 12 %
NEUTROS PCT: 71 %
Neutro Abs: 5 10*3/uL (ref 1.7–7.7)
PLATELETS: 271 10*3/uL (ref 150–400)
RBC: 3.85 MIL/uL — AB (ref 3.87–5.11)
RDW: 19.6 % — ABNORMAL HIGH (ref 11.5–15.5)
WBC: 7 10*3/uL (ref 4.0–10.5)

## 2016-09-30 MED ORDER — HYDROCODONE-ACETAMINOPHEN 5-325 MG PO TABS: 1.0000 | ORAL_TABLET | Freq: Four times a day (QID) | ORAL | 0 refills | Status: DC | PRN

## 2016-09-30 NOTE — Progress Notes (Signed)
No aranesp given per protocol. Hemoglobin 11.8.

## 2016-09-30 NOTE — Patient Instructions (Addendum)
[place] at [facility] Discharge Instructions  RECOMMENDATIONS MADE BY THE CONSULTANT AND ANY TEST RESULTS WILL BE SENT TO YOUR REFERRING PHYSICIAN.  You were seen today by Dr. Irene Limbo Follow up in 1 week with Dr. Talbert Cage See Amy up front for appointments   Thank you for choosing [place] at [facility] to provide your oncology and hematology care.  To afford each patient quality time with our provider, please arrive at least 15 minutes before your scheduled appointment time.    If you have a lab appointment with the [facility] please come in thru the  Main Entrance and check in at the main information desk  You need to re-schedule your appointment should you arrive 10 or more minutes late.  We strive to give you quality time with our providers, and arriving late affects you and other patients whose appointments are after yours.  Also, if you no show three or more times for appointments you may be dismissed from the clinic at the providers discretion.     Again, thank you for choosing [facility].  Our hope is that these requests will decrease the amount of time that you wait before being seen by our physicians.       _____________________________________________________________  Should you have questions after your visit to [facility], please contact our office at [phone] between the hours of 8:30 a.m. and 4:30 p.m.  Voicemails left after 4:30 p.m. will not be returned until the following business day.  For prescription refill requests, have your pharmacy contact our office.       Resources For Cancer Patients and their Caregivers ? American Cancer Society: Can assist with transportation, wigs, general needs, runs Look Good Feel Better.        959-866-6475 ? Cancer Care: Provides financial assistance, online support groups, medication/co-pay assistance.  1-800-813-HOPE (281)783-8927) ? [place] Assists [place] cancer patients and their families through emotional , educational and financial support.  8317551927 ? [place] DSS Where to apply for food stamps, Medicaid and utility assistance. 712-734-4511 ? RC[employer]: Transportation to medical appointments. 254-329-8061 ? Social Security Administration: May apply for disability if have a Stage IV cancer. [phone])498-9516 [phone] ? [employer], Disability and Transit Services: Assists with nutrition, care and transit needs. [place] Support Programs: @10RELATIVEDAYS @ > Cancer Support Group  2nd Tuesday of the month 1pm-2pm, Journey Room  > Creative Journey  3rd Tuesday of the month 1130am-1pm, Journey Room  > Look Good Feel Better  1st Wednesday of the month 10am-12 noon, Journey Room (Call [place] to register [phone])

## 2016-09-30 NOTE — Progress Notes (Signed)
Nutrition Follow-up:   ASSESSMENT: Stage 4 breast cancer patient.   Pt states that her appetite is "75%". However, she still sounds to have minimal PO intake.   She is still "going to bed late and getting up late". Her first meal is around noon. Her second sounds to be more like a late night snack. She gave examples of a piece of toast with strawberry preserves or an Ensure.   She has no appetite. She admits to poor compliance with her megace and has not been taking it consistently. She states that when she did take it regularly, she thought it helped. It has been hard for her to take due to poor palatability. RD again highly encouraged her to use this. Recommended placing it in a food to mask the poor taste, such as icecream.   Other than her poor appetite, she endorses SOB and fatigue   She has been consuming Ensure. She does not seem to like the taste too much. She typically consumes it in a float with her Pepsi  One positive change he had made in the interim was switching to whole milk.   Medications: Megace, zofran,   Labs: Reviewed  Anthropometrics:   Height: 5\' 1"  Weight: 93 lbs 2 oz (+1.5 lbs x 1 month) UBW: n/a BMI: 17.6  Estimated Energy Needs Kcals: 1500-1700 kcals (35-40 kcal/kg bw) Protein: 55-65 g Pro (1.3-1.5g/kg bw)  Fluid: 1.5-1.7 L fluid  NUTRITION DIAGNOSIS: Inadequate oral intake related to lack of appetite AEB eating only 1-2 meals/day  MALNUTRITION DIAGNOSIS: Severe malnutrition, chronic  INTERVENTION: Order Patient another case of Ensure. Highly advocated for use of her Megace Recommended ways to increase PO intake via "mindless eating" Educated on most appropriate snacks  MONITORING, EVALUATION, GOAL: Monitor weight.   NEXT VISIT:  As clinically warranted  Burtis Junes RD, LDN, CNSC Clinical Nutrition Pager: [phone] 09/30/2016 1:57 PM

## 2016-10-01 LAB — CANCER ANTIGEN 27.29: CA 27.29: 414.5 U/mL — ABNORMAL HIGH (ref 0.0–38.6)

## 2016-10-01 LAB — CANCER ANTIGEN 15-3: CA 15-3: 332.4 U/mL — ABNORMAL HIGH (ref 0.0–25.0)

## 2016-10-01 NOTE — Progress Notes (Signed)
Debbie Reyes  HEMATOLOGY ONCOLOGY PROGRESS NOTE  Date of service: .09/30/2016  Patient Care Team: Zella Richer. Scotty Court, MD as PCP - General (Unknown Physician Specialty)  CC : Follow-up for metastatic hormone positive HER-2 negative breast cancer status post multiple lines of treatment.   SUMMARY OF ONCOLOGIC HISTORY:   Metastatic breast cancer ([place])   06/07/2005 Initial Biopsy    Biopsy L axillary nodal mass, no breast primary identified      08/11/2005 -  Chemotherapy    AC x 4, with 20% dose reduction on last cycle      11/21/2005 - 12/30/2005 Radiation Therapy    XRT 5040 cGY to L breast and L axillae with Dr. Isidore Moos      11/21/2005 - 12/31/2010 Anti-estrogen oral therapy    Arimidex X 5 years      01/20/2011 PET scan    Widespread hypermetabolic osseous metastatic disease. Index lesions are given above. No evidence of hypermetabolic metastatic disease involving the neck, chest, abdomen or pelvis.       01/25/2011 -  Radiation Therapy    1200 cGy to the left rib and 2050 cGy in 20 fractions to the T5-T10 vertebral bodies with a boost of 675 cGy in 3 fractions           01/25/2011 Treatment Plan Change    Faslodex initiated approximately on this date then stopped. Stopped due to failure to respond. Faslodex given in a dose of 250 mg only 3 doses (July 17, July 31, August 14) and a single 500 mg dose March 24, 2011          04/25/2011 - 10/13/2011 Chemotherapy    Taxol chemotherapy given weekly 2 with one week off. Stopped due to progression          10/22/2011 - 05/11/2012 Anti-estrogen oral therapy    Aromasin and everolimus initiating but stopped due to progression          11/11/2011 Initial Biopsy    L3 vertebral body biopsy ER+PR+      11/11/2011 Procedure    Status post vertebral body augmentation for painful pathologic compression fractures at T11 and at L1 using the balloon kyphoplasty technique. Status post vertebral body augmentation for painful pathologic  compression fracture at T8 using the vertebroplasty technique.      12/09/2012 - 06/10/2013 Anti-estrogen oral therapy    Fareston      04/22/2013 PET scan    Hypermetabolic left upper lobe nodule, possibly metastatic. The possibility of primary bronchogenic carcinoma is also considered. Residual hypermetabolic metastatic disease involving the liver and bones. Small left pleural effusion, stable or minimally increased from 03/07/2013.      06/11/2013 - 01/08/2014 Chemotherapy    Abraxane      01/31/2014 PET scan    Progressive metastatic lymphadenopathy within the right supraclavicular region and mediastinum.New hypermetabolic left adrenal metastasis. Mixed response of hypermetabolic liver metastases. Mild decrease in hypermetabolic activity associated with 1 cm left upper lobe pulmonary nodule. Diffuse bone metastases, with several in the thorax showing increased hypermetabolic activity consistent with mild progression of bone metastases.      02/01/2014 - 05/06/2015 Chemotherapy    XELODA 750 mg/m2 7 on/7 off       06/11/2014 PET scan    Partial metabolic response. Improving mediastinal lymphadenopathy, as above. Improving left adrenal metastasis. Prior hepatic metastasis is no longer evident.Improving multifocal osseous metastases, as above      05/04/2015 PET scan    Dominant finding is new widespread  skeletal metastasis involvingthe axillary and appendicular skeleton. New lesions are not discretely seen on the CT but recognized as new metabolic activity. New LEFT adrenal gland hypermetabolic metastasis. Foci of metabolic activity in the LEFT lung are felt to relate to atelectasis and/or infection. No clear evidence of pulmonary metastasis Moderate pleural LEFT pleural which could be sampled for cytology if clinically relevant.LEFT adrenal metastasis is actually an enlarged LEFT periaortic lymph node. This lymph node was enlarged and hypermetabolic to a greater degree FDG PET 01/31/2014  and subsequently improved and now has recurred on current exam (05/04/2015) as a hypermetabolic enlarged LEFT periaortic lymph node.      05/04/2015 Progression    PET with progression of disease      05/06/2015 Treatment Plan Change    Megace      08/14/2015 PET scan    Suspect new hypermetabolic metastasis in posterior right hepatic lobe. Consider abdomen MRI without and with contrast for further evaluation. No significant change in probable malignant left pleural effusion and left lung atelectasis.No significant change in diffuse osseous metastatic disease. Stable hypermetabolic left abdominal paraaortic lymph node, consistent with metastatic disease      09/09/2015 - 03/07/2016 Chemotherapy    IBRANCE/FASLODEX . Ibrance at 100 mg secondary to grade 3 neutropenia      03/03/2016 PET scan    Interval overall progression of disease. Interval development of new and progression of pre-existing lesions in the liver, hypermetabolic on PET imaging, consistent with metastatic disease. Persistent hypermetabolic patchy opacity in the anterior left upper lobe with persistent chronic left pleural effusion. Diffuse bony metastases again noted with interval progression ofFDG uptake associated with the index bone lesions.      03/03/2016 Progression    PET demonstrates progression of disease.      03/11/2016 -  Chemotherapy    Halaven D1, 8 every 21 days and with Neupogen support.       06/27/2016 PET scan    1. Overall mild improvement of multifocal metastatic disease. 2. Improvement in metabolic nodularity at the LEFT lung base. 3. Improvement in hepatic metastasis. 4. Overall improvement skeletal metastasis. Multiple foci of intense hypermetabolic activity remain within the skeleton remain.      09/27/2016 PET scan    Overall, mild progression is favored.  Status post right mastectomy with reconstruction.  Pulmonary metastases in the lingula, stable versus mildly increased. Associated  central right lower lobe/ infrahilar nodule, grossly unchanged.  New right hilar nodal metastasis.  Moderate loculated left pleural effusion. Associated left lower lobe atelectasis.  Multifocal hepatic metastases, more conspicuous than on the prior, stable versus mildly increased.  Multifocal osseous metastases throughout the visualized axial and appendicular skeleton, grossly unchanged.       INTERVAL HISTORY:  Patient is here for follow-up regarding her hormone positive HER-2 negative breast cancer and to discuss the results of her PET/CT scan done on 09/27/2016. She currently has been on Erbulin for palliative chemotherapy. She has tolerated this reasonably well and has been on time with most of her doses except for one missed dose in January 2018 .Has also been prescribed Megace to help her appetite and possibly as a hormone modulating agent. Notes that she hasn't been taking this regularly. She is also receiving Aranesp on an as-needed basis. Patient notes some fatigue and notes that she has had a bad bronchitis requiring 2 rounds of Z-Pak and then levofloxacin which she finished at the end of February.  The PET CT scan was discussed in detail  with her. She notes that the read that suggests right breast mastectomy with reconstruction is not correct in that the PET findings reflect the fact that she had breast surgery for a benign cyst in her younger years and needed reconstruction for cosmetic results at the time.   REVIEW OF SYSTEMS:    10 Point review of systems of done and is negative except as noted above.  . Past Medical History:  Diagnosis Date  . Bone metastases ([place]) 12/29/2015  . Cancer ([place])   . Low serum vitamin B12 03/18/2016  . Metastatic breast cancer ([place]) 12/29/2015    .History reviewed. No pertinent surgical history.  . Social History  Substance Use Topics  . Smoking status: Never Smoker  . Smokeless tobacco: Never Used  . Alcohol use No    ALLERGIES:   has No Known Allergies.  MEDICATIONS:  Current Outpatient Prescriptions  Medication Sig Dispense Refill  . albuterol (PROVENTIL HFA;VENTOLIN HFA) 108 (90 Base) MCG/ACT inhaler Inhale 2 puffs into the lungs 2 (two) times daily. 1 Inhaler 0  . aspirin EC 81 MG tablet Take 81 mg by mouth daily.    Debbie Reyes denosumab (XGEVA) 120 MG/1.7ML SOLN Inject 120 mg into the skin once. Pt took on 10/13/11    . EriBULin Mesylate (HALAVEN IV) Inject into the vein. Day 1 day 8, every 21 days    . HYDROcodone-acetaminophen (NORCO/VICODIN) 5-325 MG tablet Take 1 tablet by mouth every 6 (six) hours as needed. For pain 60 tablet 0  . lidocaine-prilocaine (EMLA) cream Apply topically as needed. 1 each 2  . LORazepam (ATIVAN) 1 MG tablet Take 1 mg by mouth at bedtime as needed. For sleep    . megestrol (MEGACE) 400 MG/10ML suspension Take 10 mLs (400 mg total) by mouth daily. 240 mL 1  . Melatonin 5 MG TABS Take by mouth at bedtime as needed.    . ondansetron (ZOFRAN) 8 MG tablet Take 1 tablet (8 mg total) by mouth 2 (two) times daily as needed (Nausea or vomiting). 30 tablet 1  . PARoxetine (PAXIL) 20 MG tablet Take 1 tablet (20 mg total) by mouth daily. 30 tablet 2  . potassium chloride SA (K-DUR,KLOR-CON) 20 MEQ tablet Take 0.5 tablets (10 mEq total) by mouth 2 (two) times daily. 60 tablet 0  . predniSONE (DELTASONE) 10 MG tablet Take 2 tablets daily for 4 days, then 1 tablet daily for 4 days 12 tablet 0  . prochlorperazine (COMPAZINE) 10 MG tablet Take 1 tablet (10 mg total) by mouth every 6 (six) hours as needed (Nausea or vomiting). 30 tablet 1  . propranolol (INDERAL) 10 MG tablet Take 1 tablet (10 mg total) by mouth 2 (two) times daily. 60 tablet 3  . Spacer/Aero-Holding Chambers (AEROCHAMBER MV) inhaler Use as instructed 1 each 0   No current facility-administered medications for this visit.    Facility-Administered Medications Ordered in Other Visits  Medication Dose Route Frequency Provider Last Rate Last Dose    . fludeoxyglucose F - 18 (FDG) injection 5 millicurie  5 millicurie Intravenous Once PRN Genia Del, MD        PHYSICAL EXAMINATION: ECOG PERFORMANCE STATUS: 2 - Symptomatic, <50% confined to bed  . Vitals:   09/30/16 1118  BP: 127/79  Pulse: (!) 106  Resp: 18    Filed Weights   09/30/16 1118  Weight: 93 lb 1.6 oz (42.2 kg)   .Body mass index is 17.59 kg/m.  GENERAL:alert, in no acute distress and comfortable SKIN: no  acute rashes, no significant lesions EYES: conjunctiva are pink and non-injected, sclera anicteric OROPHARYNX: MMM, no exudates, no oropharyngeal erythema or ulceration NECK: supple, no JVD LYMPH:  no palpable lymphadenopathy in the cervical, axillary or inguinal regions LUNGS: clear to auscultation b/l with normal respiratory effort HEART: regular rate & rhythm ABDOMEN:  normoactive bowel sounds , non tender, not distended. Extremity: no pedal edema PSYCH: alert & oriented x 3 with fluent speech NEURO: no focal motor/sensory deficits  LABORATORY DATA:   I have reviewed the data as listed  . CBC Latest Ref Rng & Units 09/30/2016 09/09/2016 09/02/2016  WBC 4.0 - 10.5 K/uL 7.0 9.3 10.6(H)  Hemoglobin 12.0 - 15.0 g/dL 11.8(L) 11.3(L) 11.6(L)  Hematocrit 36.0 - 46.0 % 36.6 35.9(L) 37.2  Platelets 150 - 400 K/uL 271 272 255    . CMP Latest Ref Rng & Units 09/30/2016 09/09/2016 09/02/2016  Glucose 65 - 99 mg/dL 100(H) 116(H) 107(H)  BUN 6 - 20 mg/dL 25(H) 14 17  Creatinine 0.44 - 1.00 mg/dL 0.89 0.84 0.87  Sodium 135 - 145 mmol/L 136 135 137  Potassium 3.5 - 5.1 mmol/L 3.9 3.9 4.0  Chloride 101 - 111 mmol/L 102 99(L) 103  CO2 22 - 32 mmol/L _0 Calcium 8.9 - 10.3 mg/dL 9.6 9.3 9.3  Total Protein 6.5 - 8.1 g/dL 7.5 6.8 6.8  Total Bilirubin 0.3 - 1.2 mg/dL 0.3 0.6 0.6  Alkaline Phos 38 - 126 U/L 70 67 59  AST 15 - 41 U/L 30 37 31  ALT 14 - 54 U/L 15 13(L) 11(L)     RADIOGRAPHIC STUDIES: I have personally reviewed the radiological images as  listed and agreed with the findings in the report. Nm Pet Image Restag (ps) Skull Base To Thigh  Result Date: 09/27/2016 CLINICAL DATA:  Subsequent treatment strategy for metastatic breast cancer. EXAM: NUCLEAR MEDICINE PET SKULL BASE TO THIGH TECHNIQUE: 5.0 mCi F-18 FDG was injected intravenously. Full-ring PET imaging was performed from the skull base to thigh after the radiotracer. CT data was obtained and used for attenuation correction and anatomic localization. FASTING BLOOD GLUCOSE:  Value: 89 mg/dl COMPARISON:  PET-CT dated 06/27/2016 FINDINGS: NECK No hypermetabolic lymph nodes in the neck. CHEST Status post right mastectomy with reconstruction. Right hilar node, max SUV 6.2, new. Two lingular nodules, max SUV 5.8, previously 4.2. Central right lower lobe/ infrahilar nodule/node, max SUV 5.2, previously 5.7. Moderate loculated left pleural effusion. Patchy left lower lobe opacity, likely atelectasis. ABDOMEN/PELVIS Up to 3 hypermetabolic lesions in the liver. Lesion at the junction of segment 8 and 4A, max SUV 4.2, previously 3.7. Additional lesion in segment 2, max SUV 3.9, not well visualized on the prior. Additional lesion in segment 6, max SUV 3.1, not well visualized on the prior. No abnormal hypermetabolic activity within the pancreas, adrenal glands, or spleen. No hypermetabolic lymph nodes in the abdomen or pelvis. SKELETON Widespread/multifocal osseous metastases throughout the visualized axial and appendicular skeleton. Overall, this appearance is grossly unchanged.  For example: --Proximal right femur, max SUV 7.2, previously 4.0 --T4 vertebral body, max SUV 8.0, previously 9.8 --Right sternum, max SUV 9.0, previously 6.7 --Right inferior scapula, max SUV 4.6, previously 4.4 --L1 vertebral body, max SUV 5.2, previously 5.4 --Left posterior iliac bone, max SUV 12.4, previously 9.9 --Posterior column of the right acetabulum, max SUV 6.6, previously 7.2 --Right proximal femoral metaphysis, max  SUV 5.1, previously 4.9 IMPRESSION: Overall, mild progression is favored. Status post right mastectomy with reconstruction. Pulmonary  metastases in the lingula, stable versus mildly increased. Associated central right lower lobe/ infrahilar nodule, grossly unchanged. New right hilar nodal metastasis. Moderate loculated left pleural effusion. Associated left lower lobe atelectasis. Multifocal hepatic metastases, more conspicuous than on the prior, stable versus mildly increased. Multifocal osseous metastases throughout the visualized axial and appendicular skeleton, grossly unchanged. Electronically Signed   By: Julian Hy M.D.   On: 09/27/2016 15:17    ASSESSMENT & PLAN:   76 year old female with  1) Stage IV metastatic breast cancer with bone mets; ER+/PR+/HER2- AND B12 deficiency  CURRENT THERAPY: Halaven Days 1 & 8 every 21 days (with G-CSF support) and Xgeva AND B12 monthly   Patient has been through several lines of hormonal and chemotherapy.  PET CT scan done on 09/27/2016 shows mild progression. This progression is consistent with her tumor marker levels that also shows progressive increase.      PLAN -The lab findings and PET/CT scan were discussed in details with the patient. -A PET CT scan and tumor marker results are consistent with disease progression on Eribulin -We went over the fact that she has gone through multiple lines of hormonal therapy and chemotherapy. -Discussed goals of care in details. She does not seem to have any significant uncontrolled symptoms at this time. -She was seen by the dietitian to define appropriate dietary interventions. -Has been on Megace but has not been taking it regularly notes that she will try to do this. -We discussed that there could be options for additional palliative chemotherapy which probably will not work for significant periods of time due to diminishing returns from single agent chemotherapy especially in light of  significant pretreatment and her age and risk of significant limiting cytopenias . Based on available information it does not appear that the patient has been on gemcitabine or ixabepilone and these could be palliative chemotherapy options. Not sure if she has had carboplatin and this might be difficult to tolerate in the setting of her age and extensive chemotherapy pretreatments. -Offered her consultation with Dr. Verne Spurr at [facility] for second opinion and consideration of clinical trial options if any. -Patient notes that she would like to talk to her friend and previous oncologist Dr Sonny Dandy prior to any further discussions and decisions. - Will follow my note doctors out to follow-up with Dr. Verdie Mosher if possible to help define further treatment options . -The patient is aware that best supportive care is always an option .  I available informatio spent 30 minutes counseling the patient face to face. The total time spent in the appointment was 40 minutes and more than 50% was on counseling and direct patient cares.    Sullivan Lone MD [place] AAHIVMS [facility] Hematology/Oncology Physician [facility]  (Office):       [phone] (Work cell):  [phone] (Fax):           [phone]

## 2016-10-04 ENCOUNTER — Encounter (HOSPITAL_BASED_OUTPATIENT_CLINIC_OR_DEPARTMENT_OTHER): Payer: Medicare Other | Admitting: Oncology

## 2016-10-04 VITALS — BP 131/76 | HR 114 | Temp 97.6°F | Resp 22 | Wt 94.3 lb

## 2016-10-04 DIAGNOSIS — D61810 Antineoplastic chemotherapy induced pancytopenia: Secondary | ICD-10-CM | POA: Diagnosis not present

## 2016-10-04 DIAGNOSIS — Z170 Estrogen receptor positive status [ER+]: Secondary | ICD-10-CM | POA: Diagnosis not present

## 2016-10-04 DIAGNOSIS — E538 Deficiency of other specified B group vitamins: Secondary | ICD-10-CM | POA: Diagnosis not present

## 2016-10-04 DIAGNOSIS — C50919 Malignant neoplasm of unspecified site of unspecified female breast: Secondary | ICD-10-CM

## 2016-10-04 DIAGNOSIS — C7951 Secondary malignant neoplasm of bone: Secondary | ICD-10-CM | POA: Diagnosis not present

## 2016-10-04 DIAGNOSIS — C7801 Secondary malignant neoplasm of right lung: Secondary | ICD-10-CM

## 2016-10-04 DIAGNOSIS — C787 Secondary malignant neoplasm of liver and intrahepatic bile duct: Secondary | ICD-10-CM

## 2016-10-04 DIAGNOSIS — C50612 Malignant neoplasm of axillary tail of left female breast: Secondary | ICD-10-CM | POA: Diagnosis not present

## 2016-10-04 NOTE — Patient Instructions (Signed)
[place] at [facility] Discharge Instructions  RECOMMENDATIONS MADE BY THE CONSULTANT AND ANY TEST RESULTS WILL BE SENT TO YOUR REFERRING PHYSICIAN.  You were seen today by Dr. Twana First Follow up in 2 weeks with Gershon Mussel See Amy up front for appointments   Thank you for choosing [place] at [facility] to provide your oncology and hematology care.  To afford each patient quality time with our provider, please arrive at least 15 minutes before your scheduled appointment time.    If you have a lab appointment with the [place] please come in thru the  Main Entrance and check in at the main information desk  You need to re-schedule your appointment should you arrive 10 or more minutes late.  We strive to give you quality time with our providers, and arriving late affects you and other patients whose appointments are after yours.  Also, if you no show three or more times for appointments you may be dismissed from the clinic at the providers discretion.     Again, thank you for choosing [facility].  Our hope is that these requests will decrease the amount of time that you wait before being seen by our physicians.       _____________________________________________________________  Should you have questions after your visit to [facility], please contact our office at [phone] between the hours of 8:30 a.m. and 4:30 p.m.  Voicemails left after 4:30 p.m. will not be returned until the following business day.  For prescription refill requests, have your pharmacy contact our office.       Resources For Cancer Patients and their Caregivers ? American Cancer Society: Can assist with transportation, wigs, general needs, runs Look Good Feel Better.        240-168-9085 ? Cancer Care: Provides financial assistance, online support groups, medication/co-pay assistance.  1-800-813-HOPE 313-510-3630) ? [place] Assists [place] cancer patients and their families through emotional , educational and financial support.  863-144-4905 ? [place] DSS Where to apply for food stamps, Medicaid and utility assistance. 336-095-1981 ? [facility]: Transportation to medical appointments. 917-458-5288 ? Social Security Administration: May apply for disability if have a Stage IV cancer. (541)172-9977 623-016-9154 ? [employer], Disability and Transit Services: Assists with nutrition, care and transit needs. [place] Support Programs: @10RELATIVEDAYS @ > Cancer Support Group  2nd Tuesday of the month 1pm-2pm, Journey Room  > Creative Journey  3rd Tuesday of the month 1130am-1pm, Journey Room  > Look Good Feel Better  1st Wednesday of the month 10am-12 noon, Journey Room (Call [place] to register [phone])

## 2016-10-04 NOTE — Progress Notes (Signed)
[name]    HEMATOLOGY/ONCOLOGY PROGRESS NOTE  Date of Service: 03/16/2016  Patient Care Team: Zella Richer. Scotty Court, MD as PCP - General (Unknown Physician Specialty)  CHIEF COMPLAINTS:  Continued management of metastatic breast cancer    Metastatic breast cancer ([place])   06/07/2005 Initial Biopsy    Biopsy L axillary nodal mass, no breast primary identified      08/11/2005 -  Chemotherapy    AC x 4, with 20% dose reduction on last cycle      11/21/2005 - 12/30/2005 Radiation Therapy    XRT 5040 cGY to L breast and L axillae with Dr. Isidore Moos      11/21/2005 - 12/31/2010 Anti-estrogen oral therapy    Arimidex X 5 years      01/20/2011 PET scan    Widespread hypermetabolic osseous metastatic disease. Index lesions are given above. No evidence of hypermetabolic metastatic disease involving the neck, chest, abdomen or pelvis.       01/25/2011 -  Radiation Therapy    1200 cGy to the left rib and 2050 cGy in 20 fractions to the T5-T10 vertebral bodies with a boost of 675 cGy in 3 fractions           01/25/2011 Treatment Plan Change    Faslodex initiated approximately on this date then stopped. Stopped due to failure to respond. Faslodex given in a dose of 250 mg only 3 doses (July 17, July 31, August 14) and a single 500 mg dose March 24, 2011          04/25/2011 - 10/13/2011 Chemotherapy    Taxol chemotherapy given weekly 2 with one week off. Stopped due to progression          10/22/2011 - 05/11/2012 Anti-estrogen oral therapy    Aromasin and everolimus initiating but stopped due to progression          11/11/2011 Initial Biopsy    L3 vertebral body biopsy ER+PR+      11/11/2011 Procedure    Status post vertebral body augmentation for painful pathologic compression fractures at T11 and at L1 using the balloon kyphoplasty technique. Status post vertebral body augmentation for painful pathologic compression fracture at T8 using the vertebroplasty technique.      12/09/2012 - 06/10/2013 Anti-estrogen oral therapy    Fareston      04/22/2013 PET scan    Hypermetabolic left upper lobe nodule, possibly metastatic. The possibility of primary bronchogenic carcinoma is also considered. Residual hypermetabolic metastatic disease involving the liver and bones. Small left pleural effusion, stable or minimally increased from 03/07/2013.      06/11/2013 - 01/08/2014 Chemotherapy    Abraxane      01/31/2014 PET scan    Progressive metastatic lymphadenopathy within the right supraclavicular region and mediastinum.New hypermetabolic left adrenal metastasis. Mixed response of hypermetabolic liver metastases. Mild decrease in hypermetabolic activity associated with 1 cm left upper lobe pulmonary nodule. Diffuse bone metastases, with several in the thorax showing increased hypermetabolic activity consistent with mild progression of bone metastases.      02/01/2014 - 05/06/2015 Chemotherapy    XELODA 750 mg/m2 7 on/7 off       06/11/2014 PET scan    Partial metabolic response. Improving mediastinal lymphadenopathy, as above. Improving left adrenal metastasis. Prior hepatic metastasis is no longer evident.Improving multifocal osseous metastases, as above      05/04/2015 PET scan    Dominant finding is new widespread skeletal metastasis involvingthe axillary and appendicular skeleton. New lesions are not discretely seen  on the CT but recognized as new metabolic activity. New LEFT adrenal gland hypermetabolic metastasis. Foci of metabolic activity in the LEFT lung are felt to relate to atelectasis and/or infection. No clear evidence of pulmonary metastasis Moderate pleural LEFT pleural which could be sampled for cytology if clinically relevant.LEFT adrenal metastasis is actually an enlarged LEFT periaortic lymph node. This lymph node was enlarged and hypermetabolic to a greater degree FDG PET 01/31/2014 and subsequently improved and now has recurred on current exam  (05/04/2015) as a hypermetabolic enlarged LEFT periaortic lymph node.      05/04/2015 Progression    PET with progression of disease      05/06/2015 Treatment Plan Change    Megace      08/14/2015 PET scan    Suspect new hypermetabolic metastasis in posterior right hepatic lobe. Consider abdomen MRI without and with contrast for further evaluation. No significant change in probable malignant left pleural effusion and left lung atelectasis.No significant change in diffuse osseous metastatic disease. Stable hypermetabolic left abdominal paraaortic lymph node, consistent with metastatic disease      09/09/2015 - 03/07/2016 Chemotherapy    IBRANCE/FASLODEX . Ibrance at 100 mg secondary to grade 3 neutropenia      03/03/2016 PET scan    Interval overall progression of disease. Interval development of new and progression of pre-existing lesions in the liver, hypermetabolic on PET imaging, consistent with metastatic disease. Persistent hypermetabolic patchy opacity in the anterior left upper lobe with persistent chronic left pleural effusion. Diffuse bony metastases again noted with interval progression ofFDG uptake associated with the index bone lesions.      03/03/2016 Progression    PET demonstrates progression of disease.      03/11/2016 -  Chemotherapy    Halaven D1, 8 every 21 days and with Neupogen support.       06/27/2016 PET scan    1. Overall mild improvement of multifocal metastatic disease. 2. Improvement in metabolic nodularity at the LEFT lung base. 3. Improvement in hepatic metastasis. 4. Overall improvement skeletal metastasis. Multiple foci of intense hypermetabolic activity remain within the skeleton remain.      09/27/2016 PET scan    Overall, mild progression is favored.  Status post right mastectomy with reconstruction.  Pulmonary metastases in the lingula, stable versus mildly increased. Associated central right lower lobe/ infrahilar nodule,  grossly unchanged.  New right hilar nodal metastasis.  Moderate loculated left pleural effusion. Associated left lower lobe atelectasis.  Multifocal hepatic metastases, more conspicuous than on the prior, stable versus mildly increased.  Multifocal osseous metastases throughout the visualized axial and appendicular skeleton, grossly unchanged.      HISTORY OF PRESENTING ILLNESS:  Debbie Reyes is a wonderful 76 y.o. female who returns for continued management of metastatic breast cancer.  She is doing well today. She reports no new complaints since she was last seen here last week. The only bone pain she has is in her back. Denies abdominal pain, SOB, chest pain, or any other concerns. In the interim, she has gotten in touch with [facility] through the assistance of Dr. Sonny Dandy to see Dr. Arabella Merles for a second opinion, next Tuesday, 10/11/16.  MEDICAL HISTORY:  Past Medical History:  Diagnosis Date  . Bone metastases ([place]) 12/29/2015  . Cancer ([place])   . Low serum vitamin B12 03/18/2016  . Metastatic breast cancer ([place]) 12/29/2015  Depression   anxiety   SURGICAL HISTORY: L1 bone metastases biopsy  Axillary lymph node biopsy   SOCIAL  HISTORY: Social History   Social History  . Marital status: Widowed    Spouse name: N/A  . Number of children: N/A  . Years of education: N/A   Occupational History  . Not on file.   Social History Main Topics  . Smoking status: Never Smoker  . Smokeless tobacco: Never Used  . Alcohol use No  . Drug use: No  . Sexual activity: No   Other Topics Concern  . Not on file   Social History Narrative  . No narrative on file    FAMILY HISTORY: Patient denies family history of breast cancer, ovarian cancer or uterine cancer . Denies any family history of blood clotting or bleeding disorders   ALLERGIES:  has No Known Allergies.  MEDICATIONS:  Current Outpatient Prescriptions  Medication Sig Dispense Refill  . albuterol (PROVENTIL  HFA;VENTOLIN HFA) 108 (90 Base) MCG/ACT inhaler Inhale 2 puffs into the lungs 2 (two) times daily. 1 Inhaler 0  . aspirin EC 81 MG tablet Take 81 mg by mouth daily.    [name] denosumab (XGEVA) 120 MG/1.7ML SOLN Inject 120 mg into the skin once. Pt took on 10/13/11    . EriBULin Mesylate (HALAVEN IV) Inject into the vein. Day 1 day 8, every 21 days    . HYDROcodone-acetaminophen (NORCO/VICODIN) 5-325 MG tablet Take 1 tablet by mouth every 6 (six) hours as needed. For pain 60 tablet 0  . lidocaine-prilocaine (EMLA) cream Apply topically as needed. 1 each 2  . LORazepam (ATIVAN) 1 MG tablet Take 1 mg by mouth at bedtime as needed. For sleep    . megestrol (MEGACE) 400 MG/10ML suspension Take 10 mLs (400 mg total) by mouth daily. 240 mL 1  . Melatonin 5 MG TABS Take by mouth at bedtime as needed.    . ondansetron (ZOFRAN) 8 MG tablet Take 1 tablet (8 mg total) by mouth 2 (two) times daily as needed (Nausea or vomiting). 30 tablet 1  . PARoxetine (PAXIL) 20 MG tablet Take 1 tablet (20 mg total) by mouth daily. 30 tablet 2  . potassium chloride SA (K-DUR,KLOR-CON) 20 MEQ tablet Take 0.5 tablets (10 mEq total) by mouth 2 (two) times daily. 60 tablet 0  . predniSONE (DELTASONE) 10 MG tablet Take 2 tablets daily for 4 days, then 1 tablet daily for 4 days 12 tablet 0  . prochlorperazine (COMPAZINE) 10 MG tablet Take 1 tablet (10 mg total) by mouth every 6 (six) hours as needed (Nausea or vomiting). 30 tablet 1  . propranolol (INDERAL) 10 MG tablet Take 1 tablet (10 mg total) by mouth 2 (two) times daily. 60 tablet 3  . Spacer/Aero-Holding Chambers (AEROCHAMBER MV) inhaler Use as instructed 1 each 0   No current facility-administered medications for this visit.     REVIEW OF SYSTEMS:   Review of Systems  Constitutional: Positive for malaise/fatigue.  HENT: Negative.   Eyes: Negative.   Respiratory: Negative.  Negative for shortness of breath.   Cardiovascular: Negative.  Negative for chest pain.   Gastrointestinal: Negative.  Negative for abdominal pain.  Genitourinary: Negative.   Musculoskeletal: Positive for back pain.  Skin: Negative.   Neurological: Negative.   Endo/Heme/Allergies: Negative.   Psychiatric/Behavioral: Negative.   All other systems reviewed and are negative. 14 point review of systems was performed and is negative except as detailed under history of present illness and above  PHYSICAL EXAMINATION: ECOG PERFORMANCE STATUS: 1 - Symptomatic but completely ambulatory  Vitals:   10/04/16 1014  BP: 131/76  Pulse: (!) 114  Resp: (!) 22  Temp: 97.6 F (36.4 C)    Physical Exam  Constitutional: She is oriented to person, place, and time and well-developed, well-nourished, and in no distress.  HENT:  Head: Normocephalic and atraumatic.  Eyes: EOM are normal. Pupils are equal, round, and reactive to light. No scleral icterus.  Neck: Normal range of motion. Neck supple.  Cardiovascular: Normal rate, regular rhythm and normal heart sounds.   Pulmonary/Chest: Effort normal. She has wheezes.  Abdominal: Soft. Bowel sounds are normal. She exhibits no distension and no mass. There is no tenderness. There is no rebound and no guarding.  Musculoskeletal: Normal range of motion.  Lymphadenopathy:    She has no cervical adenopathy.  Neurological: She is alert and oriented to person, place, and time. Gait normal.  Skin: Skin is warm and dry.  Psychiatric: Mood, memory, affect and judgment normal.  Nursing note and vitals reviewed.  LABORATORY DATA:  I have reviewed the data as listed  CBC Latest Ref Rng & Units 09/30/2016 09/09/2016 09/02/2016  WBC 4.0 - 10.5 K/uL 7.0 9.3 10.6(H)  Hemoglobin 12.0 - 15.0 g/dL 11.8(L) 11.3(L) 11.6(L)  Hematocrit 36.0 - 46.0 % 36.6 35.9(L) 37.2  Platelets 150 - 400 K/uL 271 272 255   CMP Latest Ref Rng & Units 09/30/2016 09/09/2016 09/02/2016  Glucose 65 - 99 mg/dL 100(H) 116(H) 107(H)  BUN 6 - 20 mg/dL 25(H) 14 17  Creatinine 0.44 -  1.00 mg/dL 0.89 0.84 0.87  Sodium 135 - 145 mmol/L 136 135 137  Potassium 3.5 - 5.1 mmol/L 3.9 3.9 4.0  Chloride 101 - 111 mmol/L 102 99(L) 103  CO2 22 - 32 mmol/L 24 26 23   Calcium 8.9 - 10.3 mg/dL 9.6 9.3 9.3  Total Protein 6.5 - 8.1 g/dL 7.5 6.8 6.8  Total Bilirubin 0.3 - 1.2 mg/dL 0.3 0.6 0.6  Alkaline Phos 38 - 126 U/L 70 67 59  AST 15 - 41 U/L 30 37 31  ALT 14 - 54 U/L 15 13(L) 11(L)   RADIOGRAPHIC STUDIES: I have personally reviewed the radiological images as listed and agreed with the findings in the report.  PET Scan 09/27/2016 IMPRESSION: Overall, mild progression is favored.  Status post right mastectomy with reconstruction.  Pulmonary metastases in the lingula, stable versus mildly increased. Associated central right lower lobe/ infrahilar nodule, grossly unchanged.  New right hilar nodal metastasis.  Moderate loculated left pleural effusion. Associated left lower lobe atelectasis.  Multifocal hepatic metastases, more conspicuous than on the prior, stable versus mildly increased.  Multifocal osseous metastases throughout the visualized axial and appendicular skeleton, grossly unchanged.  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 She has been on multiple agents in the past. Unfortunately she has limited options at this time. May consider single agent chemotherapy such as gemzar, however its doubtful that she will get a good response from single agent chemotherapy.    She is going to see Dr[facility] Arabella [facility]les at [facility] for a second opinion on April 3.   I will fax her records to Dr. Alinda Money.   She will return for follow up in 2 weeks, after speaking to Dr. Alinda Money.    All of the patients questions were answered to her  apparent satisfaction. The patient knows to call the clinic  with any problems, questions or concerns.  This document serves as a record of services personally performed by Twana First, MD. It was created on her behalf by Martinique Casey, a trained medical scribe. The creation of this record is based on the scribe's personal observations and the provider's statements to them. This document has been checked and approved by the attending provider.  I have reviewed the above documentation for accuracy and completeness and I agree with the above.

## 2016-10-11 ENCOUNTER — Encounter (HOSPITAL_COMMUNITY): Payer: Self-pay | Admitting: Oncology

## 2016-10-11 DIAGNOSIS — C50919 Malignant neoplasm of unspecified site of unspecified female breast: Secondary | ICD-10-CM | POA: Diagnosis not present

## 2016-10-11 NOTE — Progress Notes (Signed)
Spoke with Dr. Alinda Money on the phone. He is seeing the patient for a second opinion today. He is recommending that patient be placed on a low dose cyclophosphamide and methotrexate. He will be emailing me the clinical trial later this week. We will get the patient in for follow up and started on this treatment plan. He will continue to see if patient has any available clinical trials, but as of right now she does not qualify for any at his institution.

## 2016-10-13 ENCOUNTER — Ambulatory Visit (HOSPITAL_COMMUNITY): Payer: Medicare Other

## 2016-10-14 ENCOUNTER — Other Ambulatory Visit (HOSPITAL_COMMUNITY): Payer: Self-pay | Admitting: *Deleted

## 2016-10-17 ENCOUNTER — Telehealth (HOSPITAL_COMMUNITY): Payer: Self-pay | Admitting: Oncology

## 2016-10-17 ENCOUNTER — Encounter (HOSPITAL_COMMUNITY): Payer: Medicare Other

## 2016-10-17 ENCOUNTER — Encounter (HOSPITAL_COMMUNITY): Payer: Self-pay | Admitting: Oncology

## 2016-10-17 ENCOUNTER — Encounter (HOSPITAL_BASED_OUTPATIENT_CLINIC_OR_DEPARTMENT_OTHER): Payer: Medicare Other | Admitting: Oncology

## 2016-10-17 ENCOUNTER — Other Ambulatory Visit: Payer: Self-pay

## 2016-10-17 ENCOUNTER — Other Ambulatory Visit (HOSPITAL_COMMUNITY): Payer: Self-pay | Admitting: Oncology

## 2016-10-17 ENCOUNTER — Encounter (HOSPITAL_COMMUNITY): Payer: Medicare Other | Attending: Oncology

## 2016-10-17 ENCOUNTER — Other Ambulatory Visit (HOSPITAL_COMMUNITY): Payer: Medicare Other

## 2016-10-17 VITALS — BP 130/67 | HR 101 | Temp 97.6°F | Resp 16 | Ht 61.0 in | Wt 93.7 lb

## 2016-10-17 DIAGNOSIS — C50919 Malignant neoplasm of unspecified site of unspecified female breast: Secondary | ICD-10-CM | POA: Diagnosis not present

## 2016-10-17 DIAGNOSIS — C7951 Secondary malignant neoplasm of bone: Secondary | ICD-10-CM | POA: Insufficient documentation

## 2016-10-17 DIAGNOSIS — Z170 Estrogen receptor positive status [ER+]: Secondary | ICD-10-CM | POA: Diagnosis not present

## 2016-10-17 DIAGNOSIS — E538 Deficiency of other specified B group vitamins: Secondary | ICD-10-CM

## 2016-10-17 LAB — CBC WITH DIFFERENTIAL/PLATELET
BASOS ABS: 0 10*3/uL (ref 0.0–0.1)
BASOS PCT: 0 %
EOS ABS: 0.2 10*3/uL (ref 0.0–0.7)
Eosinophils Relative: 3 %
HCT: 34.4 % — ABNORMAL LOW (ref 36.0–46.0)
HEMOGLOBIN: 11.2 g/dL — AB (ref 12.0–15.0)
LYMPHS ABS: 1.4 10*3/uL (ref 0.7–4.0)
Lymphocytes Relative: 20 %
MCH: 30.2 pg (ref 26.0–34.0)
MCHC: 32.6 g/dL (ref 30.0–36.0)
MCV: 92.7 fL (ref 78.0–100.0)
Monocytes Absolute: 0.9 10*3/uL (ref 0.1–1.0)
Monocytes Relative: 13 %
NEUTROS PCT: 64 %
Neutro Abs: 4.6 10*3/uL (ref 1.7–7.7)
PLATELETS: 239 10*3/uL (ref 150–400)
RBC: 3.71 MIL/uL — AB (ref 3.87–5.11)
RDW: 18.7 % — ABNORMAL HIGH (ref 11.5–15.5)
WBC: 7.3 10*3/uL (ref 4.0–10.5)

## 2016-10-17 LAB — COMPREHENSIVE METABOLIC PANEL
ALBUMIN: 3.6 g/dL (ref 3.5–5.0)
ALK PHOS: 53 U/L (ref 38–126)
ALT: 11 U/L — AB (ref 14–54)
AST: 31 U/L (ref 15–41)
Anion gap: 11 (ref 5–15)
BUN: 19 mg/dL (ref 6–20)
CALCIUM: 9.5 mg/dL (ref 8.9–10.3)
CHLORIDE: 102 mmol/L (ref 101–111)
CO2: 21 mmol/L — AB (ref 22–32)
CREATININE: 1.01 mg/dL — AB (ref 0.44–1.00)
GFR calc Af Amer: >60 mL/min (ref >60)
GFR calc non Af Amer: 53 mL/min — ABNORMAL LOW (ref >60)
GLUCOSE: 124 mg/dL — AB (ref 65–99)
Potassium: 3.9 mmol/L (ref 3.5–5.1)
SODIUM: 134 mmol/L — AB (ref 135–145)
Total Bilirubin: 0.4 mg/dL (ref 0.3–1.2)
Total Protein: 7 g/dL (ref 6.5–8.1)

## 2016-10-17 MED ORDER — CYCLOPHOSPHAMIDE 50 MG PO CAPS: 50.0000 mg | ORAL_CAPSULE | Freq: Every day | ORAL | 0 refills | Status: DC

## 2016-10-17 MED ORDER — CYCLOPHOSPHAMIDE 50 MG PO TABS: 50.0000 mg | ORAL_TABLET | Freq: Every day | ORAL | 0 refills | Status: DC

## 2016-10-17 MED ORDER — DENOSUMAB 120 MG/1.7ML ~~LOC~~ SOLN
120.0000 mg | Freq: Once | SUBCUTANEOUS | Status: AC
  Administered 2016-10-17: 120 mg via SUBCUTANEOUS
  Filled 2016-10-17: qty 1.7

## 2016-10-17 MED ORDER — HYDROCODONE-ACETAMINOPHEN 5-325 MG PO TABS: 1.0000 | ORAL_TABLET | Freq: Four times a day (QID) | ORAL | 0 refills | Status: DC | PRN

## 2016-10-17 MED ORDER — METHOTREXATE 2.5 MG PO TABS: ORAL_TABLET | ORAL | 0 refills | Status: DC

## 2016-10-17 NOTE — Assessment & Plan Note (Signed)
Bone metastases requiring vertebroplasty and kyphoplasty in past.  On Ca++ and Vit D.  On Xgeva monthly.  Labs today: CMET.  I personally reviewed and went over laboratory results with the patient.  The results are noted within this dictation.  Xgeva today and monthly.

## 2016-10-17 NOTE — Patient Instructions (Addendum)
[place] at [facility] Discharge Instructions  RECOMMENDATIONS MADE BY THE CONSULTANT AND ANY TEST RESULTS WILL BE SENT TO YOUR REFERRING PHYSICIAN.  You were seen today by Kirby Crigler PA-C. Xgeva today. Return in 2 weeks for labs and follow up.    Thank you for choosing [place] at [facility] to provide your oncology and hematology care.  To afford each patient quality time with our provider, please arrive at least 15 minutes before your scheduled appointment time.    If you have a lab appointment with the [place] please come in thru the  Main Entrance and check in at the main information desk  You need to re-schedule your appointment should you arrive 10 or more minutes late.  We strive to give you quality time with our providers, and arriving late affects you and other patients whose appointments are after yours.  Also, if you no show three or more times for appointments you may be dismissed from the clinic at the providers discretion.     Again, thank you for choosing [facility].  Our hope is that these requests will decrease the amount of time that you wait before being seen by our physicians.       _____________________________________________________________  Should you have questions after your visit to [facility], please contact our office at [phone] between the hours of 8:30 a.m. and 4:30 p.m.  Voicemails left after 4:30 p.m. will not be returned until the following business day.  For prescription refill requests, have your pharmacy contact our office.       Resources For Cancer Patients and their Caregivers ? American Cancer Society: Can assist with transportation, wigs, general needs, runs Look Good Feel Better.        (765)670-8560 ? Cancer Care: Provides financial assistance, online support groups, medication/co-pay assistance.  1-800-813-HOPE 312-836-8650) ? [facility] Assists [place] cancer patients and their families through emotional , educational and financial support.  4135549804 ? [place] DSS Where to apply for food stamps, Medicaid and utility assistance. 418-515-0600 ? [facility]: Transportation to medical appointments. 909-463-7875 ? Social Security Administration: May apply for disability if have a Stage IV cancer. 408-830-5003 907-379-6139 ? [employer], Disability and Transit Services: Assists with nutrition, care and transit needs. [place] Support Programs: @10RELATIVEDAYS @ > Cancer Support Group  2nd Tuesday of the month 1pm-2pm, Journey Room  > Creative Journey  3rd Tuesday of the month 1130am-1pm, Journey Room  > Look Good Feel Better  1st Wednesday of the month 10am-12 noon, Journey Room (Call [place] to register [phone])

## 2016-10-17 NOTE — Assessment & Plan Note (Addendum)
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  Complicated by chemotherapy induced anemia and neutropenia.  On Xgeva for bone metastases.  Progression of disease on PET scan noted while on Halaven resulting in a change in therapy in accordance with recommendations from [facility] Dr. Mauro Kaufmann Muss: cyclophosphamide and methotrexate in accordance with Annals of Oncology 13: 73-80, 2002.  "Low-dose oral methotrexate and cyclophosphamide and metastatic breast cancer: Antitumor activity and correlation with vascular endothelial growth factor levels."  Oncology history is updated.  Labs today: CBC diff, CMET, CA 27.29, and CA15-3.  I personally reviewed and went over laboratory results with the patient.  The results are noted within this dictation.  Baseline EKG today.  Baseline EKG prior to initiating therapy is very stable and similar to EKG from 2017.  Prescriptions written:  Cyclophosphamide 50 mg/day  Methotrexate 2.5 mg BID on days 1, 2 every week  I have reviewed the risks, benefits, alternatives, and side effects of cyclophosphamide/methotrexate chemotherapy including, but not limited to, anaphylaxis, allergic reaction, decrease in blood counts, increased risk for infection, increased risk of bleeding, decreased appetite, nausea, vomiting, diarrhea, stomatitis, xerostomia, epigastric discomfort, increase in liver function tests, death.  I have provided the patient printed patient information regarding methotrexate and cyclophosphamide from up to date.  This information is printed and the patient has this to review at home.  She has been referred for chemotherapy teaching.    Once chemotherapy teaching has been completed and the patient has medication, she will start according to study outlined above:  1.  Methotrexate 2.5 mg twice daily on days 1 and 2 every week  2. Cyclophosphamide 50 mg daily.  I have  refilled her pain medication as well.  [place] Controlled Substance Reporting System is reviewed.   Labs in 2 weeks: CBC diff, CMET.   Labs in 4 weeks: CBC diff, CMET, CA 27.29, and CA 15-3.  According to the study: "Treatment was withheld and delayed for 1 week in case of neutrophil count less than 1000 and/or platelet count less than 75,000.  A 50% dose reduction in the total amount of drug administered in each cycle was prescribed after hematological recovery.  In the case of a neutrophil count of less than 1500, but greater than 1000 and/or platelet count less than 100,000, but greater than 75,000, therapy was administered with a 50% dose reduction in the total amount of drug administered in each cycle.  Re-escalation of drug doses was attempted if close monitoring was possible.    In the event of grade greater than or equal to 2 anorexia, nausea, vomiting, diarrhea, stomatitis, dryness of mouth, epigastric pain or increase in transaminases, all therapy was postponed until symptoms subsided.  A 50% reduction of combined cyclophosphamide and methotrexate therapy was performed for the next cycle, with subsequent re-escalation to full dosage if tolerated.  Any other non-hematological grade 3 toxicity was managed by 50% reduction of dosage in the next cycle, which was not commenced until full recovery had occurred.  Assessment of response was performed according to [facility] criteria after every 8 weeks of therapy."  Problem list reviewed with patient and edited accordingly.  Medications are reviewed with the patient and edited accordingly.  More than 50% of the time spent with the patient was utilized for counseling and coordination of care.

## 2016-10-17 NOTE — Progress Notes (Signed)
Debbie Reyes presents today for injection per MD orders. Xgeva 120 mg administered SQ in right Abdomen. Administration without incident. Patient tolerated well.

## 2016-10-17 NOTE — Progress Notes (Addendum)
Debbie Lair, MD [place] [address]  Metastatic breast cancer [facility]) - Plan: methotrexate (RHEUMATREX) 2.5 MG tablet, HYDROcodone-acetaminophen (NORCO/VICODIN) 5-325 MG tablet, CBC with Differential, Comprehensive metabolic panel, CBC with Differential, Comprehensive metabolic panel, Cancer antigen 27.29, Cancer antigen 15-3, DISCONTINUED: cyclophosphamide (CYTOXAN) 50 MG tablet  Bone metastases (HCC) - Plan: HYDROcodone-acetaminophen (NORCO/VICODIN) 5-325 MG tablet  Low serum vitamin B12  CURRENT THERAPY: Change in therapy per [facility] recommendations: cyclophosphamide and methotrexate.  Continue Xgeva monthly.  INTERVAL HISTORY: Debbie Reyes 76 y.o. 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 Complicated by chemotherapy induced anemia and neutropenia in the past. On Xgeva for bone metastases. AND Low vitamin B12, on ORAL replacement therapy.    Metastatic breast cancer ([place])   06/07/2005 Initial Biopsy    Biopsy L axillary nodal mass, no breast primary identified      08/11/2005 -  Chemotherapy    AC x 4, with 20% dose reduction on last cycle      11/21/2005 - 12/30/2005 Radiation Therapy    XRT 5040 cGY to L breast and L axillae with Dr. Isidore Moos      11/21/2005 - 12/31/2010 Anti-estrogen oral therapy    Arimidex X 5 years      01/20/2011 PET scan    Widespread hypermetabolic osseous metastatic disease. Index lesions are given above. No evidence of hypermetabolic metastatic disease involving the neck, chest, abdomen or pelvis.       01/25/2011 -  Radiation Therapy    1200 cGy to the left rib and 2050 cGy in 20 fractions to the T5-T10 vertebral bodies with a boost of 675 cGy in 3 fractions           01/25/2011 Treatment Plan Change    Faslodex initiated approximately on this date then stopped. Stopped due to failure to  respond. Faslodex given in a dose of 250 mg only 3 doses (July 17, July 31, August 14) and a single 500 mg dose March 24, 2011          04/25/2011 - 10/13/2011 Chemotherapy    Taxol chemotherapy given weekly 2 with one week off. Stopped due to progression          10/22/2011 - 05/11/2012 Anti-estrogen oral therapy    Aromasin and everolimus initiating but stopped due to progression          11/11/2011 Initial Biopsy    L3 vertebral body biopsy ER+PR+      11/11/2011 Procedure    Status post vertebral body augmentation for painful pathologic compression fractures at T11 and at L1 using the balloon kyphoplasty technique. Status post vertebral body augmentation for painful pathologic compression fracture at T8 using the vertebroplasty technique.      12/09/2012 - 06/10/2013 Anti-estrogen oral therapy    Fareston      04/22/2013 PET scan    Hypermetabolic left upper lobe nodule, possibly metastatic. The possibility of primary bronchogenic carcinoma is also considered. Residual hypermetabolic metastatic disease involving the liver and bones. Small left pleural effusion, stable or minimally increased from 03/07/2013.      06/11/2013 - 01/08/2014 Chemotherapy    Abraxane      01/31/2014 PET scan    Progressive metastatic lymphadenopathy within the right supraclavicular region and mediastinum.New hypermetabolic left adrenal metastasis. Mixed response of hypermetabolic liver metastases. Mild decrease in hypermetabolic activity associated with 1  cm left upper lobe pulmonary nodule. Diffuse bone metastases, with several in the thorax showing increased hypermetabolic activity consistent with mild progression of bone metastases.      02/01/2014 - 05/06/2015 Chemotherapy    XELODA 750 mg/m2 7 on/7 off       06/11/2014 PET scan    Partial metabolic response. Improving mediastinal lymphadenopathy, as above. Improving left adrenal metastasis. Prior hepatic metastasis is no longer  evident.Improving multifocal osseous metastases, as above      05/04/2015 PET scan    Dominant finding is new widespread skeletal metastasis involvingthe axillary and appendicular skeleton. New lesions are not discretely seen on the CT but recognized as new metabolic activity. New LEFT adrenal gland hypermetabolic metastasis. Foci of metabolic activity in the LEFT lung are felt to relate to atelectasis and/or infection. No clear evidence of pulmonary metastasis Moderate pleural LEFT pleural which could be sampled for cytology if clinically relevant.LEFT adrenal metastasis is actually an enlarged LEFT periaortic lymph node. This lymph node was enlarged and hypermetabolic to a greater degree FDG PET 01/31/2014 and subsequently improved and now has recurred on current exam (05/04/2015) as a hypermetabolic enlarged LEFT periaortic lymph node.      05/04/2015 Progression    PET with progression of disease      05/06/2015 Treatment Plan Change    Megace      08/14/2015 PET scan    Suspect new hypermetabolic metastasis in posterior right hepatic lobe. Consider abdomen MRI without and with contrast for further evaluation. No significant change in probable malignant left pleural effusion and left lung atelectasis.No significant change in diffuse osseous metastatic disease. Stable hypermetabolic left abdominal paraaortic lymph node, consistent with metastatic disease      09/09/2015 - 03/07/2016 Chemotherapy    IBRANCE/FASLODEX . Ibrance at 100 mg secondary to grade 3 neutropenia      03/03/2016 PET scan    Interval overall progression of disease. Interval development of new and progression of pre-existing lesions in the liver, hypermetabolic on PET imaging, consistent with metastatic disease. Persistent hypermetabolic patchy opacity in the anterior left upper lobe with persistent chronic left pleural effusion. Diffuse bony metastases again noted with interval progression ofFDG uptake associated with the  index bone lesions.      03/03/2016 Progression    PET demonstrates progression of disease.      03/11/2016 - 09/09/2016 Chemotherapy    Halaven D1, 8 every 21 days and with Neupogen support.       06/27/2016 PET scan    1. Overall mild improvement of multifocal metastatic disease. 2. Improvement in metabolic nodularity at the LEFT lung base. 3. Improvement in hepatic metastasis. 4. Overall improvement skeletal metastasis. Multiple foci of intense hypermetabolic activity remain within the skeleton remain.      09/27/2016 PET scan    Overall, mild progression is favored.  Status post right mastectomy with reconstruction.  Pulmonary metastases in the lingula, stable versus mildly increased. Associated central right lower lobe/ infrahilar nodule, grossly unchanged.  New right hilar nodal metastasis.  Moderate loculated left pleural effusion. Associated left lower lobe atelectasis.  Multifocal hepatic metastases, more conspicuous than on the prior, stable versus mildly increased.  Multifocal osseous metastases throughout the visualized axial and appendicular skeleton, grossly unchanged.      09/27/2016 Progression    PET scan demonstrates progression of disease.      10/11/2016 Miscellaneous    Consultation with geriatric oncologist, Dr. Arabella Merles at [facility]      10/17/2016 Treatment Plan  Change    Change in therapy to cyclophosphamide and methotrexate       She has been evaluated at [facility] with Dr. Alinda Money, geriatric oncologist.  He is provide recommendation regarding ongoing treatment.  His note is reviewed in detail.  Patient is doing well.  She denies any complaints today.  She notes that her pain medication that was last refilled was a lower number than she typically gets.  I corrected this moving forward.  She is concerned about being off therapy for almost 1 month.  She is reminded that she just saw Dr. Alinda Money last week and there are number of things that need to happen before  she starts chemotherapy.  She reports that she prefers afternoon appointments.    I spent time reviewing her new chemotherapy plan.  She will take methotrexate and cyclophosphamide as outlined in article "low-dose oral methotrexate and cyclophosphamide and metastatic breast cancer: Antitumor activity and correlation with vascular endothelial growth factor levels."  Review of Systems  Constitutional: Negative.  Negative for chills, fever and weight loss.  HENT: Negative.   Eyes: Negative.   Respiratory: Negative.  Negative for cough.   Cardiovascular: Negative.  Negative for chest pain.  Gastrointestinal: Negative.  Negative for blood in stool, constipation, diarrhea, melena, nausea and vomiting.  Genitourinary: Negative.   Musculoskeletal: Negative.   Skin: Negative.   Neurological: Negative.  Negative for weakness.  Endo/Heme/Allergies: Negative.   Psychiatric/Behavioral: Negative.     Past Medical History:  Diagnosis Date  . Bone metastases ([place]) 12/29/2015  . Cancer ([place])   . Low serum vitamin B12 03/18/2016  . Metastatic breast cancer ([place]) 12/29/2015    History reviewed. No pertinent surgical history.  History reviewed. No pertinent family history.  Social History   Social History  . Marital status: Widowed    Spouse name: N/A  . Number of children: N/A  . Years of education: N/A   Social History Main Topics  . Smoking status: Never Smoker  . Smokeless tobacco: Never Used  . Alcohol use No  . Drug use: No  . Sexual activity: No   Other Topics Concern  . None   Social History Narrative  . None     PHYSICAL EXAMINATION  ECOG PERFORMANCE STATUS: 0 - Asymptomatic  Vitals:   10/17/16 0846  BP: 130/67  Pulse: (!) 101  Resp: 16  Temp: 97.6 F (36.4 C)    GENERAL:alert, no distress, well nourished, well developed, comfortable, cooperative, smiling and unaccompanied SKIN: skin color, texture, turgor are normal, no rashes or significant lesions HEAD:  Normocephalic, No masses, lesions, tenderness or abnormalities EYES: normal, EOMI, Conjunctiva are pink and non-injected EARS: External ears normal OROPHARYNX:lips, buccal mucosa, and tongue normal and mucous membranes are moist  NECK: supple, trachea midline LYMPH:  not examined BREAST:not examined LUNGS: not examined HEART: not examined ABDOMEN:not examined BACK: Back symmetric, no curvature. EXTREMITIES:less then 2 second capillary refill, no joint deformities, effusion, or inflammation, no skin discoloration, no cyanosis  NEURO: alert & oriented x 3 with fluent speech, no focal motor/sensory deficits, gait normal   LABORATORY DATA: CBC    Component Value Date/Time   WBC 7.3 10/17/2016 0812   RBC 3.71 (L) 10/17/2016 0812   HGB 11.2 (L) 10/17/2016 0812   HCT 34.4 (L) 10/17/2016 0812   PLT 239 10/17/2016 0812   MCV 92.7 10/17/2016 0812   MCH 30.2 10/17/2016 0812   MCHC 32.6 10/17/2016 0812   RDW 18.7 (H) 10/17/2016 0812   LYMPHSABS  1.4 10/17/2016 0812   MONOABS 0.9 10/17/2016 0812   EOSABS 0.2 10/17/2016 0812   BASOSABS 0.0 10/17/2016 0812      Chemistry      Component Value Date/Time   NA 134 (L) 10/17/2016 0812   K 3.9 10/17/2016 0812   CL 102 10/17/2016 0812   CO2 21 (L) 10/17/2016 0812   BUN 19 10/17/2016 0812   CREATININE 1.01 (H) 10/17/2016 0812      Component Value Date/Time   CALCIUM 9.5 10/17/2016 0812   ALKPHOS 53 10/17/2016 0812   AST 31 10/17/2016 0812   ALT 11 (L) 10/17/2016 0812   BILITOT 0.4 10/17/2016 0812      Lab Results  Component Value Date   LABCA2 414.5 (H) 09/30/2016     PENDING LABS:   RADIOGRAPHIC STUDIES:  Nm Pet Image Restag (ps) Skull Base To Thigh  Result Date: 09/27/2016 CLINICAL DATA:  Subsequent treatment strategy for metastatic breast cancer. EXAM: NUCLEAR MEDICINE PET SKULL BASE TO THIGH TECHNIQUE: 5.0 mCi F-18 FDG was injected intravenously. Full-ring PET imaging was performed from the skull base to thigh after the  radiotracer. CT data was obtained and used for attenuation correction and anatomic localization. FASTING BLOOD GLUCOSE:  Value: 89 mg/dl COMPARISON:  PET-CT dated 06/27/2016 FINDINGS: NECK No hypermetabolic lymph nodes in the neck. CHEST Status post right mastectomy with reconstruction. Right hilar node, max SUV 6.2, new. Two lingular nodules, max SUV 5.8, previously 4.2. Central right lower lobe/ infrahilar nodule/node, max SUV 5.2, previously 5.7. Moderate loculated left pleural effusion. Patchy left lower lobe opacity, likely atelectasis. ABDOMEN/PELVIS Up to 3 hypermetabolic lesions in the liver. Lesion at the junction of segment 8 and 4A, max SUV 4.2, previously 3.7. Additional lesion in segment 2, max SUV 3.9, not well visualized on the prior. Additional lesion in segment 6, max SUV 3.1, not well visualized on the prior. No abnormal hypermetabolic activity within the pancreas, adrenal glands, or spleen. No hypermetabolic lymph nodes in the abdomen or pelvis. SKELETON Widespread/multifocal osseous metastases throughout the visualized axial and appendicular skeleton. Overall, this appearance is grossly unchanged.  For example: --Proximal right femur, max SUV 7.2, previously 4.0 --T4 vertebral body, max SUV 8.0, previously 9.8 --Right sternum, max SUV 9.0, previously 6.7 --Right inferior scapula, max SUV 4.6, previously 4.4 --L1 vertebral body, max SUV 5.2, previously 5.4 --Left posterior iliac bone, max SUV 12.4, previously 9.9 --Posterior column of the right acetabulum, max SUV 6.6, previously 7.2 --Right proximal femoral metaphysis, max SUV 5.1, previously 4.9 IMPRESSION: Overall, mild progression is favored. Status post right mastectomy with reconstruction. Pulmonary metastases in the lingula, stable versus mildly increased. Associated central right lower lobe/ infrahilar nodule, grossly unchanged. New right hilar nodal metastasis. Moderate loculated left pleural effusion. Associated left lower lobe  atelectasis. Multifocal hepatic metastases, more conspicuous than on the prior, stable versus mildly increased. Multifocal osseous metastases throughout the visualized axial and appendicular skeleton, grossly unchanged. Electronically Signed   By: Julian Hy M.D.   On: 09/27/2016 15:17     PATHOLOGY:  EKG:      ASSESSMENT AND PLAN:  Metastatic breast cancer (HCC) Stage IV breast cancer, ER/PR+ of left breast with bone metastases requiring vertebroplasty and kyphoplasty, heavily pre-treated disease.  Complicated by chemotherapy induced anemia and neutropenia.  On Xgeva for bone metastases.  Progression of disease on PET scan noted while on Halaven resulting in a change in therapy in accordance with recommendations from [facility] Dr. Mauro Kaufmann Muss: cyclophosphamide and methotrexate in accordance  with Annals of Oncology 13: 73-80, 2002.  "Low-dose oral methotrexate and cyclophosphamide and metastatic breast cancer: Antitumor activity and correlation with vascular endothelial growth factor levels."  Oncology history is updated.  Labs today: CBC diff, CMET, CA 27.29, and CA15-3.  I personally reviewed and went over laboratory results with the patient.  The results are noted within this dictation.  Baseline EKG today.  Baseline EKG prior to initiating therapy is very stable and similar to EKG from 2017.  Prescriptions written:  Cyclophosphamide 50 mg/day  Methotrexate 2.5 mg BID on days 1, 2 every week  I have reviewed the risks, benefits, alternatives, and side effects of cyclophosphamide/methotrexate chemotherapy including, but not limited to, anaphylaxis, allergic reaction, decrease in blood counts, increased risk for infection, increased risk of bleeding, decreased appetite, nausea, vomiting, diarrhea, stomatitis, xerostomia, epigastric discomfort, increase in liver function tests, death.  I have provided the patient printed patient information regarding methotrexate and cyclophosphamide  from up to date.  This information is printed and the patient has this to review at home.  She has been referred for chemotherapy teaching.    Once chemotherapy teaching has been completed and the patient has medication, she will start according to study outlined above:  1.  Methotrexate 2.5 mg twice daily on days 1 and 2 every week  2. Cyclophosphamide 50 mg daily.  I have refilled her pain medication as well.  [place] Controlled Substance Reporting System is reviewed.   Labs in 2 weeks: CBC diff, CMET.   Labs in 4 weeks: CBC diff, CMET, CA 27.29, and CA 15-3.  According to the study: "Treatment was withheld and delayed for 1 week in case of neutrophil count less than 1000 and/or platelet count less than 75,000.  A 50% dose reduction in the total amount of drug administered in each cycle was prescribed after hematological recovery.  In the case of a neutrophil count of less than 1500, but greater than 1000 and/or platelet count less than 100,000, but greater than 75,000, therapy was administered with a 50% dose reduction in the total amount of drug administered in each cycle.  Re-escalation of drug doses was attempted if close monitoring was possible.    In the event of grade greater than or equal to 2 anorexia, nausea, vomiting, diarrhea, stomatitis, dryness of mouth, epigastric pain or increase in transaminases, all therapy was postponed until symptoms subsided.  A 50% reduction of combined cyclophosphamide and methotrexate therapy was performed for the next cycle, with subsequent re-escalation to full dosage if tolerated.  Any other non-hematological grade 3 toxicity was managed by 50% reduction of dosage in the next cycle, which was not commenced until full recovery had occurred.  Assessment of response was performed according to [facility] criteria after every 8 weeks of therapy."  Problem list reviewed with patient and edited accordingly.  Medications are reviewed with the patient and  edited accordingly.  More than 50% of the time spent with the patient was utilized for counseling and coordination of care.  Bone metastases ([place]) Bone metastases requiring vertebroplasty and kyphoplasty in past.  On Ca++ and Vit D.  On Xgeva monthly.  Labs today: CMET.  I personally reviewed and went over laboratory results with the patient.  The results are noted within this dictation.  Xgeva today and monthly.  Low serum vitamin B12 Low Vitamin B12.  On oral B12 replacement therapy.    ORDERS PLACED FOR THIS ENCOUNTER: Orders Placed This Encounter  Procedures  . CBC with  Differential  . Comprehensive metabolic panel  . CBC with Differential  . Comprehensive metabolic panel  . Cancer antigen 27.29  . Cancer antigen 15-3    MEDICATIONS PRESCRIBED THIS ENCOUNTER: Meds ordered this encounter  Medications  . methotrexate (RHEUMATREX) 2.5 MG tablet    Sig: Take 2.5 mg on days 1 and 2 every week.  Caution:Chemotherapy. Protect from light.    Dispense:  8 tablet    Refill:  0    Cycle #1    Order Specific Question:   Supervising Provider    Answer:   Brunetta Genera [[id number]]  . DISCONTD: cyclophosphamide (CYTOXAN) 50 MG tablet    Sig: Take 1 tablet (50 mg total) by mouth daily. Give on an empty stomach 1 hour before or 2 hours after meals.    Dispense:  28 tablet    Refill:  0    Cycle #1    Order Specific Question:   Supervising Provider    Answer:   Brunetta Genera [[id number]]  . HYDROcodone-acetaminophen (NORCO/VICODIN) 5-325 MG tablet    Sig: Take 1 tablet by mouth every 6 (six) hours as needed. For pain    Dispense:  100 tablet    Refill:  0    Order Specific Question:   Supervising Provider    Answer:   Brunetta Genera [[id number]]    THERAPY PLAN:  Change in therapy to Cyclophosphamide and Methotrexate in the salvage setting.  All questions were answered. The patient knows to call the clinic with any problems, questions or concerns. We can  certainly see the patient much sooner if necessary.  Patient and plan discussed with Dr. Twana First and she is in agreement with the aforementioned.   This note is electronically signed by: Doy Mince 10/17/2016 4:51 PM

## 2016-10-17 NOTE — Assessment & Plan Note (Addendum)
Low Vitamin B12.  On oral B12 replacement therapy.

## 2016-10-17 NOTE — Telephone Encounter (Signed)
FAXED [place] [facility]

## 2016-10-18 LAB — CANCER ANTIGEN 27.29: CA 27.29: 558.9 U/mL — AB (ref 0.0–38.6)

## 2016-10-18 LAB — CANCER ANTIGEN 15-3: CA 15-3: 358.2 U/mL — ABNORMAL HIGH (ref 0.0–25.0)

## 2016-10-19 ENCOUNTER — Encounter (HOSPITAL_COMMUNITY): Payer: Medicare Other

## 2016-10-19 ENCOUNTER — Telehealth (HOSPITAL_COMMUNITY): Payer: Self-pay | Admitting: Emergency Medicine

## 2016-10-19 DIAGNOSIS — C7951 Secondary malignant neoplasm of bone: Secondary | ICD-10-CM | POA: Diagnosis not present

## 2016-10-19 DIAGNOSIS — C50919 Malignant neoplasm of unspecified site of unspecified female breast: Secondary | ICD-10-CM | POA: Diagnosis not present

## 2016-10-19 DIAGNOSIS — F4321 Adjustment disorder with depressed mood: Secondary | ICD-10-CM | POA: Diagnosis not present

## 2016-10-19 DIAGNOSIS — I10 Essential (primary) hypertension: Secondary | ICD-10-CM | POA: Diagnosis not present

## 2016-10-19 MED ORDER — FOLIC ACID 1 MG PO TABS: 1.0000 mg | ORAL_TABLET | Freq: Every day | ORAL | 2 refills | Status: AC

## 2016-10-19 NOTE — Patient Instructions (Signed)
[facility]   CHEMOTHERAPY INSTRUCTIONS  We are switching your treatment to cyclophosphamide and methotrexate.  You will take cyclophosphamide 50 mg daily.  You will take methotrexate 2.5 mg on days 1 and 2 every week.  You will see the doctor regularly throughout treatment.  We monitor your lab work prior to every treatment.  The doctor monitors your response to treatment by the way you are feeling, your blood work, and scans periodically.   POTENTIAL SIDE EFFECTS OF TREATMENT:  Cyclophosphomide: How Cyclophosphomide Is Given: Cyclophosphamide can be given can be given by a number of different routes. The route that it is given depends on the dosage, the condition being treated, as well as the purpose it is being used for.  It is usually given through a vein by injection or infusion (intravenous, IV) or by mouth in tablet form, depending upon the diagnosis.  Cyclophosphamide is also approved to be given by a shot into a muscle (IM), into the abdominal lining (intraperitoneal, IP), or into the lining of the lung (intrapleural).  Tablets should be given with food or after meals. Tablets should not be cut or crushed.  The amount of cyclophosphamide that you will receive depends on many factors, including your height and weight, your general health or other health problems, and the type of cancer or condition you have. Your doctor will determine your exact dosage and schedule.   Side Effects:  Important things to remember about the side effects of cyclophosphomide:  The side effects of cyclophosphomide and their severity depend on how much of the drug is given. In other words, high doses may produce more severe side effects.  You will not get all of the side effects mentioned below.  Side effects are often predictable in terms of their onset, duration, and severity.  Side effects are almost always reversible and will go away after therapy is complete.  Side  effects are quite manageable. There are many options to minimize or prevent them.  The following side effects are common (occurring in greater than 30%) for patients taking cyclophosphomide:  Low blood counts Your white and red blood cells and platelets may temporarily decrease. This can put you at increased risk for infection, anemia and/or bleeding.  Nadir: Meaning low point, nadir is the point in time between chemotherapy cycles in which you experience low blood counts.  Onset: 7 days  Nadir: 10-14 days  Recovery: 21 days  Hair loss Temporary - usually begins 3-6 weeks after the start of therapy. Hair will grow back after treatment is completed although the color and/or texture may be different.  Nausea and vomiting , more common with larger doses, usually beginning 6-10 hours after therapy.  Poor appetite  Loss of fertility Your ability to conceive or father a child may be affected by cyclophosphomide. Discuss this issue with your health care provider.  Discoloration of the skin or nails (see skin reactions).  The following are less common side effects for patients receiving cyclophosphomide:  Diarrhea  Mouth sores  Bladder irritation and bleeding (hemorrhagic cystitis) (see bladder problems) Delayed Effects of cyclophosphomide:  There is a slight risk of developing a blood cancer such as leukemia or myelodysplasia after taking cyclophosphomide. Talk to your doctor about this risk.  This list includes common and less common side effects for those taking cyclophosphomide. Side effects that are very rare are not listed here. But you should always inform your health care provider if you experience any  unusual symptoms.   When To Contact Your Doctor of Health Care Provider: Contact your health care provider immediately, day or night, if you should experience any of the following symptoms: Fever of 100.5 F (38 C) or higher, chills (possible signs of infection) The following symptoms require  medical attention, but are not an emergency. Contact your health care provider within 24 hours of noticing any of the following: Nausea (interferes with ability to eat and unrelieved with prescribed medication).  Vomiting (vomiting more than 4-5 times in a 24 hour period).  Diarrhea (4-6 episodes in a 24-hour period).  Unusual bleeding or bruising  Black or tarry stools, or blood in your stools.  Blood in the urine.  Pain or burning with urination.  Extreme fatigue (unable to carry on self-care activities).  Mouth sores (painful redness, swelling or ulcers). Always inform your health care provider if you experience any unusual symptoms.  Precautions:  Before starting cyclophosphamide treatment, make sure you tell your doctor about any other medications you are taking (including prescription, over-the-counter, vitamins, herbal remedies, etc. Do not take aspirin, products containing aspirin unless your doctor specifically permits this.  Do not receive any kind of immunization or vaccination without your doctor's approval while taking cyclophosphamide.  For both men and women: Use contraceptives, and do not conceive a child (get pregnant) while taking cyclophosphamide. Barrier methods of contraception, such as condoms, are recommended.  Do not breast feed while taking this medication.  Self-Care Tips: Drink at least two to three quarts of fluid every 24 hours, unless you are instructed otherwise.  It is important to void (empty your bladder) frequently especially in the first 24 hours after taking cyclophosphamide. Report any pain or burning on urination to your health care provider.  You may be at risk of infection so try to avoid crowds or people with colds, and report fever or any other signs of infection immediately to your health care provider.  Wash your hands often  To help treat/prevent mouth sores, use a soft toothbrush, and rinse three times a day with 1 teaspoon of baking soda mixed  with 8 ounces of water.  Use an electric razor and a soft toothbrush to minimize bleeding.  Avoid contact sports or activities that could cause injury.  To reduce nausea, take anti-nausea medications as prescribed by your doctor, and eat small, frequent meals.  Avoid sun exposure. Wear SPF 42 (or higher) sunblock and protective clothing.  In general, drinking alcoholic beverages should be kept to a minimum or avoided completely. You should discuss this with your doctor.  Get plenty of rest.  Maintain good nutrition.  If you experience symptoms or side effects, be sure to discuss them with your health care team. They can prescribe medications and/or offer other suggestions that are effective in managing such problems.   Methotrexate: This medicine can be taken with or without food. When you take methotrexate, you can develop levels of folate that are lower than normal. This is because methotrexate causes your body to get rid of more folate as waste than usual. This effect causes folate deficiency. Your doctor can prescribe the supplement folic acid to help prevent a folate deficiency.  Side Effects: Important things to remember about the side effects of methotrexate:  Most people do not experience all of the side effects listed.  Side effects are often predictable in terms of their onset and duration.  Side effects are almost always reversible and will go away after treatment is complete.  There are many options to help minimize or prevent side effects.  There is no relationship between the presence or severity of side effects and the effectiveness of the medication.  The side effects of methotrexate and their severity depend on how much of the drug is given. In other words, high doses may produce more severe side effects.  In some cases leucovorin infusion (see leucovorin) may be given 24 hours after methotrexate to lessen the side effects of methotrexate.  The following side effects are  common (occurring in greater than 30%) for patients taking methotrexate:  Low blood counts . Your white and red blood cells and platelets may temporarily decrease. This can put you at increased risk for infection, anemia and/or bleeding.  Nadir: Meaning low point, nadir is the point in time between chemotherapy cycles in which you experience low blood counts.  Onset: 7 days  Nadir: 10 days  Recovery: 21 days  Mouth sores (usually occur 3-7 days after treatment)  Poor appetite  These side effects are less common side effects (occurring in about 10-29%) of patients receiving methotrexate:  Kidney toxicity (see kidney problems) particularly with high-doses. In severe cases can lead to kidney failure. Care is taken to make sure patient is well hydrated with IV fluids before infusion of high-dose methotrexate.  Skin rash , reddening of the skin (with high doses).  Diarrhea  Nausea and Vomiting(uncommon with low dose)  Increases in blood tests measuring liver function, often seen with high dose treatment. These return to normal within about 10 days (see liver problems).  Darkening of the skin where previous radiation treatment has been given (radiation recall - see skin reactions).  Loss of fertility. Meaning, your ability to conceive or father a child may be affected by methotrexate. Discuss this issue with your health care provider. (see fertility).  Alopecia  Skin photosensitivity  Side effects specific to intrathecal administration of methotrexate (the drug is infused directly into the cerebrospinal fluid (CSF) the fluid that is surrounding the brain and spinal cord):  Acute chemical arachnoiditis: a syndrome that can be seen immediately after the infusion of methotrexate intrathecally. It is an inflammation of the membrane surrounding the brain and spinal column. Symptoms are: severe headache, stiff neck, seizures, vomiting, and fever.  Central neurotoxicity: Less-common, seen with intrathecal or  very high IV dose methotrexate. Symptoms are; difficulty with speech, paralysis of the arms and legs, seizures, or coma. This may develop within 6 days of treatment and resolves within 48-72 hours.  Some serious, but uncommon side effects of Methotrexate may include acute renal failure, bone marrow suppression, serious skin reactions, gastrointestinal and liver toxicity, acute or chronic interstitial pneumonitis, opportunistic infections (especially Pneumocystis carinii pneumonia), secondary malignancies such as lymphoma, "tumor lysis syndrome" in patients with rapidly growing tumors.  Methotrexate given concomitantly with radiotherapy may increase the risk of soft tissue necrosis and osteonecrosis.  Not all side effects are listed above. Some that are rare (occurring in less than 10% of patients) are not listed here. However, you should always inform your health care provider if you experience any unusual symptoms.   When to contact your doctor or health care provider: Contact your health care provider immediately, day or night, if you should experience any of the following symptoms:  Fever of 100.5 F (38 C) or higher, chills (possible signs of infection)  The following symptoms require medical attention, but are not an emergency. Contact your health care provider within 24 hours of noticing any of the  following :  Unusual bleeding or bruising  Black or tarry stools, or blood in your stools or urine  Extreme fatigue (unable to carry on self-care activities)  Mouth sores (painful redness, swelling or ulcers)  Nausea (interferes with ability to eat and unrelieved with prescribed medication)  Vomiting (vomiting more than 4-5 times in a 24 hour period)  Diarrhea (4-6 episodes in a 24-hour period)  No urine output in a 12 hour period  Yellowing of the skin or eyes  Severe skin reaction within days of methotrexate administration (see severe hypersensitivity skin reactions)  Dry, unproductive cough   Trouble thinking or speaking, change in eyesight, severe headache, or seizures  Swelling of the feet or ankles. Sudden weight gain.  Signs of infection such as redness or swelling, pain on swallowing, coughing up mucous, or painful urination.  Unable to eat or drink for 24 hours or have signs of dehydration: tiredness, thirst, dry mouth, dark and decrease amount of urine, or dizziness.  Always inform your health care provider if you experience any unusual symptoms.   Precautions: Before starting methotrexate treatment, make sure you tell your doctor about any other medications you are taking (including prescription, over-the-counter, vitamins, herbal remedies, etc.). Do not take aspirin, or products containing aspirin unless your doctor specifically permits this.  Do not take non-steroidal anti-inflammatory drugs (NSAIDS) such as ibuprofen or naproxen unless your doctor specifically permits this. Bone marrow suppression, intestinal irritation and severe anemia have occurred with combined usage of non-steroidal anti-inflammatory drugs (NSAIDS) and methotrexate.  Folic acid supplements should be avoided while on methotrexate. Folic acid may counteract the anti-cancer effects of methotrexate.  If you are on warfarin (Coumadin) as a blood-thinner, adjustments may need to be made to your dose based on blood work.  Do not receive any kind of immunization or vaccination without your doctor's approval while taking methotrexate.  Inform your health care professional if you are pregnant or may be pregnant prior to starting this treatment. Pregnancy category X (methotrexate may be hazardous to the fetus. Women who are pregnant or become pregnant must be advised of the potential hazard to the fetus).  For both men and women: Do not conceive a child (get pregnant) while taking methotrexate. Barrier methods of contraception, such as condoms, are recommended. Pregnancy should be avoided for > 3 months following  treatment in female patients and > 1 ovulatory cycle in female patients. Discuss with your doctor when you may safely become pregnant or conceive a child after therapy.  Do not breast feed while taking this medication.   Self-Care Tips: Drink at least two to three quarts of fluid every 24 hours, unless you are instructed otherwise.  You may be at risk of infection so try to avoid crowds or people with colds and those not feeling well, and report fever or any other signs of infection immediately to your health care provider.  Wash your hands often.  To help treat/prevent mouth sores, use a soft toothbrush, and rinse three times a day with 1/2 to 1 teaspoon of baking soda and/or 1/2 to 1 teaspoon of salt mixed with 8 ounces of water.  Use an electric razor and a soft toothbrush to minimize bleeding.  Avoid contact sports or activities that could cause injury.  To reduce nausea, take anti-nausea medications as prescribed by your doctor, and eat small, frequent meals.  Avoid sun exposure. Wear SPF 35 (or higher) sunblock and protective clothing.  In general, drinking alcoholic beverages should be kept  to a minimum or avoided completely. You should discuss this with your doctor.  Get plenty of rest.  Maintain good nutrition.  If you experience symptoms or side effects, be sure to discuss them with your health care team. They can prescribe medications and/or offer other suggestions that are effective in managing such problems.      SELF CARE ACTIVITIES WHILE ON CHEMOTHERAPY: Hydration Increase your fluid intake 48 hours prior to treatment and drink at least 8 to 12 cups (64 ounces) of water/decaff beverages per day after treatment. You can still have your cup of coffee or soda but these beverages do not count as part of your 8 to 12 cups that you need to drink daily. No alcohol intake.  Medications Continue taking your normal prescription medication as prescribed.  If you start any new herbal or new  supplements please let us know first to make sure it is safe.  Mouth Care Have teeth cleaned professionally before starting treatment. Keep dentures and partial plates clean. Use soft toothbrush and do not use mouthwashes that contain alcohol. Biotene is a good mouthwash that is available at most pharmacies or may be ordered by calling [phone]. Use warm salt water gargles (1 teaspoon salt per 1 quart warm water) before and after meals and at bedtime. Or you may rinse with 2 tablespoons of three-percent hydrogen peroxide mixed in eight ounces of water. If you are still having problems with your mouth or sores in your mouth please call the clinic. If you need dental work, please let the doctor know before you go for your appointment so that we can coordinate the best possible time for you in regards to your chemo regimen. You need to also let your dentist know that you are actively taking chemo. We may need to do labs prior to your dental appointment.   Skin Care Always use sunscreen that has not expired and with SPF (Sun Protection Factor) of 50 or higher. Wear hats to protect your head from the sun. Remember to use sunscreen on your hands, ears, face, & feet.  Use good moisturizing lotions such as udder cream, eucerin, or even Vaseline. Some chemotherapies can cause dry skin, color changes in your skin and nails.    . Avoid long, hot showers or baths. . Use gentle, fragrance-free soaps and laundry detergent. . Use moisturizers, preferably creams or ointments rather than lotions because the thicker consistency is better at preventing skin dehydration. Apply the cream or ointment within 15 minutes of showering. Reapply moisturizer at night, and moisturize your hands every time after you wash them.  Hair Loss (if your doctor says your hair will fall out)  . If your doctor says that your hair is likely to fall out, decide before you begin chemo whether you want to wear a wig. You may want to shop  before treatment to match your hair color. . Hats, turbans, and scarves can also camouflage hair loss, although some people prefer to leave their heads uncovered. If you go bare-headed outdoors, be sure to use sunscreen on your scalp. . Cut your hair short. It eases the inconvenience of shedding lots of hair, but it also can reduce the emotional impact of watching your hair fall out. . Don't perm or color your hair during chemotherapy. Those chemical treatments are already damaging to hair and can enhance hair loss. Once your chemo treatments are done and your hair has grown back, it's OK to resume dyeing or perming hair. With  chemotherapy, hair loss is almost always temporary. But when it grows back, it may be a different color or texture. In older adults who still had hair color before chemotherapy, the new growth may be completely gray.  Often, new hair is very fine and soft.  Infection Prevention Please wash your hands for at least 30 seconds using warm soapy water. Handwashing is the #1 way to prevent the spread of germs. Stay away from sick people or people who are getting over a cold. If you develop respiratory systems such as green/yellow mucus production or productive cough or persistent cough let us know and we will see if you need an antibiotic. It is a good idea to keep a pair of gloves on when going into grocery stores/[employer] to decrease your risk of coming into contact with germs on the carts, etc. Carry alcohol hand gel with you at all times and use it frequently if out in public. If your temperature reaches 100.5 or higher please call the clinic and let us know.  If it is after hours or on the weekend please go to the ER if your temperature is over 100.5.  Please have your own personal thermometer at home to use.    Sex and bodily fluids If you are going to have sex, a condom must be used to protect the person that isn't taking chemotherapy. Chemo can decrease your libido (sex drive). For  a few days after chemotherapy, chemotherapy can be excreted through your bodily fluids.  When using the toilet please close the lid and flush the toilet twice.  Do this for a few day after you have had chemotherapy.    Effects of chemotherapy on your sex life Some changes are simple and won't last long. They won't affect your sex life permanently. Sometimes you may feel: . too tired . not strong enough to be very active . sick or sore  . not in the mood . anxious or low Your anxiety might not seem related to sex. For example, you may be worried about the cancer and how your treatment is going. Or you may be worried about money, or about how you family are coping with your illness. These things can cause stress, which can affect your interest in sex. It's important to talk to your partner about how you feel. Remember - the changes to your sex life don't usually last long. There's usually no medical reason to stop having sex during chemo. The drugs won't have any long term physical effects on your performance or enjoyment of sex. Cancer can't be passed on to your partner during sex  Contraception It's important to use reliable contraception during treatment. Avoid getting pregnant while you or your partner are having chemotherapy. This is because the drugs may harm the baby. Sometimes chemotherapy drugs can leave a man or woman infertile.  This means you would not be able to have children in the future. You might want to talk to someone about permanent infertility. It can be very difficult to learn that you may no longer be able to have children. Some people find counselling helpful. There might be ways to preserve your fertility, although this is easier for men than for women. You may want to speak to a fertility expert. You can talk about sperm banking or harvesting your eggs. You can also ask about other fertility options, such as donor eggs. If you have or have had breast cancer, your doctor  might advise you not to  take the contraceptive pill. This is because the hormones in it might affect the cancer.  It is not known for sure whether or not chemotherapy drugs can be passed on through semen or secretions from the vagina. Because of this some doctors advise people to use a barrier method if you have sex during treatment. This applies to vaginal, anal or oral sex. Generally, doctors advise a barrier method only for the time you are actually having the treatment and for about a week after your treatment. Advice like this can be worrying, but this does not mean that you have to avoid being intimate with your partner. You can still have close contact with your partner and continue to enjoy sex.  Animals If you have cats or birds we just ask that you not change the litter or change the cage.  Please have someone else do this for you while you are on chemotherapy.   Food Safety During and After Cancer Treatment Food safety is important for people both during and after cancer treatment. Cancer and cancer treatments, such as chemotherapy, radiation therapy, and stem cell/bone marrow transplantation, often weaken the immune system. This makes it harder for your body to protect itself from foodborne illness, also called food poisoning. Foodborne illness is caused by eating food that contains harmful bacteria, parasites, or viruses.  Foods to avoid Some foods have a higher risk of becoming tainted with bacteria. These include: [name] Unwashed fresh fruit and vegetables, especially leafy vegetables that can hide dirt and other contaminants . Raw sprouts, such as alfalfa sprouts . Raw or undercooked beef, especially ground beef, or other raw or undercooked meat and poultry . Fatty, fried, or spicy foods immediately before or after treatment.  These can sit heavy on your stomach and make you feel nauseous. . Raw or undercooked shellfish, such as oysters. . Sushi and sashimi, which often contain raw fish.   . Unpasteurized beverages, such as unpasteurized fruit juices, raw milk, raw yogurt, or cider . Undercooked eggs, such as soft boiled, over easy, and poached; raw, unpasteurized eggs; or foods made with raw egg, such as homemade raw cookie dough and homemade mayonnaise Simple steps for food safety Shop smart. . Do not buy food stored or displayed in an unclean area. . Do not buy bruised or damaged fruits or vegetables. . Do not buy cans that have cracks, dents, or bulges. . Pick up foods that can spoil at the end of your shopping trip and store them in a cooler on the way home. Prepare and clean up foods carefully. . Rinse all fresh fruits and vegetables under running water, and dry them with a clean towel or paper towel. . Clean the top of cans before opening them. . After preparing food, wash your hands for 20 seconds with hot water and soap. Pay special attention to areas between fingers and under nails. . Clean your utensils and dishes with hot water and soap. [name] Disinfect your kitchen and cutting boards using 1 teaspoon of liquid, unscented bleach mixed into 1 quart of water.   Dispose of old food. . Eat canned and packaged food before its expiration date (the "use by" or "best before" date). . Consume refrigerated leftovers within 3 to 4 days. After that time, throw out the food. Even if the food does not smell or look spoiled, it still may be unsafe. Some bacteria, such as Listeria, can grow even on foods stored in the refrigerator if they are kept for too  long. Take precautions when eating out. . At restaurants, avoid buffets and salad bars where food sits out for a long time and comes in contact with many people. Food can become contaminated when someone with a virus, often a norovirus, or another "bug" handles it. . Put any leftover food in a "to-go" container yourself, rather than having the server do it. And, refrigerate leftovers as soon as you get home. . Choose restaurants that  are clean and that are willing to prepare your food as you order it cooked.    MEDICATIONS: Folic Acid 1mg  tablets:  Take 1 mg daily  Zofran/Ondansetron 8mg  tablet. Take 1 tablet every 8 hours as needed for nausea/vomiting. (#1 nausea med to take, this can constipate)  Compazine/Prochlorperazine 10mg  tablet. Take 1 tablet every 6 hours as needed for nausea/vomiting. (#2 nausea med to take, this can make you sleepy)   EMLA cream. Apply a quarter size amount to port site 1 hour prior to chemo. Do not rub in. Cover with plastic wrap.   Over-the-Counter Meds:  Miralax 1 capful in 8 oz of fluid daily. May increase to two times a day if needed. This is a stool softener. If this doesn't work proceed you can add:  Senokot S-start with 1 tablet two times a day and increase to 4 tablets two times a day if needed. (total of 8 tablets in a 24 hour period). This is a stimulant laxative.   Call us if this does not help your bowels move.   Imodium 2mg  capsule. Take 2 capsules after the 1st loose stool and then 1 capsule every 2 hours until you go a total of 12 hours without having a loose stool. Call the [facility] if loose stools continue. If diarrhea occurs @ bedtime, take 2 capsules @ bedtime. Then take 2 capsules every 4 hours until morning. Call [facility].     Constipation Sheet *Miralax in 8 oz of fluid daily.  May increase to two times a day if needed.  This is a stool softener.  If this not enough to keep your bowel regular:  You can add:  *Senokot S, start with one tablet twice a day and can increase to 4 tablets twice a day if needed.  This is a stimulant laxative.   Sometimes when you take pain medication you need BOTH a medicine to keep your stool soft and a medicine to help your bowel push it out!  Please call if the above does not work for you.   Do not go more than 2 days without a bowel movement.  It is very important that you do not become constipated.  It will make  you feel sick to your stomach (nausea) and can cause abdominal pain and vomiting.    Diarrhea Sheet  If you are having loose stools/diarrhea, please purchase Imodium and begin taking as outlined:  At the first sign of poorly formed or loose stools you should begin taking Imodium(loperamide) 2 mg capsules.  Take two caplets (4mg ) followed by one caplet (2mg ) every 2 hours until you have had no diarrhea for 12 hours.  During the night take two caplets (4mg ) at bedtime and continue every 4 hours during the night until the morning.  Stop taking Imodium only after there is no sign of diarrhea for 12 hours.    Always call the [facility] if you are having loose stools/diarrhea that you can't get under control.  Loose stools/disrrhea leads to dehydration (loss of water)  in your body.  We have other options of trying to get the loose stools/diarrhea to stopped but you must let us know!    Nausea Sheet  Zofran/Ondansetron 8mg  tablet. Take 1 tablet every 8 hours as needed for nausea/vomiting. (#1 nausea med to take, this can constipate)  Compazine/Prochlorperazine 10mg  tablet. Take 1 tablet every 6 hours as needed for nausea/vomiting. (#2 nausea med to take, this can make you sleepy)  You can take these medications together or separately.  We would first like for you to try the Ondansetron by itself and then take the Prochloperizine if needed. But you are allowed to take both medications at the same time if your nausea is that severe.  If you are having persistent nausea (nausea that does not stop) please take these medications on a staggered schedule so that the nausea medication stays in your body.  Please call the [facility] and let us know the amount of nausea that you are experiencing.  If you begin to vomit, you need to call the [facility] and if it is the weekend and you have vomited more than one time and cant get it to stop-go to the Emergency Room.  Persistent nausea/vomiting can lead to  dehydration (loss of fluid in your body) and will make you feel terrible.   Ice chips, sips of clear liquids, foods that are @ room temperature, crackers, and toast tend to be better tolerated.     SYMPTOMS TO REPORT AS SOON AS POSSIBLE AFTER TREATMENT:  FEVER GREATER THAN 100.5 F  CHILLS WITH OR WITHOUT FEVER  NAUSEA AND VOMITING THAT IS NOT CONTROLLED WITH YOUR NAUSEA MEDICATION  UNUSUAL SHORTNESS OF BREATH  UNUSUAL BRUISING OR BLEEDING  TENDERNESS IN MOUTH AND THROAT WITH OR WITHOUT PRESENCE OF ULCERS  URINARY PROBLEMS  BOWEL PROBLEMS  UNUSUAL RASH    Wear comfortable clothing and clothing appropriate for easy access to any Portacath or PICC line. Let us know if there is anything that we can do to make your therapy better!    What to do if you need assistance after hours or on the weekends: CALL [phone].  HOLD on the line, do not hang up.  You will hear multiple messages but at the end you will be connected with a nurse triage line.  They will contact the doctor if necessary.  Most of the time they will be able to assist you.  Do not call the hospital operator.      I have been informed and understand all of the instructions given to me and have received a copy. I have been instructed to call the clinic [phone] or my family physician as soon as possible for continued medical care, if indicated. I do not have any more questions at this time but understand that I may call the [facility] or the Patient Navigator at [phone] during office hours should I have questions or need assistance in obtaining follow-up care.

## 2016-10-19 NOTE — Telephone Encounter (Signed)
Called pt to go over new chemotherapy drugs.  Pt is expected to get drugs tomorrow.  Called folic acid 1 mg into pharmacy for pt to take daily.    Chemotherapy teaching pulled together.

## 2016-10-21 ENCOUNTER — Ambulatory Visit (HOSPITAL_COMMUNITY): Payer: Medicare Other

## 2016-10-21 ENCOUNTER — Other Ambulatory Visit (HOSPITAL_COMMUNITY): Payer: Medicare Other

## 2016-10-24 ENCOUNTER — Telehealth (HOSPITAL_COMMUNITY): Payer: Self-pay | Admitting: Emergency Medicine

## 2016-10-24 ENCOUNTER — Other Ambulatory Visit (HOSPITAL_COMMUNITY): Payer: Self-pay | Admitting: Oncology

## 2016-10-24 DIAGNOSIS — C50919 Malignant neoplasm of unspecified site of unspecified female breast: Secondary | ICD-10-CM

## 2016-10-24 MED ORDER — METHOTREXATE 2.5 MG PO TABS: ORAL_TABLET | ORAL | 0 refills | Status: DC

## 2016-10-24 NOTE — Telephone Encounter (Signed)
Pt called bc her methotrexate was suppose to be 2.5 mg  Two times a day.  I spoke with Kirby Crigler PA and he said it was suppose to be 2 times a day but the script was written for one time a day.  He wrote for a new prescription.  I called the pt to let her know that she is suppose to take it two times a day.

## 2016-10-31 ENCOUNTER — Encounter (HOSPITAL_COMMUNITY): Payer: Medicare Other

## 2016-10-31 ENCOUNTER — Other Ambulatory Visit (HOSPITAL_COMMUNITY): Payer: Medicare Other

## 2016-10-31 ENCOUNTER — Encounter (HOSPITAL_BASED_OUTPATIENT_CLINIC_OR_DEPARTMENT_OTHER): Payer: Medicare Other | Admitting: Oncology

## 2016-10-31 ENCOUNTER — Encounter (HOSPITAL_COMMUNITY): Payer: Self-pay

## 2016-10-31 VITALS — BP 114/63 | HR 99 | Resp 18 | Wt 92.7 lb

## 2016-10-31 DIAGNOSIS — C50912 Malignant neoplasm of unspecified site of left female breast: Secondary | ICD-10-CM | POA: Diagnosis not present

## 2016-10-31 DIAGNOSIS — E538 Deficiency of other specified B group vitamins: Secondary | ICD-10-CM

## 2016-10-31 DIAGNOSIS — C50919 Malignant neoplasm of unspecified site of unspecified female breast: Secondary | ICD-10-CM | POA: Diagnosis not present

## 2016-10-31 DIAGNOSIS — C7951 Secondary malignant neoplasm of bone: Secondary | ICD-10-CM | POA: Diagnosis not present

## 2016-10-31 DIAGNOSIS — D6481 Anemia due to antineoplastic chemotherapy: Secondary | ICD-10-CM | POA: Diagnosis not present

## 2016-10-31 LAB — COMPREHENSIVE METABOLIC PANEL WITH GFR
ALT: 13 U/L — ABNORMAL LOW (ref 14–54)
AST: 32 U/L (ref 15–41)
Albumin: 3.7 g/dL (ref 3.5–5.0)
Alkaline Phosphatase: 60 U/L (ref 38–126)
Anion gap: 8 (ref 5–15)
BUN: 16 mg/dL (ref 6–20)
CO2: 24 mmol/L (ref 22–32)
Calcium: 9.1 mg/dL (ref 8.9–10.3)
Chloride: 99 mmol/L — ABNORMAL LOW (ref 101–111)
Creatinine, Ser: 0.88 mg/dL (ref 0.44–1.00)
GFR calc Af Amer: >60 mL/min
GFR calc non Af Amer: >60 mL/min
Glucose, Bld: 110 mg/dL — ABNORMAL HIGH (ref 65–99)
Potassium: 3.8 mmol/L (ref 3.5–5.1)
Sodium: 131 mmol/L — ABNORMAL LOW (ref 135–145)
Total Bilirubin: 0.5 mg/dL (ref 0.3–1.2)
Total Protein: 7.2 g/dL (ref 6.5–8.1)

## 2016-10-31 LAB — CBC WITH DIFFERENTIAL/PLATELET
Basophils Absolute: 0 K/uL (ref 0.0–0.1)
Basophils Relative: 0 %
Eosinophils Absolute: 0.1 K/uL (ref 0.0–0.7)
Eosinophils Relative: 2 %
HCT: 32.9 % — ABNORMAL LOW (ref 36.0–46.0)
Hemoglobin: 10.7 g/dL — ABNORMAL LOW (ref 12.0–15.0)
Lymphocytes Relative: 21 %
Lymphs Abs: 0.9 K/uL (ref 0.7–4.0)
MCH: 30.1 pg (ref 26.0–34.0)
MCHC: 32.5 g/dL (ref 30.0–36.0)
MCV: 92.7 fL (ref 78.0–100.0)
Monocytes Absolute: 0.5 K/uL (ref 0.1–1.0)
Monocytes Relative: 12 %
Neutro Abs: 2.9 K/uL (ref 1.7–7.7)
Neutrophils Relative %: 65 %
Platelets: 218 K/uL (ref 150–400)
RBC: 3.55 MIL/uL — ABNORMAL LOW (ref 3.87–5.11)
RDW: 17.8 % — ABNORMAL HIGH (ref 11.5–15.5)
WBC: 4.4 K/uL (ref 4.0–10.5)

## 2016-10-31 NOTE — Patient Instructions (Addendum)
[facility] at [facility] Discharge Instructions  RECOMMENDATIONS MADE BY THE CONSULTANT AND ANY TEST RESULTS WILL BE SENT TO YOUR REFERRING PHYSICIAN.  You were seen today by Dr. Louise Zhou Follow up in 4 weeks with lab work   Thank you for choosing [facility] at [facility] to provide your oncology and hematology care.  To afford each patient quality time with our provider, please arrive at least 15 minutes before your scheduled appointment time.    If you have a lab appointment with the Cancer Center please come in thru the  Main Entrance and check in at the main information desk  You need to re-schedule your appointment should you arrive 10 or more minutes late.  We strive to give you quality time with our providers, and arriving late affects you and other patients whose appointments are after yours.  Also, if you no show three or more times for appointments you may be dismissed from the clinic at the providers discretion.     Again, thank you for choosing [facility].  Our hope is that these requests will decrease the amount of time that you wait before being seen by our physicians.       _____________________________________________________________  Should you have questions after your visit to [facility], please contact our office at [phone] between the hours of 8:30 a.m. and 4:30 p.m.  Voicemails left after 4:30 p.m. will not be returned until the following business day.  For prescription refill requests, have your pharmacy contact our office.       Resources For Cancer Patients and their Caregivers ? American Cancer Society: Can assist with transportation, wigs, general needs, runs Look Good Feel Better.        1-888-227-6333 ? Cancer Care: Provides financial assistance, online support groups, medication/co-pay assistance.  1-800-813-HOPE (4673) ? [facility] Assists  [place] cancer patients and their families through emotional , educational and financial support.  336-427-4357 ? [place] DSS Where to apply for food stamps, Medicaid and utility assistance. 336-342-1394 ? [facility]: Transportation to medical appointments. 336-347-2287 ? Social Security Administration: May apply for disability if have a Stage IV cancer. 336-342-7796 1-800-772-1213 ? [place] [facility]ging, Disability and Transit Services: Assists with nutrition, care and transit needs. 336-349-2343  Cancer Center Support Programs: [email or web address]@ > Cancer Support Group  2nd Tuesday of the month 1pm-2pm, Journey Room  > Creative Journey  3rd Tuesday of the month 1130am-1pm, Journey Room  > Look Good Feel Better  1st Wednesday of the month 10am-12 noon, Journey Room (Call American Cancer Society to register 1-800-395-5775)    

## 2016-10-31 NOTE — Progress Notes (Signed)
[name]    HEMATOLOGY/ONCOLOGY PROGRESS NOTE  Date of Service: 03/16/2016  Patient Care Team: Zella Richer. Scotty Court, MD as PCP - General (Unknown Physician Specialty)  CHIEF COMPLAINTS:  Continued management of metastatic breast cancer    Metastatic breast cancer ([place])   06/07/2005 Initial Biopsy    Biopsy L axillary nodal mass, no breast primary identified      08/11/2005 -  Chemotherapy    AC x 4, with 20% dose reduction on last cycle      11/21/2005 - 12/30/2005 Radiation Therapy    XRT 5040 cGY to L breast and L axillae with Dr. Isidore Moos      11/21/2005 - 12/31/2010 Anti-estrogen oral therapy    Arimidex X 5 years      01/20/2011 PET scan    Widespread hypermetabolic osseous metastatic disease. Index lesions are given above. No evidence of hypermetabolic metastatic disease involving the neck, chest, abdomen or pelvis.       01/25/2011 -  Radiation Therapy    1200 cGy to the left rib and 2050 cGy in 20 fractions to the T5-T10 vertebral bodies with a boost of 675 cGy in 3 fractions           01/25/2011 Treatment Plan Change    Faslodex initiated approximately on this date then stopped. Stopped due to failure to respond. Faslodex given in a dose of 250 mg only 3 doses (July 17, July 31, August 14) and a single 500 mg dose March 24, 2011          04/25/2011 - 10/13/2011 Chemotherapy    Taxol chemotherapy given weekly 2 with one week off. Stopped due to progression          10/22/2011 - 05/11/2012 Anti-estrogen oral therapy    Aromasin and everolimus initiating but stopped due to progression          11/11/2011 Initial Biopsy    L3 vertebral body biopsy ER+PR+      11/11/2011 Procedure    Status post vertebral body augmentation for painful pathologic compression fractures at T11 and at L1 using the balloon kyphoplasty technique. Status post vertebral body augmentation for painful pathologic compression fracture at T8 using the vertebroplasty technique.      12/09/2012 - 06/10/2013 Anti-estrogen oral therapy    Fareston      04/22/2013 PET scan    Hypermetabolic left upper lobe nodule, possibly metastatic. The possibility of primary bronchogenic carcinoma is also considered. Residual hypermetabolic metastatic disease involving the liver and bones. Small left pleural effusion, stable or minimally increased from 03/07/2013.      06/11/2013 - 01/08/2014 Chemotherapy    Abraxane      01/31/2014 PET scan    Progressive metastatic lymphadenopathy within the right supraclavicular region and mediastinum.New hypermetabolic left adrenal metastasis. Mixed response of hypermetabolic liver metastases. Mild decrease in hypermetabolic activity associated with 1 cm left upper lobe pulmonary nodule. Diffuse bone metastases, with several in the thorax showing increased hypermetabolic activity consistent with mild progression of bone metastases.      02/01/2014 - 05/06/2015 Chemotherapy    XELODA 750 mg/m2 7 on/7 off       06/11/2014 PET scan    Partial metabolic response. Improving mediastinal lymphadenopathy, as above. Improving left adrenal metastasis. Prior hepatic metastasis is no longer evident.Improving multifocal osseous metastases, as above      05/04/2015 PET scan    Dominant finding is new widespread skeletal metastasis involvingthe axillary and appendicular skeleton. New lesions are not discretely seen  on the CT but recognized as new metabolic activity. New LEFT adrenal gland hypermetabolic metastasis. Foci of metabolic activity in the LEFT lung are felt to relate to atelectasis and/or infection. No clear evidence of pulmonary metastasis Moderate pleural LEFT pleural which could be sampled for cytology if clinically relevant.LEFT adrenal metastasis is actually an enlarged LEFT periaortic lymph node. This lymph node was enlarged and hypermetabolic to a greater degree FDG PET 01/31/2014 and subsequently improved and now has recurred on current exam  (05/04/2015) as a hypermetabolic enlarged LEFT periaortic lymph node.      05/04/2015 Progression    PET with progression of disease      05/06/2015 Treatment Plan Change    Megace      08/14/2015 PET scan    Suspect new hypermetabolic metastasis in posterior right hepatic lobe. Consider abdomen MRI without and with contrast for further evaluation. No significant change in probable malignant left pleural effusion and left lung atelectasis.No significant change in diffuse osseous metastatic disease. Stable hypermetabolic left abdominal paraaortic lymph node, consistent with metastatic disease      09/09/2015 - 03/07/2016 Chemotherapy    IBRANCE/FASLODEX . Ibrance at 100 mg secondary to grade 3 neutropenia      03/03/2016 PET scan    Interval overall progression of disease. Interval development of new and progression of pre-existing lesions in the liver, hypermetabolic on PET imaging, consistent with metastatic disease. Persistent hypermetabolic patchy opacity in the anterior left upper lobe with persistent chronic left pleural effusion. Diffuse bony metastases again noted with interval progression ofFDG uptake associated with the index bone lesions.      03/03/2016 Progression    PET demonstrates progression of disease.      03/11/2016 - 09/09/2016 Chemotherapy    Halaven D1, 8 every 21 days and with Neupogen support.       06/27/2016 PET scan    1. Overall mild improvement of multifocal metastatic disease. 2. Improvement in metabolic nodularity at the LEFT lung base. 3. Improvement in hepatic metastasis. 4. Overall improvement skeletal metastasis. Multiple foci of intense hypermetabolic activity remain within the skeleton remain.      09/27/2016 PET scan    Overall, mild progression is favored.  Status post right mastectomy with reconstruction.  Pulmonary metastases in the lingula, stable versus mildly increased. Associated central right lower lobe/ infrahilar nodule,  grossly unchanged.  New right hilar nodal metastasis.  Moderate loculated left pleural effusion. Associated left lower lobe atelectasis.  Multifocal hepatic metastases, more conspicuous than on the prior, stable versus mildly increased.  Multifocal osseous metastases throughout the visualized axial and appendicular skeleton, grossly unchanged.      09/27/2016 Progression    PET scan demonstrates progression of disease.      10/11/2016 Miscellaneous    Consultation with geriatric oncologist, Dr. Arabella Merles at [facility]      10/17/2016 Treatment Plan Change    Change in therapy to cyclophosphamide and methotrexate      HISTORY OF PRESENTING ILLNESS:  Debbie Reyes is a wonderful 76 y.o. female who returns for continued management of metastatic breast cancer.  She is doing well today; tolerating her oral cytoxan and methotrexate well. She has some fatigue, SOB, and weakness, but that started before her new chemo regimen. Denies nausea, vomiting, diarrhea, abdominal pain, chest pain, loss of appetite, or any other concerns.   MEDICAL HISTORY:  Past Medical History:  Diagnosis Date  . Bone metastases ([place]) 12/29/2015  . Cancer ([place])   . Low serum  vitamin B12 03/18/2016  . Metastatic breast cancer ([place]) 12/29/2015  Depression   anxiety   SURGICAL HISTORY: L1 bone metastases biopsy  Axillary lymph node biopsy   SOCIAL HISTORY: Social History   Social History  . Marital status: Widowed    Spouse name: N/A  . Number of children: N/A  . Years of education: N/A   Occupational History  . Not on file.   Social History Main Topics  . Smoking status: Never Smoker  . Smokeless tobacco: Never Used  . Alcohol use No  . Drug use: No  . Sexual activity: No   Other Topics Concern  . Not on file   Social History Narrative  . No narrative on file    FAMILY HISTORY: Patient denies family history of breast cancer, ovarian cancer or uterine cancer . Denies any family history of  blood clotting or bleeding disorders   ALLERGIES:  has No Known Allergies.  MEDICATIONS:  Current Outpatient Prescriptions  Medication Sig Dispense Refill  . albuterol (PROVENTIL HFA;VENTOLIN HFA) 108 (90 Base) MCG/ACT inhaler Inhale 2 puffs into the lungs 2 (two) times daily. 1 Inhaler 0  . aspirin EC 81 MG tablet Take 81 mg by mouth daily.    . cyclophosphamide (CYTOXAN) 50 MG capsule Take 1 capsule (50 mg total) by mouth daily. Give on an empty stomach 1 hour before or 2 hours after meals. 28 capsule 0  . denosumab (XGEVA) 120 MG/1.7ML SOLN Inject 120 mg into the skin once. Pt took on 10/13/11    . folic acid (FOLVITE) 1 MG tablet Take 1 tablet (1 mg total) by mouth daily. 30 tablet 2  . HYDROcodone-acetaminophen (NORCO/VICODIN) 5-325 MG tablet Take 1 tablet by mouth every 6 (six) hours as needed. For pain 100 tablet 0  . lidocaine-prilocaine (EMLA) cream Apply topically as needed. 1 each 2  . LORazepam (ATIVAN) 1 MG tablet Take 1 mg by mouth at bedtime as needed. For sleep    . Melatonin 5 MG TABS Take by mouth at bedtime as needed.    . methotrexate (RHEUMATREX) 2.5 MG tablet Take 2.5 mg BID on days 1 and 2 every week.  Caution:Chemotherapy. Protect from light. 16 tablet 0  . ondansetron (ZOFRAN) 8 MG tablet Take 1 tablet (8 mg total) by mouth 2 (two) times daily as needed (Nausea or vomiting). 30 tablet 1  . PARoxetine (PAXIL) 20 MG tablet Take 1 tablet (20 mg total) by mouth daily. 30 tablet 2  . prochlorperazine (COMPAZINE) 10 MG tablet Take 1 tablet (10 mg total) by mouth every 6 (six) hours as needed (Nausea or vomiting). 30 tablet 1  . propranolol (INDERAL) 10 MG tablet Take 1 tablet (10 mg total) by mouth 2 (two) times daily. 60 tablet 3  . Spacer/Aero-Holding Chambers (AEROCHAMBER MV) inhaler Use as instructed 1 each 0   No current facility-administered medications for this visit.     REVIEW OF SYSTEMS:   Review of Systems  Constitutional: Positive for malaise/fatigue.        No loss of appetite  HENT: Negative.   Eyes: Negative.   Respiratory: Positive for shortness of breath.   Cardiovascular: Negative.  Negative for chest pain.  Gastrointestinal: Negative.  Negative for abdominal pain, diarrhea, nausea and vomiting.  Genitourinary: Negative.   Musculoskeletal: Negative.   Skin: Negative.   Neurological: Positive for weakness.  Endo/Heme/Allergies: Negative.   Psychiatric/Behavioral: Negative.   All other systems reviewed and are negative. 14 point review of systems was performed  and is negative except as detailed under history of present illness and above  PHYSICAL EXAMINATION: ECOG PERFORMANCE STATUS: 1 - Symptomatic but completely ambulatory  Vitals:   10/31/16 1422  BP: 114/63  Pulse: 99  Resp: 18   Physical Exam  Constitutional: She is oriented to person, place, and time and well-developed, well-nourished, and in no distress.  HENT:  Head: Normocephalic and atraumatic.  Eyes: EOM are normal. Pupils are equal, round, and reactive to light. No scleral icterus.  Neck: Normal range of motion. Neck supple.  Cardiovascular: Normal rate, regular rhythm and normal heart sounds.   Pulmonary/Chest: Effort normal.  Abdominal: Soft. Bowel sounds are normal. She exhibits no distension and no mass. There is no tenderness. There is no rebound and no guarding.  Musculoskeletal: Normal range of motion.  Lymphadenopathy:    She has no cervical adenopathy.  Neurological: She is alert and oriented to person, place, and time. Gait normal.  Skin: Skin is warm and dry.  Psychiatric: Mood, memory, affect and judgment normal.  Nursing note and vitals reviewed.  LABORATORY DATA:  I have reviewed the data as listed  CBC Latest Ref Rng & Units 10/31/2016 10/17/2016 09/30/2016  WBC 4.0 - 10.5 K/uL 4.4 7.3 7.0  Hemoglobin 12.0 - 15.0 g/dL 10.7(L) 11.2(L) 11.8(L)  Hematocrit 36.0 - 46.0 % 32.9(L) 34.4(L) 36.6  Platelets 150 - 400 K/uL 218 239 271   CMP Latest Ref  Rng & Units 10/31/2016 10/17/2016 09/30/2016  Glucose 65 - 99 mg/dL 110(H) 124(H) 100(H)  BUN 6 - 20 mg/dL 16 19 25(H)  Creatinine 0.44 - 1.00 mg/dL 0.88 1.01(H) 0.89  Sodium 135 - 145 mmol/L 131(L) 134(L) 136  Potassium 3.5 - 5.1 mmol/L 3.8 3.9 3.9  Chloride 101 - 111 mmol/L 99(L) 102 102  CO2 22 - 32 mmol/L 24 21(L) 24  Calcium 8.9 - 10.3 mg/dL 9.1 9.5 9.6  Total Protein 6.5 - 8.1 g/dL 7.2 7.0 7.5  Total Bilirubin 0.3 - 1.2 mg/dL 0.5 0.4 0.3  Alkaline Phos 38 - 126 U/L 60 53 70  AST 15 - 41 U/L 32 31 30  ALT 14 - 54 U/L 13(L) 11(L) 15   RADIOGRAPHIC STUDIES: I have personally reviewed the radiological images as listed and agreed with the findings in the report.  PET Scan 09/27/2016 IMPRESSION: Overall, mild progression is favored.  Status post right mastectomy with reconstruction.  Pulmonary metastases in the lingula, stable versus mildly increased. Associated central right lower lobe/ infrahilar nodule, grossly unchanged.  New right hilar nodal metastasis.  Moderate loculated left pleural effusion. Associated left lower lobe atelectasis.  Multifocal hepatic metastases, more conspicuous than on the prior, stable versus mildly increased.  Multifocal osseous metastases throughout the visualized axial and appendicular skeleton, grossly unchanged.  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 Mauro Kaufmann Muss: cyclophosphamide and methotrexate in accordance with Annals of Oncology 13: 73-80, 2002.  "Low-dose oral methotrexate and cyclophosphamide and metastatic breast cancer: Antitumor activity and correlation with vascular endothelial growth factor levels."               Cyclophosphamide 50 mg/day             Methotrexate 2.5 mg BID on days 1, 2  every week PLAN:  Patient is tolerating her treatment very well.  Repeat scans after 3 months of oral cytoxan/methotrexate.   Labs reviewed with the patient in detail today.  She will return for follow up in 4 weeks.   All of the patients questions were answered to her apparent satisfaction. The patient knows to call the clinic with any problems, questions or concerns.  This document serves as a record of services personally performed by Twana First, MD. It was created on her behalf by Martinique Casey, a trained medical scribe. The creation of this record is based on the scribe's personal observations and the provider's statements to them. This document has been checked and approved by the attending provider.  I have reviewed the above documentation for accuracy and completeness and I agree with the above.  This note was electronically signed.

## 2016-11-01 NOTE — Progress Notes (Signed)
Pt started taking her cytoxan and methotrexate 10/24/2016.  She has been taking it as prescribed.  She has tolerated it well.  Consent signed today.

## 2016-11-02 MED ORDER — OCTREOTIDE ACETATE 30 MG IM KIT
PACK | INTRAMUSCULAR | Status: AC
  Filled 2016-11-02: qty 1

## 2016-11-14 ENCOUNTER — Other Ambulatory Visit (HOSPITAL_COMMUNITY): Payer: Medicare Other

## 2016-11-14 ENCOUNTER — Encounter (HOSPITAL_COMMUNITY): Payer: Self-pay

## 2016-11-14 ENCOUNTER — Other Ambulatory Visit (HOSPITAL_COMMUNITY): Payer: Self-pay | Admitting: Emergency Medicine

## 2016-11-14 ENCOUNTER — Encounter (HOSPITAL_COMMUNITY): Payer: Medicare Other

## 2016-11-14 ENCOUNTER — Encounter (HOSPITAL_COMMUNITY): Payer: Medicare Other | Attending: Hematology & Oncology

## 2016-11-14 ENCOUNTER — Ambulatory Visit (HOSPITAL_COMMUNITY): Payer: Medicare Other | Admitting: Oncology

## 2016-11-14 VITALS — BP 135/63 | HR 107 | Temp 97.9°F | Resp 18

## 2016-11-14 DIAGNOSIS — C50919 Malignant neoplasm of unspecified site of unspecified female breast: Secondary | ICD-10-CM

## 2016-11-14 DIAGNOSIS — C50912 Malignant neoplasm of unspecified site of left female breast: Secondary | ICD-10-CM | POA: Diagnosis not present

## 2016-11-14 DIAGNOSIS — C7951 Secondary malignant neoplasm of bone: Secondary | ICD-10-CM

## 2016-11-14 DIAGNOSIS — C799 Secondary malignant neoplasm of unspecified site: Secondary | ICD-10-CM | POA: Diagnosis not present

## 2016-11-14 LAB — CBC WITH DIFFERENTIAL/PLATELET
Basophils Absolute: 0 10*3/uL (ref 0.0–0.1)
Basophils Relative: 0 %
Eosinophils Absolute: 0.1 10*3/uL (ref 0.0–0.7)
Eosinophils Relative: 2 %
HCT: 33.5 % — ABNORMAL LOW (ref 36.0–46.0)
HEMOGLOBIN: 11.1 g/dL — AB (ref 12.0–15.0)
LYMPHS PCT: 15 %
Lymphs Abs: 0.7 10*3/uL (ref 0.7–4.0)
MCH: 31 pg (ref 26.0–34.0)
MCHC: 33.1 g/dL (ref 30.0–36.0)
MCV: 93.6 fL (ref 78.0–100.0)
Monocytes Absolute: 0.5 10*3/uL (ref 0.1–1.0)
Monocytes Relative: 11 %
NEUTROS ABS: 3.2 10*3/uL (ref 1.7–7.7)
NEUTROS PCT: 72 %
Platelets: 220 10*3/uL (ref 150–400)
RBC: 3.58 MIL/uL — ABNORMAL LOW (ref 3.87–5.11)
RDW: 19 % — AB (ref 11.5–15.5)
WBC: 4.5 10*3/uL (ref 4.0–10.5)

## 2016-11-14 LAB — COMPREHENSIVE METABOLIC PANEL
ALK PHOS: 59 U/L (ref 38–126)
ALT: 14 U/L (ref 14–54)
ANION GAP: 9 (ref 5–15)
AST: 28 U/L (ref 15–41)
Albumin: 4 g/dL (ref 3.5–5.0)
BILIRUBIN TOTAL: 0.7 mg/dL (ref 0.3–1.2)
BUN: 17 mg/dL (ref 6–20)
CALCIUM: 9.5 mg/dL (ref 8.9–10.3)
CO2: 24 mmol/L (ref 22–32)
Chloride: 103 mmol/L (ref 101–111)
Creatinine, Ser: 0.89 mg/dL (ref 0.44–1.00)
GFR calc non Af Amer: >60 mL/min (ref >60)
Glucose, Bld: 91 mg/dL (ref 65–99)
Potassium: 3.9 mmol/L (ref 3.5–5.1)
SODIUM: 136 mmol/L (ref 135–145)
TOTAL PROTEIN: 7.5 g/dL (ref 6.5–8.1)

## 2016-11-14 MED ORDER — DENOSUMAB 120 MG/1.7ML ~~LOC~~ SOLN
120.0000 mg | Freq: Once | SUBCUTANEOUS | Status: AC
  Administered 2016-11-14: 120 mg via SUBCUTANEOUS
  Filled 2016-11-14: qty 1.7

## 2016-11-14 MED ORDER — HYDROCODONE-ACETAMINOPHEN 5-325 MG PO TABS: 1.0000 | ORAL_TABLET | Freq: Four times a day (QID) | ORAL | 0 refills | Status: AC | PRN

## 2016-11-14 NOTE — Progress Notes (Signed)
Debbie Reyes presents today for injection per the provider's orders.  Xgeva administration without incident; see MAR for injection details.  Patient tolerated procedure well and without incident.  No questions or complaints noted at this time.  Discharged ambulatory.

## 2016-11-14 NOTE — Progress Notes (Signed)
Pt called for a refill on her hydrocodone

## 2016-11-14 NOTE — Patient Instructions (Signed)
[facility] at [facility] Discharge Instructions  RECOMMENDATIONS MADE BY THE CONSULTANT AND ANY TEST RESULTS WILL BE SENT TO YOUR REFERRING PHYSICIAN.  Xgeva injection today. Return as scheduled.   Thank you for choosing [facility] at [facility] to provide your oncology and hematology care.  To afford each patient quality time with our provider, please arrive at least 15 minutes before your scheduled appointment time.    If you have a lab appointment with the Cancer Center please come in thru the  Main Entrance and check in at the main information desk  You need to re-schedule your appointment should you arrive 10 or more minutes late.  We strive to give you quality time with our providers, and arriving late affects you and other patients whose appointments are after yours.  Also, if you no show three or more times for appointments you may be dismissed from the clinic at the providers discretion.     Again, thank you for choosing [facility].  Our hope is that these requests will decrease the amount of time that you wait before being seen by our physicians.       _____________________________________________________________  Should you have questions after your visit to [facility], please contact our office at [phone] between the hours of 8:30 a.m. and 4:30 p.m.  Voicemails left after 4:30 p.m. will not be returned until the following business day.  For prescription refill requests, have your pharmacy contact our office.       Resources For Cancer Patients and their Caregivers ? American Cancer Society: Can assist with transportation, wigs, general needs, runs Look Good Feel Better.        1-888-227-6333 ? Cancer Care: Provides financial assistance, online support groups, medication/co-pay assistance.  1-800-813-HOPE (4673) ? [facility] Assists [place] cancer patients and  their families through emotional , educational and financial support.  336-427-4357 ? [place] DSS Where to apply for food stamps, Medicaid and utility assistance. 336-342-1394 ? [facility]: Transportation to medical appointments. 336-347-2287 ? Social Security Administration: May apply for disability if have a Stage IV cancer. 336-342-7796 1-800-772-1213 ? [place] Aging, Disability and Transit Services: Assists with nutrition, care and transit needs. 336-349-2343  Cancer Center Support Programs: @10RELATIVEDAYS@ > Cancer Support Group  2nd Tuesday of the month 1pm-2pm, Journey Room  > Creative Journey  3rd Tuesday of the month 1130am-1pm, Journey Room  > Look Good Feel Better  1st Wednesday of the month 10am-12 noon, Journey Room (Call American Cancer Society to register 1-800-395-5775)   

## 2016-11-15 LAB — CANCER ANTIGEN 15-3: CAN 15 3: 414.1 U/mL — AB (ref 0.0–25.0)

## 2016-11-15 LAB — CANCER ANTIGEN 27.29: CA 27.29: 609.4 U/mL — AB (ref 0.0–38.6)

## 2016-11-21 ENCOUNTER — Other Ambulatory Visit (HOSPITAL_COMMUNITY): Payer: Self-pay | Admitting: Emergency Medicine

## 2016-11-21 DIAGNOSIS — C50919 Malignant neoplasm of unspecified site of unspecified female breast: Secondary | ICD-10-CM

## 2016-11-21 MED ORDER — CYCLOPHOSPHAMIDE 50 MG PO CAPS: 50.0000 mg | ORAL_CAPSULE | Freq: Every day | ORAL | 0 refills | Status: AC

## 2016-11-21 MED ORDER — METHOTREXATE 2.5 MG PO TABS: ORAL_TABLET | ORAL | 0 refills | Status: AC

## 2016-11-21 NOTE — Progress Notes (Signed)
Methotrexate and cytoxan refilled and labs printed and faxed to Dr Sonny Dandy at hospice

## 2016-11-29 ENCOUNTER — Encounter (HOSPITAL_COMMUNITY): Payer: Self-pay

## 2016-11-29 ENCOUNTER — Encounter (HOSPITAL_COMMUNITY): Payer: Medicare Other

## 2016-11-29 ENCOUNTER — Encounter (HOSPITAL_BASED_OUTPATIENT_CLINIC_OR_DEPARTMENT_OTHER): Payer: Medicare Other | Admitting: Oncology

## 2016-11-29 VITALS — BP 97/55 | HR 105 | Temp 97.6°F | Resp 20 | Wt 90.3 lb

## 2016-11-29 DIAGNOSIS — C50919 Malignant neoplasm of unspecified site of unspecified female breast: Secondary | ICD-10-CM

## 2016-11-29 DIAGNOSIS — J910 Malignant pleural effusion: Secondary | ICD-10-CM

## 2016-11-29 DIAGNOSIS — C799 Secondary malignant neoplasm of unspecified site: Secondary | ICD-10-CM | POA: Diagnosis not present

## 2016-11-29 DIAGNOSIS — C7972 Secondary malignant neoplasm of left adrenal gland: Secondary | ICD-10-CM

## 2016-11-29 DIAGNOSIS — E538 Deficiency of other specified B group vitamins: Secondary | ICD-10-CM | POA: Diagnosis not present

## 2016-11-29 DIAGNOSIS — C7951 Secondary malignant neoplasm of bone: Secondary | ICD-10-CM

## 2016-11-29 DIAGNOSIS — D61810 Antineoplastic chemotherapy induced pancytopenia: Secondary | ICD-10-CM

## 2016-11-29 LAB — CBC WITH DIFFERENTIAL/PLATELET
BASOS ABS: 0 10*3/uL (ref 0.0–0.1)
BASOS PCT: 1 %
EOS ABS: 0.2 10*3/uL (ref 0.0–0.7)
Eosinophils Relative: 4 %
HEMATOCRIT: 34.1 % — AB (ref 36.0–46.0)
HEMOGLOBIN: 11.2 g/dL — AB (ref 12.0–15.0)
Lymphocytes Relative: 12 %
Lymphs Abs: 0.5 10*3/uL — ABNORMAL LOW (ref 0.7–4.0)
MCH: 31.4 pg (ref 26.0–34.0)
MCHC: 32.8 g/dL (ref 30.0–36.0)
MCV: 95.5 fL (ref 78.0–100.0)
Monocytes Absolute: 0.6 10*3/uL (ref 0.1–1.0)
Monocytes Relative: 14 %
NEUTROS ABS: 2.9 10*3/uL (ref 1.7–7.7)
NEUTROS PCT: 69 %
Platelets: 203 10*3/uL (ref 150–400)
RBC: 3.57 MIL/uL — ABNORMAL LOW (ref 3.87–5.11)
RDW: 20.1 % — AB (ref 11.5–15.5)
WBC: 4.2 10*3/uL (ref 4.0–10.5)

## 2016-11-29 LAB — COMPREHENSIVE METABOLIC PANEL
ALBUMIN: 4.1 g/dL (ref 3.5–5.0)
ALT: 19 U/L (ref 14–54)
AST: 37 U/L (ref 15–41)
Alkaline Phosphatase: 59 U/L (ref 38–126)
Anion gap: 9 (ref 5–15)
BILIRUBIN TOTAL: 0.6 mg/dL (ref 0.3–1.2)
BUN: 15 mg/dL (ref 6–20)
CO2: 27 mmol/L (ref 22–32)
Calcium: 9.4 mg/dL (ref 8.9–10.3)
Chloride: 97 mmol/L — ABNORMAL LOW (ref 101–111)
Creatinine, Ser: 0.83 mg/dL (ref 0.44–1.00)
GFR calc non Af Amer: >60 mL/min (ref >60)
Glucose, Bld: 123 mg/dL — ABNORMAL HIGH (ref 65–99)
Potassium: 4 mmol/L (ref 3.5–5.1)
SODIUM: 133 mmol/L — AB (ref 135–145)
TOTAL PROTEIN: 7.2 g/dL (ref 6.5–8.1)

## 2016-11-29 NOTE — Patient Instructions (Signed)
[facility] at [facility] Discharge Instructions  RECOMMENDATIONS MADE BY THE CONSULTANT AND ANY TEST RESULTS WILL BE SENT TO YOUR REFERRING PHYSICIAN.  You saw Dr. Zhou today. See Amy at checkout for appointments.   Thank you for choosing [facility] at [facility] to provide your oncology and hematology care.  To afford each patient quality time with our provider, please arrive at least 15 minutes before your scheduled appointment time.    If you have a lab appointment with the Cancer Center please come in thru the  Main Entrance and check in at the main information desk  You need to re-schedule your appointment should you arrive 10 or more minutes late.  We strive to give you quality time with our providers, and arriving late affects you and other patients whose appointments are after yours.  Also, if you no show three or more times for appointments you may be dismissed from the clinic at the providers discretion.     Again, thank you for choosing [facility].  Our hope is that these requests will decrease the amount of time that you wait before being seen by our physicians.       _____________________________________________________________  Should you have questions after your visit to  Cancer Center, please contact our office at [phone] between the hours of 8:30 a.m. and 4:30 p.m.  Voicemails left after 4:30 p.m. will not be returned until the following business day.  For prescription refill requests, have your pharmacy contact our office.       Resources For Cancer Patients and their Caregivers ? American Cancer Society: Can assist with transportation, wigs, general needs, runs Look Good Feel Better.        1-888-227-6333 ? Cancer Care: Provides financial assistance, online support groups, medication/co-pay assistance.  1-800-813-HOPE (4673) ? [facility] Assists [place]  cancer patients and their families through emotional , educational and financial support.  336-427-4357 ? [place] DSS Where to apply for food stamps, Medicaid and utility assistance. 336-342-1394 ? [facility]: Transportation to medical appointments. 336-347-2287 ? Social Security Administration: May apply for disability if have a Stage IV cancer. 336-342-7796 1-800-772-1213 ? [place] Aging, Disability and Transit Services: Assists with nutrition, care and transit needs. 336-349-2343  Cancer Center Support Programs: @10RELATIVEDAYS@ > Cancer Support Group  2nd Tuesday of the month 1pm-2pm, Journey Room  > Creative Journey  3rd Tuesday of the month 1130am-1pm, Journey Room  > Look Good Feel Better  1st Wednesday of the month 10am-12 noon, Journey Room (Call American Cancer Society to register 1-800-395-5775)    

## 2016-11-29 NOTE — Progress Notes (Signed)
[name]    HEMATOLOGY/ONCOLOGY PROGRESS NOTE  Date of Service: 03/16/2016  Patient Care Team: [name]., MD as PCP - General (Unknown Physician Specialty)  CHIEF COMPLAINTS:  Continued management of metastatic breast cancer    Metastatic breast cancer ([place])   06/07/2005 Initial Biopsy    Biopsy L axillary nodal mass, no breast primary identified      08/11/2005 -  Chemotherapy    AC x 4, with 20% dose reduction on last cycle      11/21/2005 - 12/30/2005 Radiation Therapy    XRT 5040 cGY to L breast and L axillae with Dr. Isidore Moos      11/21/2005 - 12/31/2010 Anti-estrogen oral therapy    Arimidex X 5 years      01/20/2011 PET scan    Widespread hypermetabolic osseous metastatic disease. Index lesions are given above. No evidence of hypermetabolic metastatic disease involving the neck, chest, abdomen or pelvis.       01/25/2011 -  Radiation Therapy    1200 cGy to the left rib and 2050 cGy in 20 fractions to the T5-T10 vertebral bodies with a boost of 675 cGy in 3 fractions           01/25/2011 Treatment Plan Change    Faslodex initiated approximately on this date then stopped. Stopped due to failure to respond. Faslodex given in a dose of 250 mg only 3 doses (July 17, July 31, August 14) and a single 500 mg dose March 24, 2011          04/25/2011 - 10/13/2011 Chemotherapy    Taxol chemotherapy given weekly 2 with one week off. Stopped due to progression          10/22/2011 - 05/11/2012 Anti-estrogen oral therapy    Aromasin and everolimus initiating but stopped due to progression          11/11/2011 Initial Biopsy    L3 vertebral body biopsy ER+PR+      11/11/2011 Procedure    Status post vertebral body augmentation for painful pathologic compression fractures at T11 and at L1 using the balloon kyphoplasty technique. Status post vertebral body augmentation for painful pathologic compression fracture at T8 using the vertebroplasty technique.      12/09/2012 - 06/10/2013 Anti-estrogen oral therapy    Fareston      04/22/2013 PET scan    Hypermetabolic left upper lobe nodule, possibly metastatic. The possibility of primary bronchogenic carcinoma is also considered. Residual hypermetabolic metastatic disease involving the liver and bones. Small left pleural effusion, stable or minimally increased from 03/07/2013.      06/11/2013 - 01/08/2014 Chemotherapy    Abraxane      01/31/2014 PET scan    Progressive metastatic lymphadenopathy within the right supraclavicular region and mediastinum.New hypermetabolic left adrenal metastasis. Mixed response of hypermetabolic liver metastases. Mild decrease in hypermetabolic activity associated with 1 cm left upper lobe pulmonary nodule. Diffuse bone metastases, with several in the thorax showing increased hypermetabolic activity consistent with mild progression of bone metastases.      02/01/2014 - 05/06/2015 Chemotherapy    XELODA 750 mg/m2 7 on/7 off       06/11/2014 PET scan    Partial metabolic response. Improving mediastinal lymphadenopathy, as above. Improving left adrenal metastasis. Prior hepatic metastasis is no longer evident.Improving multifocal osseous metastases, as above      05/04/2015 PET scan    Dominant finding is new widespread skeletal metastasis involvingthe axillary and appendicular skeleton. New lesions are not discretely seen  on the CT but recognized as new metabolic activity. New LEFT adrenal gland hypermetabolic metastasis. Foci of metabolic activity in the LEFT lung are felt to relate to atelectasis and/or infection. No clear evidence of pulmonary metastasis Moderate pleural LEFT pleural which could be sampled for cytology if clinically relevant.LEFT adrenal metastasis is actually an enlarged LEFT periaortic lymph node. This lymph node was enlarged and hypermetabolic to a greater degree FDG PET 01/31/2014 and subsequently improved and now has recurred on current exam  (05/04/2015) as a hypermetabolic enlarged LEFT periaortic lymph node.      05/04/2015 Progression    PET with progression of disease      05/06/2015 Treatment Plan Change    Megace      08/14/2015 PET scan    Suspect new hypermetabolic metastasis in posterior right hepatic lobe. Consider abdomen MRI without and with contrast for further evaluation. No significant change in probable malignant left pleural effusion and left lung atelectasis.No significant change in diffuse osseous metastatic disease. Stable hypermetabolic left abdominal paraaortic lymph node, consistent with metastatic disease      09/09/2015 - 03/07/2016 Chemotherapy    IBRANCE/FASLODEX . Ibrance at 100 mg secondary to grade 3 neutropenia      03/03/2016 PET scan    Interval overall progression of disease. Interval development of new and progression of pre-existing lesions in the liver, hypermetabolic on PET imaging, consistent with metastatic disease. Persistent hypermetabolic patchy opacity in the anterior left upper lobe with persistent chronic left pleural effusion. Diffuse bony metastases again noted with interval progression ofFDG uptake associated with the index bone lesions.      03/03/2016 Progression    PET demonstrates progression of disease.      03/11/2016 - 09/09/2016 Chemotherapy    Halaven D1, 8 every 21 days and with Neupogen support.       06/27/2016 PET scan    1. Overall mild improvement of multifocal metastatic disease. 2. Improvement in metabolic nodularity at the LEFT lung base. 3. Improvement in hepatic metastasis. 4. Overall improvement skeletal metastasis. Multiple foci of intense hypermetabolic activity remain within the skeleton remain.      09/27/2016 PET scan    Overall, mild progression is favored.  Status post right mastectomy with reconstruction.  Pulmonary metastases in the lingula, stable versus mildly increased. Associated central right lower lobe/ infrahilar nodule,  grossly unchanged.  New right hilar nodal metastasis.  Moderate loculated left pleural effusion. Associated left lower lobe atelectasis.  Multifocal hepatic metastases, more conspicuous than on the prior, stable versus mildly increased.  Multifocal osseous metastases throughout the visualized axial and appendicular skeleton, grossly unchanged.      09/27/2016 Progression    PET scan demonstrates progression of disease.      10/11/2016 Miscellaneous    Consultation with geriatric oncologist, Dr. Arabella Merles at [facility]      10/17/2016 Treatment Plan Change    Change in therapy to cyclophosphamide and methotrexate      HISTORY OF PRESENTING ILLNESS:  Debbie Reyes is a wonderful 77 y.o. female who returns for continued management of metastatic breast cancer.  Patient is tolerating her oral cytoxan and methotrexate well. She is not experiencing any side effects from the oral chemotherapy. She does have chronic fatigue, exertional shortness of breath, generalized weakness which is unchanged. She states that her weight and appetite have been the same. Denies nausea, vomiting, diarrhea, abdominal pain, chest pain, loss of appetite, or any other concerns.   MEDICAL HISTORY:  Past Medical History:  Diagnosis Date  . Bone metastases ([place]) 12/29/2015  . Cancer ([place])   . Low serum vitamin B12 03/18/2016  . Metastatic breast cancer ([place]) 12/29/2015  Depression   anxiety   SURGICAL HISTORY: L1 bone metastases biopsy  Axillary lymph node biopsy   SOCIAL HISTORY: Social History   Social History  . Marital status: Widowed    Spouse name: N/A  . Number of children: N/A  . Years of education: N/A   Occupational History  . Not on file.   Social History Main Topics  . Smoking status: Never Smoker  . Smokeless tobacco: Never Used  . Alcohol use No  . Drug use: No  . Sexual activity: No   Other Topics Concern  . Not on file   Social History Narrative  . No narrative on file     FAMILY HISTORY: Patient denies family history of breast cancer, ovarian cancer or uterine cancer . Denies any family history of blood clotting or bleeding disorders   ALLERGIES:  has No Known Allergies.  MEDICATIONS:  Current Outpatient Prescriptions  Medication Sig Dispense Refill  . albuterol (PROVENTIL HFA;VENTOLIN HFA) 108 (90 Base) MCG/ACT inhaler Inhale 2 puffs into the lungs 2 (two) times daily. 1 Inhaler 0  . aspirin EC 81 MG tablet Take 81 mg by mouth daily.    . cyclophosphamide (CYTOXAN) 50 MG capsule Take 1 capsule (50 mg total) by mouth daily. Give on an empty stomach 1 hour before or 2 hours after meals. 28 capsule 0  . denosumab (XGEVA) 120 MG/1.7ML SOLN Inject 120 mg into the skin once. Pt took on 10/13/11    . folic acid (FOLVITE) 1 MG tablet Take 1 tablet (1 mg total) by mouth daily. 30 tablet 2  . HYDROcodone-acetaminophen (NORCO/VICODIN) 5-325 MG tablet Take 1 tablet by mouth every 6 (six) hours as needed. For pain 100 tablet 0  . lidocaine-prilocaine (EMLA) cream Apply topically as needed. 1 each 2  . LORazepam (ATIVAN) 1 MG tablet Take 1 mg by mouth at bedtime as needed. For sleep    . Melatonin 5 MG TABS Take by mouth at bedtime as needed.    . methotrexate (RHEUMATREX) 2.5 MG tablet Take 2.5 mg BID on days 1 and 2 every week.  Caution:Chemotherapy. Protect from light. 16 tablet 0  . ondansetron (ZOFRAN) 8 MG tablet Take 1 tablet (8 mg total) by mouth 2 (two) times daily as needed (Nausea or vomiting). 30 tablet 1  . PARoxetine (PAXIL) 20 MG tablet Take 1 tablet (20 mg total) by mouth daily. 30 tablet 2  . prochlorperazine (COMPAZINE) 10 MG tablet Take 1 tablet (10 mg total) by mouth every 6 (six) hours as needed (Nausea or vomiting). 30 tablet 1  . propranolol (INDERAL) 10 MG tablet Take 1 tablet (10 mg total) by mouth 2 (two) times daily. 60 tablet 3  . Spacer/Aero-Holding Chambers (AEROCHAMBER MV) inhaler Use as instructed 1 each 0   No current  facility-administered medications for this visit.     REVIEW OF SYSTEMS:   Review of Systems  Constitutional: Positive for malaise/fatigue.       No loss of appetite  HENT: Negative.   Eyes: Negative.   Respiratory: Positive for shortness of breath.   Cardiovascular: Negative.  Negative for chest pain.  Gastrointestinal: Negative.  Negative for abdominal pain, diarrhea, nausea and vomiting.  Genitourinary: Negative.   Musculoskeletal: Positive for back pain.  Skin: Negative.   Neurological: Positive for weakness.  Endo/Heme/Allergies: Negative.  Psychiatric/Behavioral: Negative.   All other systems reviewed and are negative. 14 point review of systems was performed and is negative except as detailed under history of present illness and above  PHYSICAL EXAMINATION: ECOG PERFORMANCE STATUS: 1 - Symptomatic but completely ambulatory  Vitals:   11/29/16 1406  BP: (!) 97/55  Pulse: (!) 105  Resp: 20  Temp: 97.6 F (36.4 C)   Physical Exam  Constitutional: She is oriented to person, place, and time and well-developed, well-nourished, and in no distress. No distress.  HENT:  Head: Normocephalic and atraumatic.  Mouth/Throat: No oropharyngeal exudate.  Eyes: Conjunctivae and EOM are normal. Pupils are equal, round, and reactive to light. No scleral icterus.  Neck: Normal range of motion. Neck supple. No JVD present.  Cardiovascular: Normal rate, regular rhythm and normal heart sounds.  Exam reveals no gallop and no friction rub.   No murmur heard. Pulmonary/Chest: Effort normal and breath sounds normal. No respiratory distress. She has no wheezes. She has no rales.  Abdominal: Soft. Bowel sounds are normal. She exhibits no distension and no mass. There is no tenderness. There is no rebound and no guarding.  Musculoskeletal: Normal range of motion. She exhibits no edema or tenderness.  Lymphadenopathy:    She has no cervical adenopathy.  Neurological: She is alert and oriented  to person, place, and time. No cranial nerve deficit. Gait normal.  Skin: Skin is warm and dry. No rash noted. No erythema. No pallor.  Psychiatric: Mood, memory, affect and judgment normal.  Nursing note and vitals reviewed.  LABORATORY DATA:  I have reviewed the data as listed  CBC Latest Ref Rng & Units 11/29/2016 11/14/2016 10/31/2016  WBC 4.0 - 10.5 K/uL 4.2 4.5 4.4  Hemoglobin 12.0 - 15.0 g/dL 11.2(L) 11.1(L) 10.7(L)  Hematocrit 36.0 - 46.0 % 34.1(L) 33.5(L) 32.9(L)  Platelets 150 - 400 K/uL 203 220 218   CMP Latest Ref Rng & Units 11/29/2016 11/14/2016 10/31/2016  Glucose 65 - 99 mg/dL 123(H) 91 110(H)  BUN 6 - 20 mg/dL 15 17 16   Creatinine 0.44 - 1.00 mg/dL 0.83 0.89 0.88  Sodium 135 - 145 mmol/L 133(L) 136 131(L)  Potassium 3.5 - 5.1 mmol/L 4.0 3.9 3.8  Chloride 101 - 111 mmol/L 97(L) 103 99(L)  CO2 22 - 32 mmol/L 27 24 24   Calcium 8.9 - 10.3 mg/dL 9.4 9.5 9.1  Total Protein 6.5 - 8.1 g/dL 7.2 7.5 7.2  Total Bilirubin 0.3 - 1.2 mg/dL 0.6 0.7 0.5  Alkaline Phos 38 - 126 U/L 59 59 60  AST 15 - 41 U/L 37 28 32  ALT 14 - 54 U/L 19 14 13(L)   RADIOGRAPHIC STUDIES: I have personally reviewed the radiological images as listed and agreed with the findings in the report.  PET Scan 09/27/2016 IMPRESSION: Overall, mild progression is favored.  Status post right mastectomy with reconstruction.  Pulmonary metastases in the lingula, stable versus mildly increased. Associated central right lower lobe/ infrahilar nodule, grossly unchanged.  New right hilar nodal metastasis.  Moderate loculated left pleural effusion. Associated left lower lobe atelectasis.  Multifocal hepatic metastases, more conspicuous than on the prior, stable versus mildly increased.  Multifocal osseous metastases throughout the visualized axial and appendicular skeleton, grossly unchanged.  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 Mauro Kaufmann Muss: cyclophosphamide and methotrexate in accordance with Annals of Oncology 13: 73-80, 2002.  "Low-dose oral methotrexate and cyclophosphamide and metastatic breast cancer: Antitumor activity and correlation with vascular endothelial growth factor levels."              Cyclophosphamide 50 mg/day             Methotrexate 2.5 mg BID on days 1 and day 2 every week  PLAN:  Patient is tolerating her oral treatment very well. Continue oral Cytoxan and methotrexate.  Labs from today reviewed.  Repeat scans after 3 months of oral cytoxan/methotrexate.   Patient states that she will be transferring her care to [facility] and even since it is closer to her home. She will be getting all her medical records today. I have also given her a copy of the article for dosing of Cytoxan and methotrexate as recommended by Dr. Alinda Money, so that she can show it to her new oncologist.  RTC PRN.   All of the patients questions were answered to her apparent satisfaction. The patient knows to call the clinic with any problems, questions or concerns.  This document serves as a record of services personally performed by Debbie First, MD. It was created on her behalf by Martinique Casey, a trained medical scribe. The creation of this record is based on the scribe's personal observations and the provider's statements to them. This document has been checked and approved by the attending provider.  I have reviewed the above documentation for accuracy and completeness and I agree with the above.  This note was electronically signed.

## 2016-12-12 ENCOUNTER — Other Ambulatory Visit (HOSPITAL_COMMUNITY): Payer: Medicare Other

## 2016-12-12 ENCOUNTER — Ambulatory Visit (HOSPITAL_COMMUNITY): Payer: Medicare Other

## 2016-12-13 ENCOUNTER — Other Ambulatory Visit (HOSPITAL_COMMUNITY): Payer: Self-pay | Admitting: Emergency Medicine

## 2016-12-13 DIAGNOSIS — C7951 Secondary malignant neoplasm of bone: Secondary | ICD-10-CM

## 2016-12-13 DIAGNOSIS — C50919 Malignant neoplasm of unspecified site of unspecified female breast: Secondary | ICD-10-CM

## 2016-12-13 NOTE — Progress Notes (Signed)
Pt called and needed lab work ordered since she has it every 2 weeks.  She has had her care transferred to [facility] but her appt is in a few days.  They told her that we had to resume her care until she walked through the doors at their clinic.  Labs ordered for tomorrow at 11.  Pt verbalized understanding.

## 2016-12-14 ENCOUNTER — Encounter (HOSPITAL_COMMUNITY): Payer: Medicare Other | Attending: Oncology

## 2016-12-14 DIAGNOSIS — C50919 Malignant neoplasm of unspecified site of unspecified female breast: Secondary | ICD-10-CM

## 2016-12-14 DIAGNOSIS — C7951 Secondary malignant neoplasm of bone: Secondary | ICD-10-CM | POA: Diagnosis not present

## 2016-12-14 DIAGNOSIS — C50612 Malignant neoplasm of axillary tail of left female breast: Secondary | ICD-10-CM | POA: Diagnosis not present

## 2016-12-14 LAB — COMPREHENSIVE METABOLIC PANEL
ALK PHOS: 61 U/L (ref 38–126)
ALT: 20 U/L (ref 14–54)
ANION GAP: 9 (ref 5–15)
AST: 34 U/L (ref 15–41)
Albumin: 3.7 g/dL (ref 3.5–5.0)
BUN: 12 mg/dL (ref 6–20)
CALCIUM: 9.2 mg/dL (ref 8.9–10.3)
CHLORIDE: 104 mmol/L (ref 101–111)
CO2: 25 mmol/L (ref 22–32)
CREATININE: 0.79 mg/dL (ref 0.44–1.00)
GFR calc Af Amer: >60 mL/min (ref >60)
Glucose, Bld: 110 mg/dL — ABNORMAL HIGH (ref 65–99)
Potassium: 3.6 mmol/L (ref 3.5–5.1)
SODIUM: 138 mmol/L (ref 135–145)
Total Bilirubin: 0.7 mg/dL (ref 0.3–1.2)
Total Protein: 7 g/dL (ref 6.5–8.1)

## 2016-12-14 LAB — CBC WITH DIFFERENTIAL/PLATELET
Basophils Absolute: 0 10*3/uL (ref 0.0–0.1)
Basophils Relative: 1 %
EOS ABS: 0.2 10*3/uL (ref 0.0–0.7)
EOS PCT: 6 %
HCT: 30.5 % — ABNORMAL LOW (ref 36.0–46.0)
Hemoglobin: 10.1 g/dL — ABNORMAL LOW (ref 12.0–15.0)
Lymphocytes Relative: 14 %
Lymphs Abs: 0.5 10*3/uL — ABNORMAL LOW (ref 0.7–4.0)
MCH: 32 pg (ref 26.0–34.0)
MCHC: 33.1 g/dL (ref 30.0–36.0)
MCV: 96.5 fL (ref 78.0–100.0)
MONO ABS: 0.5 10*3/uL (ref 0.1–1.0)
Monocytes Relative: 16 %
NEUTROS ABS: 2.2 10*3/uL (ref 1.7–7.7)
Neutrophils Relative %: 63 %
PLATELETS: 184 10*3/uL (ref 150–400)
RBC: 3.16 MIL/uL — ABNORMAL LOW (ref 3.87–5.11)
RDW: 21.7 % — ABNORMAL HIGH (ref 11.5–15.5)
WBC: 3.4 10*3/uL — ABNORMAL LOW (ref 4.0–10.5)

## 2016-12-15 LAB — CANCER ANTIGEN 19-9: CA 19-9: 15 U/mL (ref 0–35)

## 2016-12-15 LAB — CANCER ANTIGEN 27.29: CA 27.29: 414.5 U/mL — ABNORMAL HIGH (ref 0.0–38.6)

## 2016-12-16 DIAGNOSIS — D649 Anemia, unspecified: Secondary | ICD-10-CM | POA: Diagnosis not present

## 2016-12-16 DIAGNOSIS — Z170 Estrogen receptor positive status [ER+]: Secondary | ICD-10-CM | POA: Diagnosis not present

## 2016-12-16 DIAGNOSIS — C50912 Malignant neoplasm of unspecified site of left female breast: Secondary | ICD-10-CM | POA: Diagnosis not present

## 2016-12-16 DIAGNOSIS — Z79899 Other long term (current) drug therapy: Secondary | ICD-10-CM | POA: Diagnosis not present

## 2016-12-16 DIAGNOSIS — Z7982 Long term (current) use of aspirin: Secondary | ICD-10-CM | POA: Diagnosis not present

## 2016-12-16 DIAGNOSIS — Z8249 Family history of ischemic heart disease and other diseases of the circulatory system: Secondary | ICD-10-CM | POA: Diagnosis not present

## 2016-12-16 DIAGNOSIS — R634 Abnormal weight loss: Secondary | ICD-10-CM | POA: Diagnosis not present

## 2016-12-16 DIAGNOSIS — Z79818 Long term (current) use of other agents affecting estrogen receptors and estrogen levels: Secondary | ICD-10-CM | POA: Diagnosis not present

## 2016-12-16 DIAGNOSIS — E538 Deficiency of other specified B group vitamins: Secondary | ICD-10-CM | POA: Diagnosis not present

## 2016-12-16 DIAGNOSIS — C50919 Malignant neoplasm of unspecified site of unspecified female breast: Secondary | ICD-10-CM | POA: Diagnosis not present

## 2016-12-16 DIAGNOSIS — C7951 Secondary malignant neoplasm of bone: Secondary | ICD-10-CM | POA: Diagnosis not present

## 2016-12-16 DIAGNOSIS — F329 Major depressive disorder, single episode, unspecified: Secondary | ICD-10-CM | POA: Diagnosis not present

## 2016-12-16 DIAGNOSIS — I10 Essential (primary) hypertension: Secondary | ICD-10-CM | POA: Diagnosis not present

## 2016-12-20 DIAGNOSIS — C50919 Malignant neoplasm of unspecified site of unspecified female breast: Secondary | ICD-10-CM | POA: Diagnosis not present

## 2016-12-20 DIAGNOSIS — D649 Anemia, unspecified: Secondary | ICD-10-CM | POA: Diagnosis not present

## 2016-12-20 DIAGNOSIS — C7951 Secondary malignant neoplasm of bone: Secondary | ICD-10-CM | POA: Diagnosis not present

## 2016-12-20 DIAGNOSIS — E538 Deficiency of other specified B group vitamins: Secondary | ICD-10-CM | POA: Diagnosis not present

## 2016-12-22 DIAGNOSIS — D649 Anemia, unspecified: Secondary | ICD-10-CM | POA: Diagnosis not present

## 2016-12-22 DIAGNOSIS — T451X5A Adverse effect of antineoplastic and immunosuppressive drugs, initial encounter: Secondary | ICD-10-CM | POA: Diagnosis not present

## 2016-12-22 DIAGNOSIS — C7951 Secondary malignant neoplasm of bone: Secondary | ICD-10-CM | POA: Diagnosis not present

## 2016-12-22 DIAGNOSIS — Z79891 Long term (current) use of opiate analgesic: Secondary | ICD-10-CM | POA: Diagnosis not present

## 2016-12-22 DIAGNOSIS — D701 Agranulocytosis secondary to cancer chemotherapy: Secondary | ICD-10-CM | POA: Diagnosis not present

## 2016-12-22 DIAGNOSIS — I10 Essential (primary) hypertension: Secondary | ICD-10-CM | POA: Diagnosis not present

## 2016-12-22 DIAGNOSIS — E538 Deficiency of other specified B group vitamins: Secondary | ICD-10-CM | POA: Diagnosis not present

## 2016-12-22 DIAGNOSIS — Z923 Personal history of irradiation: Secondary | ICD-10-CM | POA: Diagnosis not present

## 2016-12-22 DIAGNOSIS — Z79818 Long term (current) use of other agents affecting estrogen receptors and estrogen levels: Secondary | ICD-10-CM | POA: Diagnosis not present

## 2016-12-22 DIAGNOSIS — R202 Paresthesia of skin: Secondary | ICD-10-CM | POA: Diagnosis not present

## 2016-12-22 DIAGNOSIS — Z170 Estrogen receptor positive status [ER+]: Secondary | ICD-10-CM | POA: Diagnosis not present

## 2016-12-22 DIAGNOSIS — Z5112 Encounter for antineoplastic immunotherapy: Secondary | ICD-10-CM | POA: Diagnosis not present

## 2016-12-22 DIAGNOSIS — R200 Anesthesia of skin: Secondary | ICD-10-CM | POA: Diagnosis not present

## 2016-12-22 DIAGNOSIS — C787 Secondary malignant neoplasm of liver and intrahepatic bile duct: Secondary | ICD-10-CM | POA: Diagnosis not present

## 2016-12-22 DIAGNOSIS — Z7982 Long term (current) use of aspirin: Secondary | ICD-10-CM | POA: Diagnosis not present

## 2016-12-22 DIAGNOSIS — J90 Pleural effusion, not elsewhere classified: Secondary | ICD-10-CM | POA: Diagnosis not present

## 2016-12-22 DIAGNOSIS — Z79899 Other long term (current) drug therapy: Secondary | ICD-10-CM | POA: Diagnosis not present

## 2016-12-22 DIAGNOSIS — C50919 Malignant neoplasm of unspecified site of unspecified female breast: Secondary | ICD-10-CM | POA: Diagnosis not present

## 2016-12-22 DIAGNOSIS — C7802 Secondary malignant neoplasm of left lung: Secondary | ICD-10-CM | POA: Diagnosis not present

## 2016-12-22 DIAGNOSIS — R634 Abnormal weight loss: Secondary | ICD-10-CM | POA: Diagnosis not present

## 2016-12-22 DIAGNOSIS — F329 Major depressive disorder, single episode, unspecified: Secondary | ICD-10-CM | POA: Diagnosis not present

## 2016-12-26 DIAGNOSIS — C50919 Malignant neoplasm of unspecified site of unspecified female breast: Secondary | ICD-10-CM | POA: Diagnosis not present

## 2016-12-26 DIAGNOSIS — T451X5A Adverse effect of antineoplastic and immunosuppressive drugs, initial encounter: Secondary | ICD-10-CM | POA: Diagnosis not present

## 2016-12-26 DIAGNOSIS — C7951 Secondary malignant neoplasm of bone: Secondary | ICD-10-CM | POA: Diagnosis not present

## 2016-12-26 DIAGNOSIS — D701 Agranulocytosis secondary to cancer chemotherapy: Secondary | ICD-10-CM | POA: Diagnosis not present

## 2016-12-27 DIAGNOSIS — D702 Other drug-induced agranulocytosis: Secondary | ICD-10-CM | POA: Diagnosis not present

## 2016-12-27 DIAGNOSIS — C50919 Malignant neoplasm of unspecified site of unspecified female breast: Secondary | ICD-10-CM | POA: Diagnosis not present

## 2016-12-27 DIAGNOSIS — C7951 Secondary malignant neoplasm of bone: Secondary | ICD-10-CM | POA: Diagnosis not present

## 2016-12-27 DIAGNOSIS — Z170 Estrogen receptor positive status [ER+]: Secondary | ICD-10-CM | POA: Diagnosis not present

## 2016-12-27 DIAGNOSIS — C50612 Malignant neoplasm of axillary tail of left female breast: Secondary | ICD-10-CM | POA: Diagnosis not present

## 2016-12-28 ENCOUNTER — Other Ambulatory Visit (HOSPITAL_COMMUNITY): Payer: Self-pay | Admitting: Oncology

## 2016-12-28 DIAGNOSIS — C50919 Malignant neoplasm of unspecified site of unspecified female breast: Secondary | ICD-10-CM

## 2017-01-18 ENCOUNTER — Encounter (HOSPITAL_COMMUNITY)
Admission: RE | Admit: 2017-01-18 | Discharge: 2017-01-18 | Disposition: A | Discharge status: Home / Self Care | Payer: Medicare Other | Source: Ambulatory Visit | Attending: Oncology | Admitting: Oncology

## 2017-01-18 DIAGNOSIS — C50919 Malignant neoplasm of unspecified site of unspecified female breast: Secondary | ICD-10-CM | POA: Diagnosis not present

## 2017-01-18 LAB — GLUCOSE, CAPILLARY: Glucose-Capillary: 107 mg/dL — ABNORMAL HIGH (ref 65–99)

## 2017-01-18 MED ORDER — FLUDEOXYGLUCOSE F - 18 (FDG) INJECTION
5.0100 | Freq: Once | INTRAVENOUS | Status: AC | PRN
  Administered 2017-01-18: 5.01 via INTRAVENOUS

## 2017-01-19 DIAGNOSIS — C50919 Malignant neoplasm of unspecified site of unspecified female breast: Secondary | ICD-10-CM | POA: Diagnosis not present

## 2017-01-19 DIAGNOSIS — C7951 Secondary malignant neoplasm of bone: Secondary | ICD-10-CM | POA: Diagnosis not present

## 2017-01-23 DIAGNOSIS — C7972 Secondary malignant neoplasm of left adrenal gland: Secondary | ICD-10-CM | POA: Diagnosis not present

## 2017-01-23 DIAGNOSIS — Z9221 Personal history of antineoplastic chemotherapy: Secondary | ICD-10-CM | POA: Diagnosis not present

## 2017-01-23 DIAGNOSIS — E538 Deficiency of other specified B group vitamins: Secondary | ICD-10-CM | POA: Diagnosis not present

## 2017-01-23 DIAGNOSIS — Z170 Estrogen receptor positive status [ER+]: Secondary | ICD-10-CM | POA: Diagnosis not present

## 2017-01-23 DIAGNOSIS — Z79899 Other long term (current) drug therapy: Secondary | ICD-10-CM | POA: Diagnosis not present

## 2017-01-23 DIAGNOSIS — C50919 Malignant neoplasm of unspecified site of unspecified female breast: Secondary | ICD-10-CM | POA: Diagnosis not present

## 2017-01-23 DIAGNOSIS — C787 Secondary malignant neoplasm of liver and intrahepatic bile duct: Secondary | ICD-10-CM | POA: Diagnosis not present

## 2017-01-23 DIAGNOSIS — D709 Neutropenia, unspecified: Secondary | ICD-10-CM | POA: Diagnosis not present

## 2017-01-23 DIAGNOSIS — J90 Pleural effusion, not elsewhere classified: Secondary | ICD-10-CM | POA: Diagnosis not present

## 2017-01-23 DIAGNOSIS — D638 Anemia in other chronic diseases classified elsewhere: Secondary | ICD-10-CM | POA: Diagnosis not present

## 2017-01-23 DIAGNOSIS — Z923 Personal history of irradiation: Secondary | ICD-10-CM | POA: Diagnosis not present

## 2017-01-23 DIAGNOSIS — C7951 Secondary malignant neoplasm of bone: Secondary | ICD-10-CM | POA: Diagnosis not present

## 2017-01-23 DIAGNOSIS — Z9889 Other specified postprocedural states: Secondary | ICD-10-CM | POA: Diagnosis not present

## 2017-01-23 DIAGNOSIS — F329 Major depressive disorder, single episode, unspecified: Secondary | ICD-10-CM | POA: Diagnosis not present

## 2017-01-23 DIAGNOSIS — Z79818 Long term (current) use of other agents affecting estrogen receptors and estrogen levels: Secondary | ICD-10-CM | POA: Diagnosis not present

## 2017-01-23 DIAGNOSIS — Z7982 Long term (current) use of aspirin: Secondary | ICD-10-CM | POA: Diagnosis not present

## 2017-01-23 DIAGNOSIS — C7800 Secondary malignant neoplasm of unspecified lung: Secondary | ICD-10-CM | POA: Diagnosis not present

## 2017-01-23 DIAGNOSIS — I10 Essential (primary) hypertension: Secondary | ICD-10-CM | POA: Diagnosis not present

## 2017-02-16 DIAGNOSIS — C7951 Secondary malignant neoplasm of bone: Secondary | ICD-10-CM | POA: Diagnosis not present

## 2017-02-16 DIAGNOSIS — C50912 Malignant neoplasm of unspecified site of left female breast: Secondary | ICD-10-CM | POA: Diagnosis not present

## 2017-02-16 DIAGNOSIS — Z170 Estrogen receptor positive status [ER+]: Secondary | ICD-10-CM | POA: Diagnosis not present

## 2017-02-20 DIAGNOSIS — C50919 Malignant neoplasm of unspecified site of unspecified female breast: Secondary | ICD-10-CM | POA: Diagnosis not present

## 2017-02-20 DIAGNOSIS — C7951 Secondary malignant neoplasm of bone: Secondary | ICD-10-CM | POA: Diagnosis not present

## 2017-02-20 DIAGNOSIS — Z5112 Encounter for antineoplastic immunotherapy: Secondary | ICD-10-CM | POA: Diagnosis not present

## 2017-03-17 DIAGNOSIS — C50919 Malignant neoplasm of unspecified site of unspecified female breast: Secondary | ICD-10-CM | POA: Diagnosis not present

## 2017-03-17 DIAGNOSIS — C7951 Secondary malignant neoplasm of bone: Secondary | ICD-10-CM | POA: Diagnosis not present

## 2017-03-17 DIAGNOSIS — Z170 Estrogen receptor positive status [ER+]: Secondary | ICD-10-CM | POA: Diagnosis not present

## 2017-03-17 DIAGNOSIS — C50912 Malignant neoplasm of unspecified site of left female breast: Secondary | ICD-10-CM | POA: Diagnosis not present

## 2017-03-21 DIAGNOSIS — C7951 Secondary malignant neoplasm of bone: Secondary | ICD-10-CM | POA: Diagnosis not present

## 2017-03-21 DIAGNOSIS — Z79899 Other long term (current) drug therapy: Secondary | ICD-10-CM | POA: Diagnosis not present

## 2017-03-21 DIAGNOSIS — C50919 Malignant neoplasm of unspecified site of unspecified female breast: Secondary | ICD-10-CM | POA: Diagnosis not present

## 2017-03-30 DIAGNOSIS — C770 Secondary and unspecified malignant neoplasm of lymph nodes of head, face and neck: Secondary | ICD-10-CM | POA: Diagnosis not present

## 2017-03-30 DIAGNOSIS — I10 Essential (primary) hypertension: Secondary | ICD-10-CM | POA: Diagnosis not present

## 2017-03-30 DIAGNOSIS — C50919 Malignant neoplasm of unspecified site of unspecified female breast: Secondary | ICD-10-CM | POA: Diagnosis not present

## 2017-03-30 DIAGNOSIS — C771 Secondary and unspecified malignant neoplasm of intrathoracic lymph nodes: Secondary | ICD-10-CM | POA: Diagnosis not present

## 2017-03-30 DIAGNOSIS — C7889 Secondary malignant neoplasm of other digestive organs: Secondary | ICD-10-CM | POA: Diagnosis not present

## 2017-03-30 DIAGNOSIS — C772 Secondary and unspecified malignant neoplasm of intra-abdominal lymph nodes: Secondary | ICD-10-CM | POA: Diagnosis not present

## 2017-03-30 DIAGNOSIS — C7800 Secondary malignant neoplasm of unspecified lung: Secondary | ICD-10-CM | POA: Diagnosis not present

## 2017-03-30 DIAGNOSIS — F329 Major depressive disorder, single episode, unspecified: Secondary | ICD-10-CM | POA: Diagnosis not present

## 2017-03-30 DIAGNOSIS — R634 Abnormal weight loss: Secondary | ICD-10-CM | POA: Diagnosis not present

## 2017-03-30 DIAGNOSIS — C50912 Malignant neoplasm of unspecified site of left female breast: Secondary | ICD-10-CM | POA: Diagnosis not present

## 2017-03-30 DIAGNOSIS — Z7982 Long term (current) use of aspirin: Secondary | ICD-10-CM | POA: Diagnosis not present

## 2017-03-30 DIAGNOSIS — Z923 Personal history of irradiation: Secondary | ICD-10-CM | POA: Diagnosis not present

## 2017-03-30 DIAGNOSIS — J90 Pleural effusion, not elsewhere classified: Secondary | ICD-10-CM | POA: Diagnosis not present

## 2017-03-30 DIAGNOSIS — J9811 Atelectasis: Secondary | ICD-10-CM | POA: Diagnosis not present

## 2017-03-30 DIAGNOSIS — D649 Anemia, unspecified: Secondary | ICD-10-CM | POA: Diagnosis not present

## 2017-03-30 DIAGNOSIS — C7951 Secondary malignant neoplasm of bone: Secondary | ICD-10-CM | POA: Diagnosis not present

## 2017-03-30 DIAGNOSIS — D709 Neutropenia, unspecified: Secondary | ICD-10-CM | POA: Diagnosis not present

## 2017-03-30 DIAGNOSIS — C787 Secondary malignant neoplasm of liver and intrahepatic bile duct: Secondary | ICD-10-CM | POA: Diagnosis not present

## 2017-03-30 DIAGNOSIS — E538 Deficiency of other specified B group vitamins: Secondary | ICD-10-CM | POA: Diagnosis not present

## 2017-03-30 DIAGNOSIS — R202 Paresthesia of skin: Secondary | ICD-10-CM | POA: Diagnosis not present

## 2017-03-30 DIAGNOSIS — Z170 Estrogen receptor positive status [ER+]: Secondary | ICD-10-CM | POA: Diagnosis not present

## 2017-04-04 ENCOUNTER — Other Ambulatory Visit (HOSPITAL_COMMUNITY): Payer: Self-pay | Admitting: Hematology & Oncology

## 2017-04-13 DIAGNOSIS — C7951 Secondary malignant neoplasm of bone: Secondary | ICD-10-CM | POA: Diagnosis not present

## 2017-04-13 DIAGNOSIS — C50912 Malignant neoplasm of unspecified site of left female breast: Secondary | ICD-10-CM | POA: Diagnosis not present

## 2017-04-13 DIAGNOSIS — C50919 Malignant neoplasm of unspecified site of unspecified female breast: Secondary | ICD-10-CM | POA: Diagnosis not present

## 2017-04-13 DIAGNOSIS — T451X5D Adverse effect of antineoplastic and immunosuppressive drugs, subsequent encounter: Secondary | ICD-10-CM | POA: Diagnosis not present

## 2017-04-13 DIAGNOSIS — I10 Essential (primary) hypertension: Secondary | ICD-10-CM | POA: Diagnosis not present

## 2017-04-13 DIAGNOSIS — Z170 Estrogen receptor positive status [ER+]: Secondary | ICD-10-CM | POA: Diagnosis not present

## 2017-04-13 DIAGNOSIS — F329 Major depressive disorder, single episode, unspecified: Secondary | ICD-10-CM | POA: Diagnosis not present

## 2017-04-13 DIAGNOSIS — Z853 Personal history of malignant neoplasm of breast: Secondary | ICD-10-CM | POA: Diagnosis not present

## 2017-04-13 DIAGNOSIS — D630 Anemia in neoplastic disease: Secondary | ICD-10-CM | POA: Diagnosis not present

## 2017-04-13 DIAGNOSIS — G620 Drug-induced polyneuropathy: Secondary | ICD-10-CM | POA: Diagnosis not present

## 2017-04-13 DIAGNOSIS — J90 Pleural effusion, not elsewhere classified: Secondary | ICD-10-CM | POA: Diagnosis not present

## 2017-04-13 DIAGNOSIS — Z95828 Presence of other vascular implants and grafts: Secondary | ICD-10-CM | POA: Diagnosis not present

## 2017-04-13 DIAGNOSIS — E538 Deficiency of other specified B group vitamins: Secondary | ICD-10-CM | POA: Diagnosis not present

## 2017-04-13 DIAGNOSIS — C787 Secondary malignant neoplasm of liver and intrahepatic bile duct: Secondary | ICD-10-CM | POA: Diagnosis not present

## 2017-04-13 DIAGNOSIS — Z79899 Other long term (current) drug therapy: Secondary | ICD-10-CM | POA: Diagnosis not present

## 2017-04-13 DIAGNOSIS — C7802 Secondary malignant neoplasm of left lung: Secondary | ICD-10-CM | POA: Diagnosis not present

## 2017-04-13 DIAGNOSIS — C778 Secondary and unspecified malignant neoplasm of lymph nodes of multiple regions: Secondary | ICD-10-CM | POA: Diagnosis not present

## 2017-04-14 ENCOUNTER — Other Ambulatory Visit (HOSPITAL_COMMUNITY): Payer: Self-pay | Admitting: Hematology & Oncology

## 2017-04-17 DIAGNOSIS — C7951 Secondary malignant neoplasm of bone: Secondary | ICD-10-CM | POA: Diagnosis not present

## 2017-04-17 DIAGNOSIS — C50919 Malignant neoplasm of unspecified site of unspecified female breast: Secondary | ICD-10-CM | POA: Diagnosis not present

## 2017-04-17 DIAGNOSIS — Z79899 Other long term (current) drug therapy: Secondary | ICD-10-CM | POA: Diagnosis not present

## 2017-04-17 DIAGNOSIS — Z5112 Encounter for antineoplastic immunotherapy: Secondary | ICD-10-CM | POA: Diagnosis not present

## 2017-04-18 ENCOUNTER — Other Ambulatory Visit (HOSPITAL_COMMUNITY): Payer: Self-pay | Admitting: Hematology & Oncology

## 2017-04-18 DIAGNOSIS — C50919 Malignant neoplasm of unspecified site of unspecified female breast: Secondary | ICD-10-CM

## 2017-04-24 ENCOUNTER — Other Ambulatory Visit (HOSPITAL_COMMUNITY): Payer: Self-pay | Admitting: Hematology & Oncology

## 2017-04-24 DIAGNOSIS — C50919 Malignant neoplasm of unspecified site of unspecified female breast: Secondary | ICD-10-CM

## 2017-04-25 NOTE — Telephone Encounter (Signed)
Debbie Reyes,   She has transferred her care to [facility]. We should not fill any more of her prescriptions we were previously providing for her. She should get those from her PCP or her new oncology team.   Thanks! Mike Craze, NP [place] [phone]

## 2017-04-26 DIAGNOSIS — Z23 Encounter for immunization: Secondary | ICD-10-CM | POA: Diagnosis not present

## 2017-05-11 DIAGNOSIS — F329 Major depressive disorder, single episode, unspecified: Secondary | ICD-10-CM | POA: Diagnosis not present

## 2017-05-11 DIAGNOSIS — Z79899 Other long term (current) drug therapy: Secondary | ICD-10-CM | POA: Diagnosis not present

## 2017-05-11 DIAGNOSIS — C7951 Secondary malignant neoplasm of bone: Secondary | ICD-10-CM | POA: Diagnosis not present

## 2017-05-11 DIAGNOSIS — G629 Polyneuropathy, unspecified: Secondary | ICD-10-CM | POA: Diagnosis not present

## 2017-05-11 DIAGNOSIS — E538 Deficiency of other specified B group vitamins: Secondary | ICD-10-CM | POA: Diagnosis not present

## 2017-05-11 DIAGNOSIS — Z923 Personal history of irradiation: Secondary | ICD-10-CM | POA: Diagnosis not present

## 2017-05-11 DIAGNOSIS — Z170 Estrogen receptor positive status [ER+]: Secondary | ICD-10-CM | POA: Diagnosis not present

## 2017-05-11 DIAGNOSIS — R7989 Other specified abnormal findings of blood chemistry: Secondary | ICD-10-CM | POA: Diagnosis not present

## 2017-05-11 DIAGNOSIS — R5383 Other fatigue: Secondary | ICD-10-CM | POA: Diagnosis not present

## 2017-05-11 DIAGNOSIS — C50919 Malignant neoplasm of unspecified site of unspecified female breast: Secondary | ICD-10-CM | POA: Diagnosis not present

## 2017-05-11 DIAGNOSIS — Z9221 Personal history of antineoplastic chemotherapy: Secondary | ICD-10-CM | POA: Diagnosis not present

## 2017-05-11 DIAGNOSIS — Z95828 Presence of other vascular implants and grafts: Secondary | ICD-10-CM | POA: Diagnosis not present

## 2017-05-11 DIAGNOSIS — C50912 Malignant neoplasm of unspecified site of left female breast: Secondary | ICD-10-CM | POA: Diagnosis not present

## 2017-05-11 DIAGNOSIS — I10 Essential (primary) hypertension: Secondary | ICD-10-CM | POA: Diagnosis not present

## 2017-05-11 DIAGNOSIS — F419 Anxiety disorder, unspecified: Secondary | ICD-10-CM | POA: Diagnosis not present

## 2017-05-15 DIAGNOSIS — Z5112 Encounter for antineoplastic immunotherapy: Secondary | ICD-10-CM | POA: Diagnosis not present

## 2017-05-15 DIAGNOSIS — C7951 Secondary malignant neoplasm of bone: Secondary | ICD-10-CM | POA: Diagnosis not present

## 2017-05-15 DIAGNOSIS — C50912 Malignant neoplasm of unspecified site of left female breast: Secondary | ICD-10-CM | POA: Diagnosis not present

## 2017-05-15 DIAGNOSIS — Z170 Estrogen receptor positive status [ER+]: Secondary | ICD-10-CM | POA: Diagnosis not present

## 2017-06-09 DIAGNOSIS — C50919 Malignant neoplasm of unspecified site of unspecified female breast: Secondary | ICD-10-CM | POA: Diagnosis not present

## 2017-06-09 DIAGNOSIS — E538 Deficiency of other specified B group vitamins: Secondary | ICD-10-CM | POA: Diagnosis not present

## 2017-06-09 DIAGNOSIS — I10 Essential (primary) hypertension: Secondary | ICD-10-CM | POA: Diagnosis not present

## 2017-06-09 DIAGNOSIS — J9811 Atelectasis: Secondary | ICD-10-CM | POA: Diagnosis not present

## 2017-06-09 DIAGNOSIS — Z9225 Personal history of immunosupression therapy: Secondary | ICD-10-CM | POA: Diagnosis not present

## 2017-06-09 DIAGNOSIS — C7989 Secondary malignant neoplasm of other specified sites: Secondary | ICD-10-CM | POA: Diagnosis not present

## 2017-06-09 DIAGNOSIS — Z170 Estrogen receptor positive status [ER+]: Secondary | ICD-10-CM | POA: Diagnosis not present

## 2017-06-09 DIAGNOSIS — R112 Nausea with vomiting, unspecified: Secondary | ICD-10-CM | POA: Diagnosis not present

## 2017-06-09 DIAGNOSIS — G893 Neoplasm related pain (acute) (chronic): Secondary | ICD-10-CM | POA: Diagnosis not present

## 2017-06-09 DIAGNOSIS — G629 Polyneuropathy, unspecified: Secondary | ICD-10-CM | POA: Diagnosis not present

## 2017-06-09 DIAGNOSIS — Z9221 Personal history of antineoplastic chemotherapy: Secondary | ICD-10-CM | POA: Diagnosis not present

## 2017-06-09 DIAGNOSIS — C772 Secondary and unspecified malignant neoplasm of intra-abdominal lymph nodes: Secondary | ICD-10-CM | POA: Diagnosis not present

## 2017-06-09 DIAGNOSIS — C50912 Malignant neoplasm of unspecified site of left female breast: Secondary | ICD-10-CM | POA: Diagnosis not present

## 2017-06-09 DIAGNOSIS — Z79891 Long term (current) use of opiate analgesic: Secondary | ICD-10-CM | POA: Diagnosis not present

## 2017-06-09 DIAGNOSIS — C787 Secondary malignant neoplasm of liver and intrahepatic bile duct: Secondary | ICD-10-CM | POA: Diagnosis not present

## 2017-06-09 DIAGNOSIS — Z9882 Breast implant status: Secondary | ICD-10-CM | POA: Diagnosis not present

## 2017-06-09 DIAGNOSIS — I700 Atherosclerosis of aorta: Secondary | ICD-10-CM | POA: Diagnosis not present

## 2017-06-09 DIAGNOSIS — C7951 Secondary malignant neoplasm of bone: Secondary | ICD-10-CM | POA: Diagnosis not present

## 2017-06-09 DIAGNOSIS — J90 Pleural effusion, not elsewhere classified: Secondary | ICD-10-CM | POA: Diagnosis not present

## 2017-06-09 DIAGNOSIS — J439 Emphysema, unspecified: Secondary | ICD-10-CM | POA: Diagnosis not present

## 2017-06-09 DIAGNOSIS — I2510 Atherosclerotic heart disease of native coronary artery without angina pectoris: Secondary | ICD-10-CM | POA: Diagnosis not present

## 2017-06-09 DIAGNOSIS — F329 Major depressive disorder, single episode, unspecified: Secondary | ICD-10-CM | POA: Diagnosis not present

## 2017-06-09 DIAGNOSIS — Z923 Personal history of irradiation: Secondary | ICD-10-CM | POA: Diagnosis not present

## 2017-06-09 DIAGNOSIS — D6489 Other specified anemias: Secondary | ICD-10-CM | POA: Diagnosis not present

## 2017-06-09 DIAGNOSIS — C7972 Secondary malignant neoplasm of left adrenal gland: Secondary | ICD-10-CM | POA: Diagnosis not present

## 2017-06-12 DIAGNOSIS — C7951 Secondary malignant neoplasm of bone: Secondary | ICD-10-CM | POA: Diagnosis not present

## 2017-06-12 DIAGNOSIS — C50919 Malignant neoplasm of unspecified site of unspecified female breast: Secondary | ICD-10-CM | POA: Diagnosis not present

## 2017-06-18 IMAGING — DX DG CHEST 2V
2 series · 2 of 2 positions shown · non-contrast
Comparison: PET-CT 06/27/16 and earlier

CLINICAL DATA: 75-year-old female with cough and congestion for 2
weeks. Metastatic breast cancer. Subsequent encounter.

EXAM:
CHEST  2 VIEW

[chest pa]
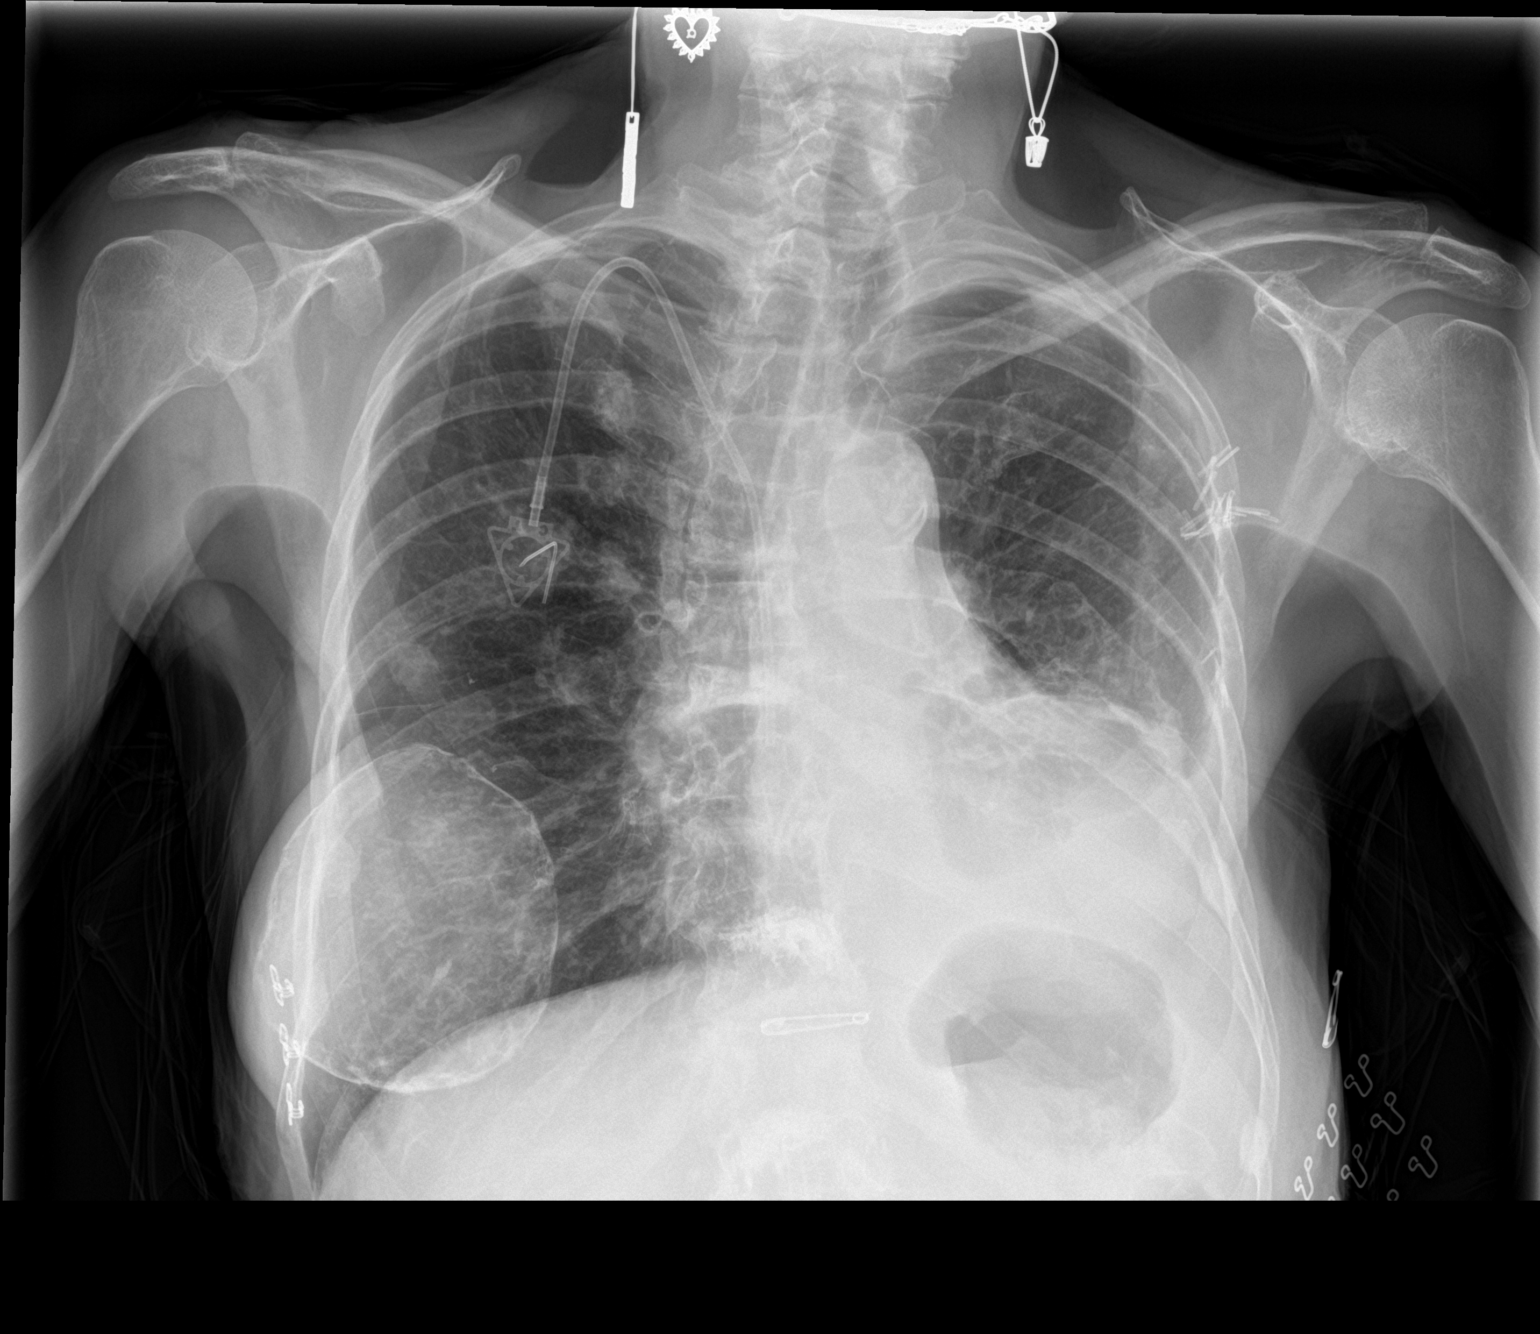

[chest lat]
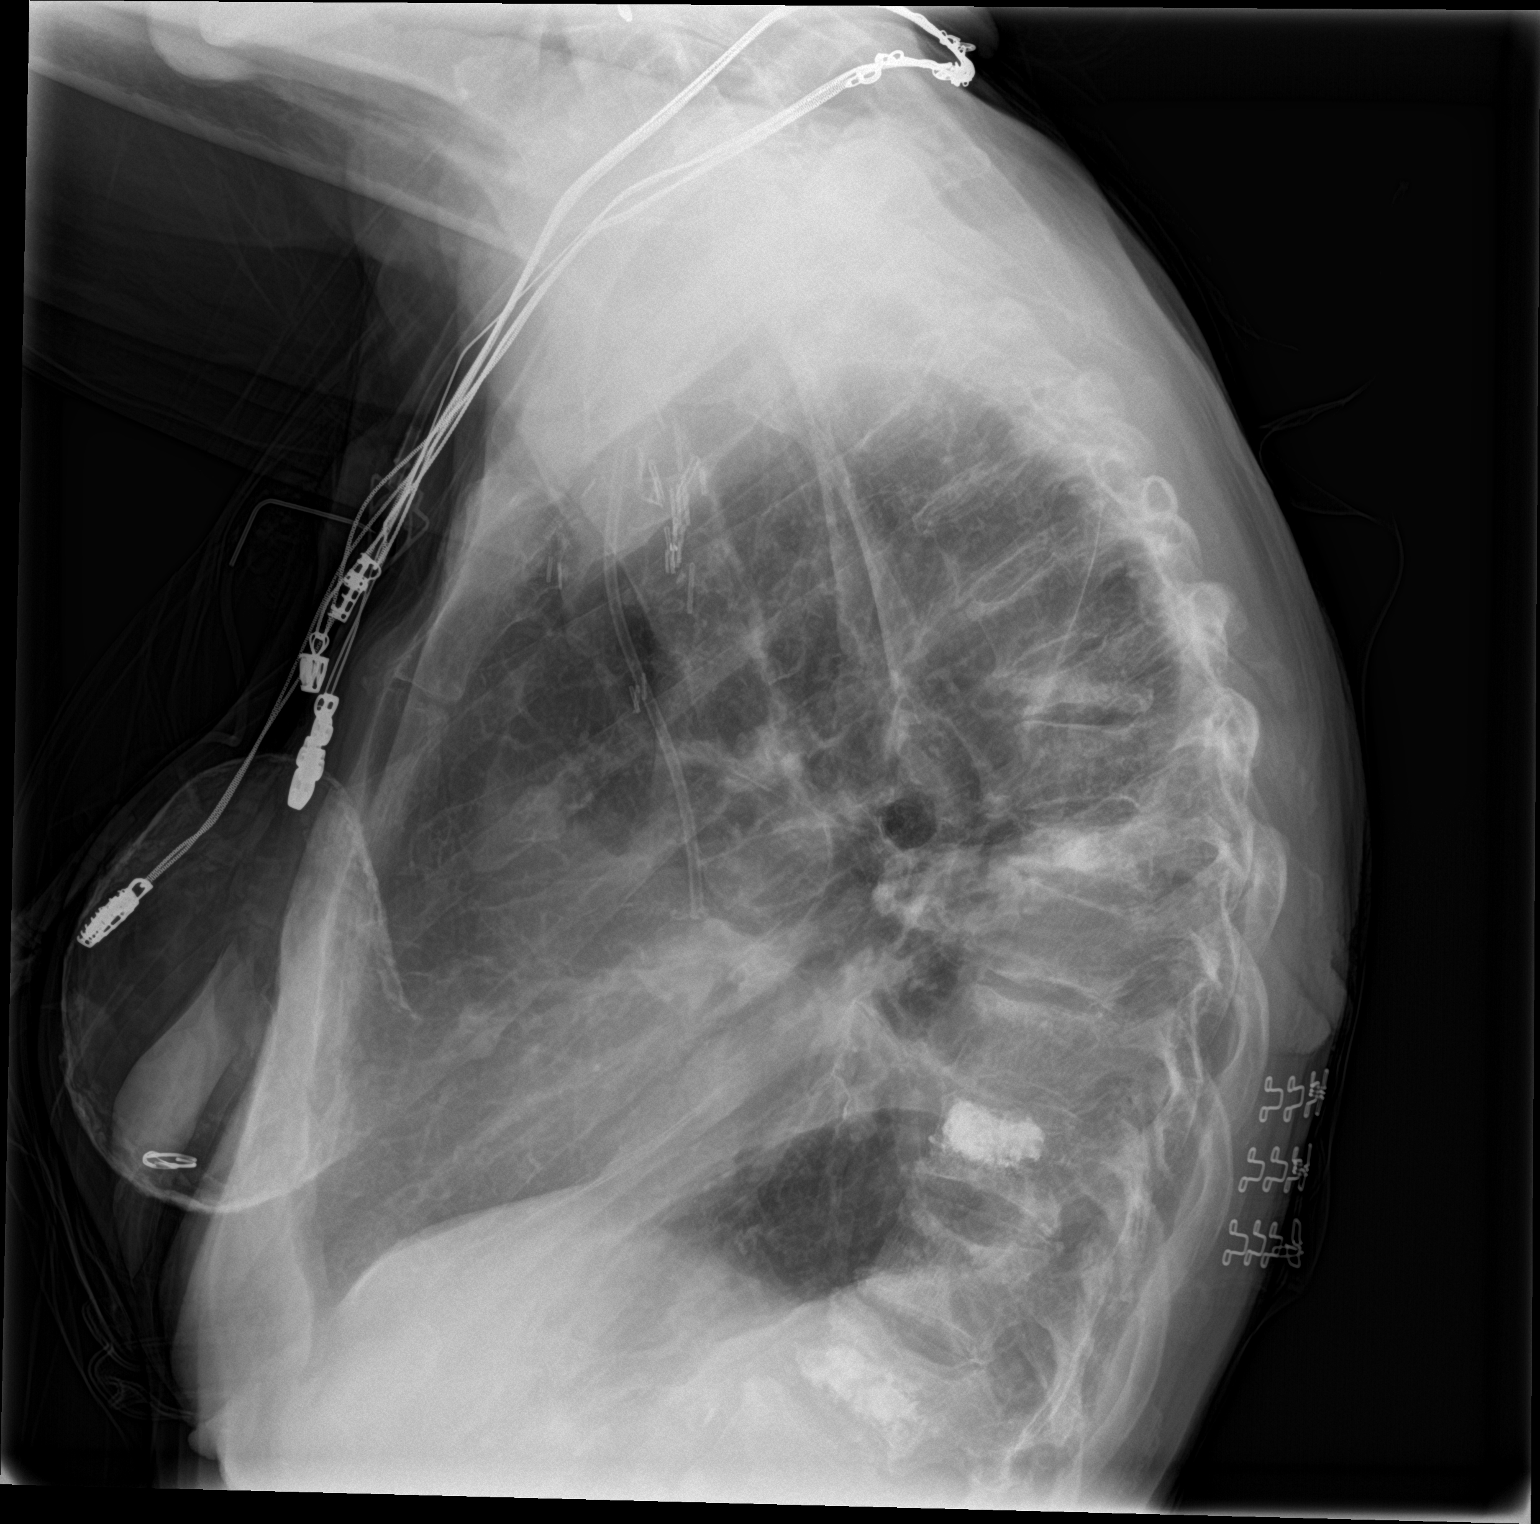

[2 of 2 positions shown; findings below may reference images not displayed]

FINDINGS: Stable right chest porta cath, currently accessed. Osteopenia with
widespread spinal compression fractures, most previously augmented.
Partially calcified right breast implant. Stable left axillary
surgical clips. Chronic posterior right rib fractures. Expansile
metastasis of the left seventh rib re- demonstrated. Stable
visualized osseous structures. Stable and negative visible bowel gas
pattern.

Stable right chest porta cath, currently accessed. Continued hypo
ventilation at the left lung base where loculated effusion and
airspace disease were demonstrated on the recent PET-CT. No
superimposed pneumothorax, pulmonary edema or new pulmonary opacity.
IMPRESSION: 1.  No new cardiopulmonary abnormality.
2. Loculated left pleural effusion and left lung base airspace
disease appears stable from the recent PET-CT.
3. Stable metastatic and chronic osseous findings.

## 2017-07-12 DIAGNOSIS — I10 Essential (primary) hypertension: Secondary | ICD-10-CM | POA: Diagnosis not present

## 2017-07-12 DIAGNOSIS — Z170 Estrogen receptor positive status [ER+]: Secondary | ICD-10-CM | POA: Diagnosis not present

## 2017-07-12 DIAGNOSIS — C7989 Secondary malignant neoplasm of other specified sites: Secondary | ICD-10-CM | POA: Diagnosis not present

## 2017-07-12 DIAGNOSIS — Z9221 Personal history of antineoplastic chemotherapy: Secondary | ICD-10-CM | POA: Diagnosis not present

## 2017-07-12 DIAGNOSIS — T50995S Adverse effect of other drugs, medicaments and biological substances, sequela: Secondary | ICD-10-CM | POA: Diagnosis not present

## 2017-07-12 DIAGNOSIS — Z79891 Long term (current) use of opiate analgesic: Secondary | ICD-10-CM | POA: Diagnosis not present

## 2017-07-12 DIAGNOSIS — C50912 Malignant neoplasm of unspecified site of left female breast: Secondary | ICD-10-CM | POA: Diagnosis not present

## 2017-07-12 DIAGNOSIS — H918X3 Other specified hearing loss, bilateral: Secondary | ICD-10-CM | POA: Diagnosis not present

## 2017-07-12 DIAGNOSIS — Z923 Personal history of irradiation: Secondary | ICD-10-CM | POA: Diagnosis not present

## 2017-07-12 DIAGNOSIS — J90 Pleural effusion, not elsewhere classified: Secondary | ICD-10-CM | POA: Diagnosis not present

## 2017-07-12 DIAGNOSIS — G620 Drug-induced polyneuropathy: Secondary | ICD-10-CM | POA: Diagnosis not present

## 2017-07-12 DIAGNOSIS — E538 Deficiency of other specified B group vitamins: Secondary | ICD-10-CM | POA: Diagnosis not present

## 2017-07-12 DIAGNOSIS — R590 Localized enlarged lymph nodes: Secondary | ICD-10-CM | POA: Diagnosis not present

## 2017-07-12 DIAGNOSIS — I700 Atherosclerosis of aorta: Secondary | ICD-10-CM | POA: Diagnosis not present

## 2017-07-12 DIAGNOSIS — C50919 Malignant neoplasm of unspecified site of unspecified female breast: Secondary | ICD-10-CM | POA: Diagnosis not present

## 2017-07-12 DIAGNOSIS — C7972 Secondary malignant neoplasm of left adrenal gland: Secondary | ICD-10-CM | POA: Diagnosis not present

## 2017-07-12 DIAGNOSIS — C7802 Secondary malignant neoplasm of left lung: Secondary | ICD-10-CM | POA: Diagnosis not present

## 2017-07-12 DIAGNOSIS — C787 Secondary malignant neoplasm of liver and intrahepatic bile duct: Secondary | ICD-10-CM | POA: Diagnosis not present

## 2017-07-12 DIAGNOSIS — F329 Major depressive disorder, single episode, unspecified: Secondary | ICD-10-CM | POA: Diagnosis not present

## 2017-07-12 DIAGNOSIS — C7801 Secondary malignant neoplasm of right lung: Secondary | ICD-10-CM | POA: Diagnosis not present

## 2017-07-12 DIAGNOSIS — Z9882 Breast implant status: Secondary | ICD-10-CM | POA: Diagnosis not present

## 2017-07-12 DIAGNOSIS — Z9225 Personal history of immunosupression therapy: Secondary | ICD-10-CM | POA: Diagnosis not present

## 2017-07-12 DIAGNOSIS — J439 Emphysema, unspecified: Secondary | ICD-10-CM | POA: Diagnosis not present

## 2017-07-12 DIAGNOSIS — C7951 Secondary malignant neoplasm of bone: Secondary | ICD-10-CM | POA: Diagnosis not present

## 2017-07-12 DIAGNOSIS — I2510 Atherosclerotic heart disease of native coronary artery without angina pectoris: Secondary | ICD-10-CM | POA: Diagnosis not present

## 2017-07-14 DIAGNOSIS — C7951 Secondary malignant neoplasm of bone: Secondary | ICD-10-CM | POA: Diagnosis not present

## 2017-07-14 DIAGNOSIS — C50919 Malignant neoplasm of unspecified site of unspecified female breast: Secondary | ICD-10-CM | POA: Diagnosis not present

## 2017-07-17 ENCOUNTER — Other Ambulatory Visit (HOSPITAL_COMMUNITY): Payer: Self-pay | Admitting: Hematology and Oncology

## 2017-07-17 DIAGNOSIS — Z853 Personal history of malignant neoplasm of breast: Secondary | ICD-10-CM

## 2017-07-31 ENCOUNTER — Ambulatory Visit (HOSPITAL_COMMUNITY)
Admission: RE | Admit: 2017-07-31 | Discharge: 2017-07-31 | Disposition: A | Discharge status: Home / Self Care | Payer: Medicare Other | Source: Ambulatory Visit | Attending: Hematology and Oncology | Admitting: Hematology and Oncology

## 2017-07-31 DIAGNOSIS — C50912 Malignant neoplasm of unspecified site of left female breast: Secondary | ICD-10-CM | POA: Insufficient documentation

## 2017-07-31 DIAGNOSIS — C7951 Secondary malignant neoplasm of bone: Secondary | ICD-10-CM | POA: Diagnosis not present

## 2017-07-31 DIAGNOSIS — C50919 Malignant neoplasm of unspecified site of unspecified female breast: Secondary | ICD-10-CM | POA: Insufficient documentation

## 2017-07-31 DIAGNOSIS — C50811 Malignant neoplasm of overlapping sites of right female breast: Secondary | ICD-10-CM | POA: Diagnosis not present

## 2017-07-31 DIAGNOSIS — Z170 Estrogen receptor positive status [ER+]: Secondary | ICD-10-CM | POA: Insufficient documentation

## 2017-07-31 DIAGNOSIS — C7800 Secondary malignant neoplasm of unspecified lung: Secondary | ICD-10-CM | POA: Insufficient documentation

## 2017-07-31 DIAGNOSIS — Z853 Personal history of malignant neoplasm of breast: Secondary | ICD-10-CM

## 2017-07-31 DIAGNOSIS — I700 Atherosclerosis of aorta: Secondary | ICD-10-CM | POA: Insufficient documentation

## 2017-07-31 DIAGNOSIS — I2510 Atherosclerotic heart disease of native coronary artery without angina pectoris: Secondary | ICD-10-CM | POA: Insufficient documentation

## 2017-07-31 DIAGNOSIS — C787 Secondary malignant neoplasm of liver and intrahepatic bile duct: Secondary | ICD-10-CM | POA: Diagnosis not present

## 2017-07-31 DIAGNOSIS — J90 Pleural effusion, not elsewhere classified: Secondary | ICD-10-CM | POA: Insufficient documentation

## 2017-07-31 DIAGNOSIS — R590 Localized enlarged lymph nodes: Secondary | ICD-10-CM | POA: Insufficient documentation

## 2017-07-31 LAB — GLUCOSE, CAPILLARY: Glucose-Capillary: 56 mg/dL — ABNORMAL LOW (ref 65–99)

## 2017-07-31 MED ORDER — FLUDEOXYGLUCOSE F - 18 (FDG) INJECTION
4.9000 | Freq: Once | INTRAVENOUS | Status: AC | PRN
  Administered 2017-07-31: 4.9 via INTRAVENOUS

## 2017-08-02 DIAGNOSIS — C50919 Malignant neoplasm of unspecified site of unspecified female breast: Secondary | ICD-10-CM | POA: Diagnosis not present

## 2017-08-02 DIAGNOSIS — C7951 Secondary malignant neoplasm of bone: Secondary | ICD-10-CM | POA: Diagnosis not present

## 2017-08-09 DIAGNOSIS — F329 Major depressive disorder, single episode, unspecified: Secondary | ICD-10-CM | POA: Diagnosis not present

## 2017-08-09 DIAGNOSIS — Z7982 Long term (current) use of aspirin: Secondary | ICD-10-CM | POA: Diagnosis not present

## 2017-08-09 DIAGNOSIS — Z79899 Other long term (current) drug therapy: Secondary | ICD-10-CM | POA: Diagnosis not present

## 2017-08-09 DIAGNOSIS — Z9221 Personal history of antineoplastic chemotherapy: Secondary | ICD-10-CM | POA: Diagnosis not present

## 2017-08-09 DIAGNOSIS — Z170 Estrogen receptor positive status [ER+]: Secondary | ICD-10-CM | POA: Diagnosis not present

## 2017-08-09 DIAGNOSIS — C50912 Malignant neoplasm of unspecified site of left female breast: Secondary | ICD-10-CM | POA: Diagnosis not present

## 2017-08-09 DIAGNOSIS — C787 Secondary malignant neoplasm of liver and intrahepatic bile duct: Secondary | ICD-10-CM | POA: Diagnosis not present

## 2017-08-09 DIAGNOSIS — C7800 Secondary malignant neoplasm of unspecified lung: Secondary | ICD-10-CM | POA: Diagnosis not present

## 2017-08-09 DIAGNOSIS — R5383 Other fatigue: Secondary | ICD-10-CM | POA: Diagnosis not present

## 2017-08-09 DIAGNOSIS — C7951 Secondary malignant neoplasm of bone: Secondary | ICD-10-CM | POA: Diagnosis not present

## 2017-08-09 DIAGNOSIS — Z923 Personal history of irradiation: Secondary | ICD-10-CM | POA: Diagnosis not present

## 2017-08-09 DIAGNOSIS — I2510 Atherosclerotic heart disease of native coronary artery without angina pectoris: Secondary | ICD-10-CM | POA: Diagnosis not present

## 2017-08-09 DIAGNOSIS — C778 Secondary and unspecified malignant neoplasm of lymph nodes of multiple regions: Secondary | ICD-10-CM | POA: Diagnosis not present

## 2017-08-09 DIAGNOSIS — C50919 Malignant neoplasm of unspecified site of unspecified female breast: Secondary | ICD-10-CM | POA: Diagnosis not present

## 2017-08-09 DIAGNOSIS — J90 Pleural effusion, not elsewhere classified: Secondary | ICD-10-CM | POA: Diagnosis not present

## 2017-08-09 DIAGNOSIS — Z634 Disappearance and death of family member: Secondary | ICD-10-CM | POA: Diagnosis not present

## 2017-08-09 DIAGNOSIS — E538 Deficiency of other specified B group vitamins: Secondary | ICD-10-CM | POA: Diagnosis not present

## 2017-08-11 DIAGNOSIS — C7951 Secondary malignant neoplasm of bone: Secondary | ICD-10-CM | POA: Diagnosis not present

## 2017-08-11 DIAGNOSIS — C50919 Malignant neoplasm of unspecified site of unspecified female breast: Secondary | ICD-10-CM | POA: Diagnosis not present

## 2017-08-30 DIAGNOSIS — C7951 Secondary malignant neoplasm of bone: Secondary | ICD-10-CM | POA: Diagnosis not present

## 2017-08-30 DIAGNOSIS — E538 Deficiency of other specified B group vitamins: Secondary | ICD-10-CM | POA: Diagnosis not present

## 2017-08-30 DIAGNOSIS — C7800 Secondary malignant neoplasm of unspecified lung: Secondary | ICD-10-CM | POA: Diagnosis not present

## 2017-08-30 DIAGNOSIS — I700 Atherosclerosis of aorta: Secondary | ICD-10-CM | POA: Diagnosis not present

## 2017-08-30 DIAGNOSIS — F329 Major depressive disorder, single episode, unspecified: Secondary | ICD-10-CM | POA: Diagnosis not present

## 2017-08-30 DIAGNOSIS — Z974 Presence of external hearing-aid: Secondary | ICD-10-CM | POA: Diagnosis not present

## 2017-08-30 DIAGNOSIS — Z79810 Long term (current) use of selective estrogen receptor modulators (SERMs): Secondary | ICD-10-CM | POA: Diagnosis not present

## 2017-08-30 DIAGNOSIS — Z170 Estrogen receptor positive status [ER+]: Secondary | ICD-10-CM | POA: Diagnosis not present

## 2017-08-30 DIAGNOSIS — H9193 Unspecified hearing loss, bilateral: Secondary | ICD-10-CM | POA: Diagnosis not present

## 2017-08-30 DIAGNOSIS — C787 Secondary malignant neoplasm of liver and intrahepatic bile duct: Secondary | ICD-10-CM | POA: Diagnosis not present

## 2017-08-30 DIAGNOSIS — I2510 Atherosclerotic heart disease of native coronary artery without angina pectoris: Secondary | ICD-10-CM | POA: Diagnosis not present

## 2017-08-30 DIAGNOSIS — Z7952 Long term (current) use of systemic steroids: Secondary | ICD-10-CM | POA: Diagnosis not present

## 2017-08-30 DIAGNOSIS — Z79899 Other long term (current) drug therapy: Secondary | ICD-10-CM | POA: Diagnosis not present

## 2017-08-30 DIAGNOSIS — Z7982 Long term (current) use of aspirin: Secondary | ICD-10-CM | POA: Diagnosis not present

## 2017-08-30 DIAGNOSIS — I10 Essential (primary) hypertension: Secondary | ICD-10-CM | POA: Diagnosis not present

## 2017-08-30 DIAGNOSIS — C50911 Malignant neoplasm of unspecified site of right female breast: Secondary | ICD-10-CM | POA: Diagnosis not present

## 2017-08-30 DIAGNOSIS — C786 Secondary malignant neoplasm of retroperitoneum and peritoneum: Secondary | ICD-10-CM | POA: Diagnosis not present

## 2017-08-30 DIAGNOSIS — J90 Pleural effusion, not elsewhere classified: Secondary | ICD-10-CM | POA: Diagnosis not present

## 2017-08-30 DIAGNOSIS — C50912 Malignant neoplasm of unspecified site of left female breast: Secondary | ICD-10-CM | POA: Diagnosis not present

## 2017-08-30 DIAGNOSIS — D649 Anemia, unspecified: Secondary | ICD-10-CM | POA: Diagnosis not present

## 2017-09-05 DIAGNOSIS — C50912 Malignant neoplasm of unspecified site of left female breast: Secondary | ICD-10-CM | POA: Diagnosis not present

## 2017-09-05 DIAGNOSIS — C7951 Secondary malignant neoplasm of bone: Secondary | ICD-10-CM | POA: Diagnosis not present

## 2017-09-11 DIAGNOSIS — C7951 Secondary malignant neoplasm of bone: Secondary | ICD-10-CM | POA: Diagnosis not present

## 2017-10-02 DIAGNOSIS — Z7952 Long term (current) use of systemic steroids: Secondary | ICD-10-CM | POA: Diagnosis not present

## 2017-10-02 DIAGNOSIS — D649 Anemia, unspecified: Secondary | ICD-10-CM | POA: Diagnosis not present

## 2017-10-02 DIAGNOSIS — Z923 Personal history of irradiation: Secondary | ICD-10-CM | POA: Diagnosis not present

## 2017-10-02 DIAGNOSIS — Z170 Estrogen receptor positive status [ER+]: Secondary | ICD-10-CM | POA: Diagnosis not present

## 2017-10-02 DIAGNOSIS — C772 Secondary and unspecified malignant neoplasm of intra-abdominal lymph nodes: Secondary | ICD-10-CM | POA: Diagnosis not present

## 2017-10-02 DIAGNOSIS — Z79899 Other long term (current) drug therapy: Secondary | ICD-10-CM | POA: Diagnosis not present

## 2017-10-02 DIAGNOSIS — C50919 Malignant neoplasm of unspecified site of unspecified female breast: Secondary | ICD-10-CM | POA: Diagnosis not present

## 2017-10-02 DIAGNOSIS — Z9221 Personal history of antineoplastic chemotherapy: Secondary | ICD-10-CM | POA: Diagnosis not present

## 2017-10-02 DIAGNOSIS — E538 Deficiency of other specified B group vitamins: Secondary | ICD-10-CM | POA: Diagnosis not present

## 2017-10-02 DIAGNOSIS — C787 Secondary malignant neoplasm of liver and intrahepatic bile duct: Secondary | ICD-10-CM | POA: Diagnosis not present

## 2017-10-02 DIAGNOSIS — C50911 Malignant neoplasm of unspecified site of right female breast: Secondary | ICD-10-CM | POA: Diagnosis not present

## 2017-10-02 DIAGNOSIS — F329 Major depressive disorder, single episode, unspecified: Secondary | ICD-10-CM | POA: Diagnosis not present

## 2017-10-02 DIAGNOSIS — Z79810 Long term (current) use of selective estrogen receptor modulators (SERMs): Secondary | ICD-10-CM | POA: Diagnosis not present

## 2017-10-02 DIAGNOSIS — C7800 Secondary malignant neoplasm of unspecified lung: Secondary | ICD-10-CM | POA: Diagnosis not present

## 2017-10-02 DIAGNOSIS — C50912 Malignant neoplasm of unspecified site of left female breast: Secondary | ICD-10-CM | POA: Diagnosis not present

## 2017-10-02 DIAGNOSIS — C7951 Secondary malignant neoplasm of bone: Secondary | ICD-10-CM | POA: Diagnosis not present

## 2017-10-09 DIAGNOSIS — C50919 Malignant neoplasm of unspecified site of unspecified female breast: Secondary | ICD-10-CM | POA: Diagnosis not present

## 2017-10-09 DIAGNOSIS — C7951 Secondary malignant neoplasm of bone: Secondary | ICD-10-CM | POA: Diagnosis not present

## 2017-10-18 DIAGNOSIS — C7951 Secondary malignant neoplasm of bone: Secondary | ICD-10-CM | POA: Diagnosis not present

## 2017-10-18 DIAGNOSIS — R9389 Abnormal findings on diagnostic imaging of other specified body structures: Secondary | ICD-10-CM | POA: Diagnosis not present

## 2017-10-18 DIAGNOSIS — C50512 Malignant neoplasm of lower-outer quadrant of left female breast: Secondary | ICD-10-CM | POA: Diagnosis not present

## 2017-10-18 DIAGNOSIS — R009 Unspecified abnormalities of heart beat: Secondary | ICD-10-CM | POA: Diagnosis not present

## 2017-10-18 DIAGNOSIS — C50912 Malignant neoplasm of unspecified site of left female breast: Secondary | ICD-10-CM | POA: Diagnosis not present

## 2017-10-18 DIAGNOSIS — C50919 Malignant neoplasm of unspecified site of unspecified female breast: Secondary | ICD-10-CM | POA: Diagnosis not present

## 2017-10-18 DIAGNOSIS — Z170 Estrogen receptor positive status [ER+]: Secondary | ICD-10-CM | POA: Diagnosis not present

## 2017-10-18 DIAGNOSIS — C50612 Malignant neoplasm of axillary tail of left female breast: Secondary | ICD-10-CM | POA: Diagnosis not present

## 2017-10-18 DIAGNOSIS — C50811 Malignant neoplasm of overlapping sites of right female breast: Secondary | ICD-10-CM | POA: Diagnosis not present

## 2017-10-20 DIAGNOSIS — C7989 Secondary malignant neoplasm of other specified sites: Secondary | ICD-10-CM | POA: Diagnosis not present

## 2017-10-20 DIAGNOSIS — C7801 Secondary malignant neoplasm of right lung: Secondary | ICD-10-CM | POA: Diagnosis not present

## 2017-10-20 DIAGNOSIS — C50919 Malignant neoplasm of unspecified site of unspecified female breast: Secondary | ICD-10-CM | POA: Diagnosis not present

## 2017-10-20 DIAGNOSIS — Z170 Estrogen receptor positive status [ER+]: Secondary | ICD-10-CM | POA: Diagnosis not present

## 2017-10-20 DIAGNOSIS — I700 Atherosclerosis of aorta: Secondary | ICD-10-CM | POA: Diagnosis not present

## 2017-10-20 DIAGNOSIS — C782 Secondary malignant neoplasm of pleura: Secondary | ICD-10-CM | POA: Diagnosis not present

## 2017-10-20 DIAGNOSIS — J90 Pleural effusion, not elsewhere classified: Secondary | ICD-10-CM | POA: Diagnosis not present

## 2017-10-20 DIAGNOSIS — J9811 Atelectasis: Secondary | ICD-10-CM | POA: Diagnosis not present

## 2017-10-20 DIAGNOSIS — C50912 Malignant neoplasm of unspecified site of left female breast: Secondary | ICD-10-CM | POA: Diagnosis not present

## 2017-10-20 DIAGNOSIS — C50911 Malignant neoplasm of unspecified site of right female breast: Secondary | ICD-10-CM | POA: Diagnosis not present

## 2017-10-20 DIAGNOSIS — C7951 Secondary malignant neoplasm of bone: Secondary | ICD-10-CM | POA: Diagnosis not present

## 2017-10-20 DIAGNOSIS — R009 Unspecified abnormalities of heart beat: Secondary | ICD-10-CM | POA: Diagnosis not present

## 2017-10-20 DIAGNOSIS — J439 Emphysema, unspecified: Secondary | ICD-10-CM | POA: Diagnosis not present

## 2017-10-25 DIAGNOSIS — C50919 Malignant neoplasm of unspecified site of unspecified female breast: Secondary | ICD-10-CM | POA: Diagnosis not present

## 2017-10-26 DIAGNOSIS — J90 Pleural effusion, not elsewhere classified: Secondary | ICD-10-CM | POA: Diagnosis not present

## 2017-10-26 DIAGNOSIS — C50919 Malignant neoplasm of unspecified site of unspecified female breast: Secondary | ICD-10-CM | POA: Diagnosis not present

## 2017-11-01 DIAGNOSIS — R531 Weakness: Secondary | ICD-10-CM | POA: Diagnosis not present

## 2017-11-01 DIAGNOSIS — R0602 Shortness of breath: Secondary | ICD-10-CM | POA: Diagnosis not present

## 2017-11-01 DIAGNOSIS — I10 Essential (primary) hypertension: Secondary | ICD-10-CM | POA: Diagnosis not present

## 2017-11-01 DIAGNOSIS — D519 Vitamin B12 deficiency anemia, unspecified: Secondary | ICD-10-CM | POA: Diagnosis not present

## 2017-11-01 DIAGNOSIS — R52 Pain, unspecified: Secondary | ICD-10-CM | POA: Diagnosis not present

## 2017-11-01 DIAGNOSIS — C50919 Malignant neoplasm of unspecified site of unspecified female breast: Secondary | ICD-10-CM | POA: Diagnosis not present

## 2017-11-03 DIAGNOSIS — R0602 Shortness of breath: Secondary | ICD-10-CM | POA: Diagnosis not present

## 2017-11-03 DIAGNOSIS — R531 Weakness: Secondary | ICD-10-CM | POA: Diagnosis not present

## 2017-11-03 DIAGNOSIS — I10 Essential (primary) hypertension: Secondary | ICD-10-CM | POA: Diagnosis not present

## 2017-11-03 DIAGNOSIS — D519 Vitamin B12 deficiency anemia, unspecified: Secondary | ICD-10-CM | POA: Diagnosis not present

## 2017-11-03 DIAGNOSIS — R52 Pain, unspecified: Secondary | ICD-10-CM | POA: Diagnosis not present

## 2017-11-03 DIAGNOSIS — C50919 Malignant neoplasm of unspecified site of unspecified female breast: Secondary | ICD-10-CM | POA: Diagnosis not present

## 2017-11-04 DIAGNOSIS — R0602 Shortness of breath: Secondary | ICD-10-CM | POA: Diagnosis not present

## 2017-11-04 DIAGNOSIS — I10 Essential (primary) hypertension: Secondary | ICD-10-CM | POA: Diagnosis not present

## 2017-11-04 DIAGNOSIS — R52 Pain, unspecified: Secondary | ICD-10-CM | POA: Diagnosis not present

## 2017-11-04 DIAGNOSIS — D519 Vitamin B12 deficiency anemia, unspecified: Secondary | ICD-10-CM | POA: Diagnosis not present

## 2017-11-04 DIAGNOSIS — C50919 Malignant neoplasm of unspecified site of unspecified female breast: Secondary | ICD-10-CM | POA: Diagnosis not present

## 2017-11-04 DIAGNOSIS — R531 Weakness: Secondary | ICD-10-CM | POA: Diagnosis not present

## 2017-11-06 DIAGNOSIS — I10 Essential (primary) hypertension: Secondary | ICD-10-CM | POA: Diagnosis not present

## 2017-11-06 DIAGNOSIS — C50919 Malignant neoplasm of unspecified site of unspecified female breast: Secondary | ICD-10-CM | POA: Diagnosis not present

## 2017-11-06 DIAGNOSIS — R531 Weakness: Secondary | ICD-10-CM | POA: Diagnosis not present

## 2017-11-06 DIAGNOSIS — R0602 Shortness of breath: Secondary | ICD-10-CM | POA: Diagnosis not present

## 2017-11-06 DIAGNOSIS — R52 Pain, unspecified: Secondary | ICD-10-CM | POA: Diagnosis not present

## 2017-11-06 DIAGNOSIS — D519 Vitamin B12 deficiency anemia, unspecified: Secondary | ICD-10-CM | POA: Diagnosis not present

## 2017-11-08 DIAGNOSIS — R52 Pain, unspecified: Secondary | ICD-10-CM | POA: Diagnosis not present

## 2017-11-08 DIAGNOSIS — R531 Weakness: Secondary | ICD-10-CM | POA: Diagnosis not present

## 2017-11-08 DIAGNOSIS — I10 Essential (primary) hypertension: Secondary | ICD-10-CM | POA: Diagnosis not present

## 2017-11-08 DIAGNOSIS — D519 Vitamin B12 deficiency anemia, unspecified: Secondary | ICD-10-CM | POA: Diagnosis not present

## 2017-11-08 DIAGNOSIS — C50919 Malignant neoplasm of unspecified site of unspecified female breast: Secondary | ICD-10-CM | POA: Diagnosis not present

## 2017-11-08 DIAGNOSIS — R0602 Shortness of breath: Secondary | ICD-10-CM | POA: Diagnosis not present

## 2017-11-09 DIAGNOSIS — R531 Weakness: Secondary | ICD-10-CM | POA: Diagnosis not present

## 2017-11-09 DIAGNOSIS — D519 Vitamin B12 deficiency anemia, unspecified: Secondary | ICD-10-CM | POA: Diagnosis not present

## 2017-11-09 DIAGNOSIS — C50919 Malignant neoplasm of unspecified site of unspecified female breast: Secondary | ICD-10-CM | POA: Diagnosis not present

## 2017-11-09 DIAGNOSIS — R52 Pain, unspecified: Secondary | ICD-10-CM | POA: Diagnosis not present

## 2017-11-09 DIAGNOSIS — I10 Essential (primary) hypertension: Secondary | ICD-10-CM | POA: Diagnosis not present

## 2017-11-09 DIAGNOSIS — R0602 Shortness of breath: Secondary | ICD-10-CM | POA: Diagnosis not present

## 2017-11-10 DIAGNOSIS — R52 Pain, unspecified: Secondary | ICD-10-CM | POA: Diagnosis not present

## 2017-11-10 DIAGNOSIS — C50919 Malignant neoplasm of unspecified site of unspecified female breast: Secondary | ICD-10-CM | POA: Diagnosis not present

## 2017-11-10 DIAGNOSIS — R531 Weakness: Secondary | ICD-10-CM | POA: Diagnosis not present

## 2017-11-10 DIAGNOSIS — I10 Essential (primary) hypertension: Secondary | ICD-10-CM | POA: Diagnosis not present

## 2017-11-10 DIAGNOSIS — R0602 Shortness of breath: Secondary | ICD-10-CM | POA: Diagnosis not present

## 2017-11-10 DIAGNOSIS — D519 Vitamin B12 deficiency anemia, unspecified: Secondary | ICD-10-CM | POA: Diagnosis not present

## 2017-11-11 DIAGNOSIS — R531 Weakness: Secondary | ICD-10-CM | POA: Diagnosis not present

## 2017-11-11 DIAGNOSIS — D519 Vitamin B12 deficiency anemia, unspecified: Secondary | ICD-10-CM | POA: Diagnosis not present

## 2017-11-11 DIAGNOSIS — C50919 Malignant neoplasm of unspecified site of unspecified female breast: Secondary | ICD-10-CM | POA: Diagnosis not present

## 2017-11-11 DIAGNOSIS — I10 Essential (primary) hypertension: Secondary | ICD-10-CM | POA: Diagnosis not present

## 2017-11-11 DIAGNOSIS — R0602 Shortness of breath: Secondary | ICD-10-CM | POA: Diagnosis not present

## 2017-11-11 DIAGNOSIS — R52 Pain, unspecified: Secondary | ICD-10-CM | POA: Diagnosis not present

## 2017-11-12 DIAGNOSIS — R0602 Shortness of breath: Secondary | ICD-10-CM | POA: Diagnosis not present

## 2017-11-12 DIAGNOSIS — C50919 Malignant neoplasm of unspecified site of unspecified female breast: Secondary | ICD-10-CM | POA: Diagnosis not present

## 2017-11-12 DIAGNOSIS — R531 Weakness: Secondary | ICD-10-CM | POA: Diagnosis not present

## 2017-11-12 DIAGNOSIS — I10 Essential (primary) hypertension: Secondary | ICD-10-CM | POA: Diagnosis not present

## 2017-11-12 DIAGNOSIS — R52 Pain, unspecified: Secondary | ICD-10-CM | POA: Diagnosis not present

## 2017-11-12 DIAGNOSIS — D519 Vitamin B12 deficiency anemia, unspecified: Secondary | ICD-10-CM | POA: Diagnosis not present

## 2017-11-13 DIAGNOSIS — R0602 Shortness of breath: Secondary | ICD-10-CM | POA: Diagnosis not present

## 2017-11-13 DIAGNOSIS — C50919 Malignant neoplasm of unspecified site of unspecified female breast: Secondary | ICD-10-CM | POA: Diagnosis not present

## 2017-11-13 DIAGNOSIS — R531 Weakness: Secondary | ICD-10-CM | POA: Diagnosis not present

## 2017-11-13 DIAGNOSIS — R52 Pain, unspecified: Secondary | ICD-10-CM | POA: Diagnosis not present

## 2017-11-13 DIAGNOSIS — D519 Vitamin B12 deficiency anemia, unspecified: Secondary | ICD-10-CM | POA: Diagnosis not present

## 2017-11-13 DIAGNOSIS — I10 Essential (primary) hypertension: Secondary | ICD-10-CM | POA: Diagnosis not present

## 2017-11-14 DIAGNOSIS — D519 Vitamin B12 deficiency anemia, unspecified: Secondary | ICD-10-CM | POA: Diagnosis not present

## 2017-11-14 DIAGNOSIS — R0602 Shortness of breath: Secondary | ICD-10-CM | POA: Diagnosis not present

## 2017-11-14 DIAGNOSIS — R52 Pain, unspecified: Secondary | ICD-10-CM | POA: Diagnosis not present

## 2017-11-14 DIAGNOSIS — R531 Weakness: Secondary | ICD-10-CM | POA: Diagnosis not present

## 2017-11-14 DIAGNOSIS — C50919 Malignant neoplasm of unspecified site of unspecified female breast: Secondary | ICD-10-CM | POA: Diagnosis not present

## 2017-11-14 DIAGNOSIS — I10 Essential (primary) hypertension: Secondary | ICD-10-CM | POA: Diagnosis not present

## 2017-11-15 DIAGNOSIS — R52 Pain, unspecified: Secondary | ICD-10-CM | POA: Diagnosis not present

## 2017-11-15 DIAGNOSIS — C50919 Malignant neoplasm of unspecified site of unspecified female breast: Secondary | ICD-10-CM | POA: Diagnosis not present

## 2017-11-15 DIAGNOSIS — D519 Vitamin B12 deficiency anemia, unspecified: Secondary | ICD-10-CM | POA: Diagnosis not present

## 2017-11-15 DIAGNOSIS — I10 Essential (primary) hypertension: Secondary | ICD-10-CM | POA: Diagnosis not present

## 2017-11-15 DIAGNOSIS — R531 Weakness: Secondary | ICD-10-CM | POA: Diagnosis not present

## 2017-11-15 DIAGNOSIS — R0602 Shortness of breath: Secondary | ICD-10-CM | POA: Diagnosis not present

## 2017-11-16 DIAGNOSIS — D519 Vitamin B12 deficiency anemia, unspecified: Secondary | ICD-10-CM | POA: Diagnosis not present

## 2017-11-16 DIAGNOSIS — R531 Weakness: Secondary | ICD-10-CM | POA: Diagnosis not present

## 2017-11-16 DIAGNOSIS — I10 Essential (primary) hypertension: Secondary | ICD-10-CM | POA: Diagnosis not present

## 2017-11-16 DIAGNOSIS — R52 Pain, unspecified: Secondary | ICD-10-CM | POA: Diagnosis not present

## 2017-11-16 DIAGNOSIS — R0602 Shortness of breath: Secondary | ICD-10-CM | POA: Diagnosis not present

## 2017-11-16 DIAGNOSIS — C50919 Malignant neoplasm of unspecified site of unspecified female breast: Secondary | ICD-10-CM | POA: Diagnosis not present

## 2017-11-17 DIAGNOSIS — R531 Weakness: Secondary | ICD-10-CM | POA: Diagnosis not present

## 2017-11-17 DIAGNOSIS — R0602 Shortness of breath: Secondary | ICD-10-CM | POA: Diagnosis not present

## 2017-11-17 DIAGNOSIS — I10 Essential (primary) hypertension: Secondary | ICD-10-CM | POA: Diagnosis not present

## 2017-11-17 DIAGNOSIS — R52 Pain, unspecified: Secondary | ICD-10-CM | POA: Diagnosis not present

## 2017-11-17 DIAGNOSIS — C50919 Malignant neoplasm of unspecified site of unspecified female breast: Secondary | ICD-10-CM | POA: Diagnosis not present

## 2017-11-17 DIAGNOSIS — D519 Vitamin B12 deficiency anemia, unspecified: Secondary | ICD-10-CM | POA: Diagnosis not present

## 2017-11-18 DIAGNOSIS — C50919 Malignant neoplasm of unspecified site of unspecified female breast: Secondary | ICD-10-CM | POA: Diagnosis not present

## 2017-11-18 DIAGNOSIS — I10 Essential (primary) hypertension: Secondary | ICD-10-CM | POA: Diagnosis not present

## 2017-11-18 DIAGNOSIS — R531 Weakness: Secondary | ICD-10-CM | POA: Diagnosis not present

## 2017-11-18 DIAGNOSIS — R0602 Shortness of breath: Secondary | ICD-10-CM | POA: Diagnosis not present

## 2017-11-18 DIAGNOSIS — D519 Vitamin B12 deficiency anemia, unspecified: Secondary | ICD-10-CM | POA: Diagnosis not present

## 2017-11-18 DIAGNOSIS — R52 Pain, unspecified: Secondary | ICD-10-CM | POA: Diagnosis not present

## 2017-12-09 DEATH — deceased
